# Patient Record
Sex: Female | Born: 1950
Health system: Southern US, Community
[De-identification: ages and names within clinical notes are randomized; demographics above are authoritative.]

## PROBLEM LIST (undated history)

## (undated) DIAGNOSIS — G4700 Insomnia, unspecified: Secondary | ICD-10-CM

## (undated) DIAGNOSIS — R011 Cardiac murmur, unspecified: Secondary | ICD-10-CM

## (undated) DIAGNOSIS — R112 Nausea with vomiting, unspecified: Secondary | ICD-10-CM

## (undated) DIAGNOSIS — K219 Gastro-esophageal reflux disease without esophagitis: Secondary | ICD-10-CM

## (undated) DIAGNOSIS — M1990 Unspecified osteoarthritis, unspecified site: Secondary | ICD-10-CM

## (undated) DIAGNOSIS — J45909 Unspecified asthma, uncomplicated: Secondary | ICD-10-CM

## (undated) DIAGNOSIS — I10 Essential (primary) hypertension: Secondary | ICD-10-CM

## (undated) DIAGNOSIS — E785 Hyperlipidemia, unspecified: Secondary | ICD-10-CM

## (undated) DIAGNOSIS — M8580 Other specified disorders of bone density and structure, unspecified site: Secondary | ICD-10-CM

## (undated) DIAGNOSIS — M810 Age-related osteoporosis without current pathological fracture: Secondary | ICD-10-CM

## (undated) DIAGNOSIS — C50919 Malignant neoplasm of unspecified site of unspecified female breast: Secondary | ICD-10-CM

## (undated) DIAGNOSIS — Z9889 Other specified postprocedural states: Secondary | ICD-10-CM

## (undated) DIAGNOSIS — Z8719 Personal history of other diseases of the digestive system: Secondary | ICD-10-CM

## (undated) DIAGNOSIS — L719 Rosacea, unspecified: Secondary | ICD-10-CM

## (undated) HISTORY — PX: ABDOMINAL HYSTERECTOMY: SHX81

## (undated) HISTORY — PX: OTHER SURGICAL HISTORY: SHX169

## (undated) HISTORY — PX: TONSILLECTOMY: SUR1361

## (undated) HISTORY — PX: COLONOSCOPY W/ POLYPECTOMY: SHX1380

---

## 1999-04-07 HISTORY — PX: BREAST LUMPECTOMY: SHX2

## 1999-04-23 ENCOUNTER — Other Ambulatory Visit: Admission: RE | Admit: 1999-04-23 | Discharge: 1999-04-23 | Payer: Self-pay | Admitting: *Deleted

## 1999-05-07 HISTORY — PX: BREAST LUMPECTOMY: SHX2

## 1999-06-01 ENCOUNTER — Encounter: Admission: RE | Admit: 1999-06-01 | Discharge: 1999-08-30 | Payer: Self-pay | Admitting: Radiation Oncology

## 1999-06-07 HISTORY — PX: OTHER SURGICAL HISTORY: SHX169

## 1999-12-01 ENCOUNTER — Encounter: Admission: RE | Admit: 1999-12-01 | Discharge: 2000-02-29 | Payer: Self-pay | Admitting: Radiation Oncology

## 2000-05-06 HISTORY — PX: PORT-A-CATH REMOVAL: SHX5289

## 2000-06-06 HISTORY — PX: OTHER SURGICAL HISTORY: SHX169

## 2004-05-03 ENCOUNTER — Ambulatory Visit: Payer: Self-pay | Admitting: Oncology

## 2005-04-27 ENCOUNTER — Ambulatory Visit: Payer: Self-pay | Admitting: Oncology

## 2007-12-03 ENCOUNTER — Encounter: Admission: RE | Admit: 2007-12-03 | Discharge: 2007-12-03 | Payer: Self-pay | Admitting: Gastroenterology

## 2010-06-27 ENCOUNTER — Encounter: Payer: Self-pay | Admitting: Podiatry

## 2010-07-07 HISTORY — PX: COLON SURGERY: SHX602

## 2012-05-08 ENCOUNTER — Telehealth: Payer: Self-pay

## 2012-05-08 ENCOUNTER — Other Ambulatory Visit: Payer: Self-pay

## 2012-05-08 DIAGNOSIS — K3189 Other diseases of stomach and duodenum: Secondary | ICD-10-CM

## 2012-05-08 NOTE — Telephone Encounter (Signed)
Left message on machine to call back  

## 2012-05-08 NOTE — Telephone Encounter (Signed)
Pt has been instructed and meds reviewed she will call with any questions or concerns

## 2012-05-09 ENCOUNTER — Encounter: Payer: Self-pay | Admitting: Gastroenterology

## 2012-05-17 ENCOUNTER — Encounter (HOSPITAL_COMMUNITY): Payer: Self-pay | Admitting: *Deleted

## 2012-05-17 NOTE — Pre-Procedure Instructions (Signed)
Your procedure is scheduled [id number] ,May 24, 2012 Report to [facility] Admitting [id number] Call this number if you have problems morning of your procedure:[phone]  Follow all bowel prep instructions per your doctor's orders.  Do not eat or drink anything after midnight the night before your procedure. You may brush your teeth, rinse out your mouth, but no water, no food, no chewing gum, no mints, no candies, no chewing tobacco.     Take these medicines the morning of your procedure with A SIP OF WATER:None   Please make arrangements for a responsible person to drive you home after the procedure. You cannot go home by cab/taxi. We recommend you have someone with you at home the first 24 hours after your procedure. Driver for procedure is daughter [name]  LEAVE ALL VALUABLES, JEWELRY, BILLFOLD AT HOME.  NO DENTURES, CONTACT LENSES ALLOWED IN THE ENDOSCOPY ROOM.   YOU MAY WEAR DEODORANT, PLEASE REMOVE ALL JEWELRY, WATCHES RINGS, BODY PIERCINGS AND LEAVE AT HOME.   WOMEN: NO MAKE-UP, LOTIONS PERFUMES

## 2012-05-24 ENCOUNTER — Ambulatory Visit (HOSPITAL_COMMUNITY)
Admission: RE | Admit: 2012-05-24 | Discharge: 2012-05-24 | Disposition: A | Discharge status: Home / Self Care | Payer: Managed Care, Other (non HMO) | Source: Ambulatory Visit | Attending: Gastroenterology | Admitting: Gastroenterology

## 2012-05-24 ENCOUNTER — Encounter (HOSPITAL_COMMUNITY): Payer: Self-pay | Admitting: Anesthesiology

## 2012-05-24 ENCOUNTER — Encounter (HOSPITAL_COMMUNITY)
Admission: RE | Disposition: A | Discharge status: Home / Self Care | Payer: Self-pay | Source: Ambulatory Visit | Attending: Gastroenterology

## 2012-05-24 ENCOUNTER — Ambulatory Visit (HOSPITAL_COMMUNITY): Payer: Managed Care, Other (non HMO) | Admitting: Anesthesiology

## 2012-05-24 ENCOUNTER — Encounter (HOSPITAL_COMMUNITY): Payer: Self-pay | Admitting: *Deleted

## 2012-05-24 DIAGNOSIS — K449 Diaphragmatic hernia without obstruction or gangrene: Secondary | ICD-10-CM

## 2012-05-24 DIAGNOSIS — K219 Gastro-esophageal reflux disease without esophagitis: Secondary | ICD-10-CM

## 2012-05-24 DIAGNOSIS — K319 Disease of stomach and duodenum, unspecified: Secondary | ICD-10-CM

## 2012-05-24 DIAGNOSIS — E785 Hyperlipidemia, unspecified: Secondary | ICD-10-CM | POA: Insufficient documentation

## 2012-05-24 DIAGNOSIS — I10 Essential (primary) hypertension: Secondary | ICD-10-CM | POA: Insufficient documentation

## 2012-05-24 DIAGNOSIS — R1310 Dysphagia, unspecified: Secondary | ICD-10-CM

## 2012-05-24 DIAGNOSIS — K3189 Other diseases of stomach and duodenum: Secondary | ICD-10-CM

## 2012-05-24 DIAGNOSIS — M810 Age-related osteoporosis without current pathological fracture: Secondary | ICD-10-CM | POA: Insufficient documentation

## 2012-05-24 HISTORY — DX: Cardiac murmur, unspecified: R01.1

## 2012-05-24 HISTORY — DX: Other specified disorders of bone density and structure, unspecified site: M85.80

## 2012-05-24 HISTORY — DX: Other specified postprocedural states: Z98.890

## 2012-05-24 HISTORY — DX: Essential (primary) hypertension: I10

## 2012-05-24 HISTORY — DX: Hyperlipidemia, unspecified: E78.5

## 2012-05-24 HISTORY — PX: EUS: SHX5427

## 2012-05-24 HISTORY — DX: Age-related osteoporosis without current pathological fracture: M81.0

## 2012-05-24 HISTORY — DX: Insomnia, unspecified: G47.00

## 2012-05-24 HISTORY — DX: Rosacea, unspecified: L71.9

## 2012-05-24 HISTORY — DX: Personal history of other diseases of the digestive system: Z87.19

## 2012-05-24 HISTORY — DX: Gastro-esophageal reflux disease without esophagitis: K21.9

## 2012-05-24 HISTORY — DX: Other diseases of stomach and duodenum: K31.89

## 2012-05-24 HISTORY — DX: Nausea with vomiting, unspecified: R11.2

## 2012-05-24 HISTORY — DX: Unspecified osteoarthritis, unspecified site: M19.90

## 2012-05-24 HISTORY — DX: Unspecified asthma, uncomplicated: J45.909

## 2012-05-24 SURGERY — UPPER ENDOSCOPIC ULTRASOUND (EUS) LINEAR
Anesthesia: Monitor Anesthesia Care

## 2012-05-24 MED ORDER — LACTATED RINGERS IV SOLN
INTRAVENOUS | Status: DC
  Administered 2012-05-24: 1000 mL via INTRAVENOUS

## 2012-05-24 MED ORDER — FENTANYL CITRATE 0.05 MG/ML IJ SOLN
INTRAMUSCULAR | Status: DC | PRN
  Administered 2012-05-24: 50 ug via INTRAVENOUS

## 2012-05-24 MED ORDER — LACTATED RINGERS IV SOLN: INTRAVENOUS | Status: DC

## 2012-05-24 MED ORDER — PROMETHAZINE HCL 25 MG/ML IJ SOLN: 6.2500 mg | INTRAMUSCULAR | Status: DC | PRN

## 2012-05-24 MED ORDER — PROPOFOL 10 MG/ML IV EMUL
INTRAVENOUS | Status: DC | PRN
  Administered 2012-05-24: 70 ug/kg/min via INTRAVENOUS
  Administered 2012-05-24: 100 ug/kg/min via INTRAVENOUS

## 2012-05-24 MED ORDER — KETAMINE HCL 10 MG/ML IJ SOLN
INTRAMUSCULAR | Status: DC | PRN
  Administered 2012-05-24: 10 mg via INTRAVENOUS

## 2012-05-24 MED ORDER — MIDAZOLAM HCL 5 MG/5ML IJ SOLN
INTRAMUSCULAR | Status: DC | PRN
  Administered 2012-05-24 (×2): 1 mg via INTRAVENOUS

## 2012-05-24 MED ORDER — ONDANSETRON HCL 4 MG/2ML IJ SOLN
INTRAMUSCULAR | Status: DC | PRN
  Administered 2012-05-24: 4 mg via INTRAVENOUS

## 2012-05-24 MED ORDER — LACTATED RINGERS IV SOLN
INTRAVENOUS | Status: DC | PRN
  Administered 2012-05-24: 08:00:00 via INTRAVENOUS

## 2012-05-24 NOTE — Anesthesia Preprocedure Evaluation (Addendum)
Anesthesia Evaluation  Patient identified by MRN, date of birth, ID band Patient awake    Reviewed: Allergy & Precautions, H&P , NPO status , Patient's Chart, lab work & pertinent test results  History of Anesthesia Complications (+) PONV  Airway Mallampati: II TM Distance: >3 FB Neck ROM: Full    Dental  (+) Caps and Teeth Intact,    Pulmonary asthma ,    Pulmonary exam normal       Cardiovascular hypertension, Pt. on medications Rhythm:Regular Rate:Normal     Neuro/Psych negative neurological ROS  negative psych ROS   GI/Hepatic Neg liver ROS, hiatal hernia, GERD-  ,  Endo/Other  negative endocrine ROS  Renal/GU negative Renal ROS  negative genitourinary   Musculoskeletal negative musculoskeletal ROS (+)   Abdominal   Peds  Hematology negative hematology ROS (+)   Anesthesia Other Findings   Reproductive/Obstetrics negative OB ROS                          Anesthesia Physical Anesthesia Plan  ASA: II  Anesthesia Plan: MAC   Post-op Pain Management:    Induction: Intravenous  Airway Management Planned: Nasal Cannula  Additional Equipment:   Intra-op Plan:   Post-operative Plan:   Informed Consent: I have reviewed the patients History and Physical, chart, labs and discussed the procedure including the risks, benefits and alternatives for the proposed anesthesia with the patient or authorized representative who has indicated his/her understanding and acceptance.   Dental advisory given  Plan Discussed with: Anesthesiologist  Anesthesia Plan Comments:         Anesthesia Quick Evaluation

## 2012-05-24 NOTE — H&P (Signed)
  HPI: This is a 61 yo woman with incidentally noted gastric mass (subepith) last month EGD, Dr. Vivi Martens    Past Medical History  Diagnosis Date  . Osteoporosis   . Osteopenia   . Hypertension   . PONV (postoperative nausea and vomiting)   . Heart murmur     not MVP no treatment  . Asthma     allergic  . Insomnia   . GERD (gastroesophageal reflux disease)   . H/O hiatal hernia   . Arthritis     joints  . Hyperlipidemia   . Rosacea     neck,cheeks    Past Surgical History  Procedure Date  . Abdominal hysterectomy   . Bladder tack   . Blader tack   . Tonsillectomy   . Breast lumpectomy 04-1999  . Breast lumpectomy 05-1999  . Lumpectomy 07-2000 2002  . Port-a-cath removal 05-2000  . Port a cath insertion 2001  . Colon surgery 07-2010  . Colonoscopy w/ polypectomy   . Edg  with polypectomy (gastric polyps)     Current Facility-Administered Medications  Medication Dose Route Frequency Provider Last Rate Last Dose  . lactated ringers infusion   Intravenous Continuous Rachael Fee, MD        Allergies as of 05/08/2012  . (Not on File)    History reviewed. No pertinent family history.  History   Social History  . Marital Status: Widowed    Spouse Name: N/A    Number of Children: N/A  . Years of Education: N/A   Occupational History  . Not on file.   Social History Main Topics  . Smoking status: Never Smoker   . Smokeless tobacco: Never Used  . Alcohol Use: No  . Drug Use: No  . Sexually Active:    Other Topics Concern  . Not on file   Social History Narrative  . No narrative on file      Physical Exam: There were no vitals taken for this visit. Constitutional: generally well-appearing Psychiatric: alert and oriented x3 Abdomen: soft, nontender, nondistended, no obvious ascites, no peritoneal signs, normal bowel sounds     Assessment and plan: 61 y.o. female with gastric mass  For EUS today

## 2012-05-24 NOTE — Anesthesia Postprocedure Evaluation (Signed)
Anesthesia Post Note  Patient: [name]  Procedure(s) Performed: Procedure(s) (LRB): UPPER ENDOSCOPIC ULTRASOUND (EUS) LINEAR (N/A)  Anesthesia type: MAC  Patient location: PACU  Post pain: Pain level controlled  Post assessment: Post-op Vital signs reviewed  Last Vitals:  Filed Vitals:   05/24/12 0940  BP: 151/83  Pulse:   Temp:   Resp: 14    Post vital signs: Reviewed  Level of consciousness: sedated  Complications: No apparent anesthesia complications

## 2012-05-24 NOTE — Op Note (Signed)
[facility] [address] [place], [address]   ENDOSCOPIC ULTRASOUND PROCEDURE REPORT  PATIENT: [name], [name]  MR#: [id number] BIRTHDATE: [birth year]  GENDER: Female ENDOSCOPIST: Rachael Fee, MD REFERRED BY:  Laurell Roof, M.D. PROCEDURE DATE:  05/24/2012 PROCEDURE:   Upper EUS ASA CLASS:      Class II INDICATIONS:   recently noted gastric subepithelial mass (EGD Dr. Jennye Boroughs last month); + intermittent dyspahgia. MEDICATIONS: MAC sedation, administered by CRNA  DESCRIPTION OF PROCEDURE:   After the risks benefits and alternatives of the procedure were  explained, informed consent was obtained. The patient was then placed in the left, lateral, decubitus postion and IV sedation was administered. Throughout the procedure, the patients blood pressure, pulse and oxygen saturations were monitored continuously.  Under direct visualization, the Pentax Radial EUS [id number]  endoscope was introduced through the mouth  and advanced to the second portion of the duodenum .  Water was used as necessary to provide an acoustic interface.  Upon completion of the imaging, water was removed and the patient was sent to the recovery room in satisfactory condition.  Endoscopic findings: 1. Focal peptic stricture vs. thick Schatzki's ring at GE junction 2. 1-2cm subepithelial bulging inward of hiatal hernia segment gastric mucosa. Normal overlying mucosa. 3. 4cm hiatal hernia. 4. Otherwise normal UGI examination  EUS findings: 1. The subepithelial bulging above corresponds with a 1.3cm (maximum dimension) discrete, hypoechoic mass which communicates with the muscularis propria layer of the proximal gastric wall. 2. No paraesophageal, perigastric adenopathy. 3. Limited views of pancreas, liver, spleen were all normal  Impression: 1.3cm (maximum dimension) hypoechoic subepithelial lesion that communicates with the muscularis propria layer of proximal  gastric mucosa (within 4cm hiatal hernia segment). This is most likely a GIST lesion and at it's size (<2cm) it is very unlikely to be a problem for her).  I suggest repeat EUS in 12 months to check for interval growth, change in the lesion.  Her dysphagia symptoms are most likely due to the persistent GE junction focal stricture.  She was not consented for dilation today and so I did not perform dilation, but I will communicate this with Dr. Jennye Boroughs.   _______________________________ eSigned:  Rachael Fee, MD 05/24/2012 9:24 AM

## 2012-05-24 NOTE — Transfer of Care (Signed)
Immediate Anesthesia Transfer of Care Note  Patient: [name]  Procedure(s) Performed: Procedure(s) (LRB) with comments: UPPER ENDOSCOPIC ULTRASOUND (EUS) LINEAR (N/A)  Patient Location: PACU and Endoscopy Unit  Anesthesia Type:MAC  Level of Consciousness: awake, alert , oriented and patient cooperative  Airway & Oxygen Therapy: Patient Spontanous Breathing and Patient connected to nasal cannula oxygen  Post-op Assessment: Report given to PACU RN, Post -op Vital signs reviewed and stable and Patient moving all extremities  Post vital signs: Reviewed and stable  Complications: No apparent anesthesia complications

## 2012-05-25 ENCOUNTER — Encounter (HOSPITAL_COMMUNITY): Payer: Self-pay | Admitting: Gastroenterology

## 2012-07-21 ENCOUNTER — Other Ambulatory Visit: Payer: Self-pay

## 2013-03-11 ENCOUNTER — Telehealth: Payer: Self-pay

## 2013-03-11 NOTE — Telephone Encounter (Signed)
Left message on machine to call back  

## 2013-03-11 NOTE — Telephone Encounter (Signed)
Pt needs EUS for gastric mass recall Dec.

## 2013-03-13 NOTE — Telephone Encounter (Signed)
Left message on machine to call back  

## 2013-03-14 NOTE — Telephone Encounter (Signed)
Left message on machine to call back unable to reach pt letter mailed.  

## 2013-03-25 ENCOUNTER — Telehealth: Payer: Self-pay | Admitting: Gastroenterology

## 2013-03-27 NOTE — Telephone Encounter (Signed)
Left message on machine to call back  

## 2013-04-11 ENCOUNTER — Other Ambulatory Visit: Payer: Self-pay

## 2014-03-21 ENCOUNTER — Other Ambulatory Visit: Payer: Self-pay

## 2014-10-09 ENCOUNTER — Ambulatory Visit (INDEPENDENT_AMBULATORY_CARE_PROVIDER_SITE_OTHER): Payer: Managed Care, Other (non HMO)

## 2014-10-09 ENCOUNTER — Ambulatory Visit (INDEPENDENT_AMBULATORY_CARE_PROVIDER_SITE_OTHER): Payer: Managed Care, Other (non HMO) | Admitting: Podiatrist

## 2014-10-09 ENCOUNTER — Ambulatory Visit: Payer: Self-pay | Admitting: Podiatrist

## 2014-10-09 ENCOUNTER — Encounter: Payer: Self-pay | Admitting: Podiatrist

## 2014-10-09 VITALS — BP 133/75 | HR 85 | Resp 18

## 2014-10-09 DIAGNOSIS — M129 Arthropathy, unspecified: Secondary | ICD-10-CM

## 2014-10-09 DIAGNOSIS — R52 Pain, unspecified: Secondary | ICD-10-CM | POA: Diagnosis not present

## 2014-10-09 DIAGNOSIS — M19079 Primary osteoarthritis, unspecified ankle and foot: Secondary | ICD-10-CM

## 2014-10-09 NOTE — Progress Notes (Signed)
   Subjective:    Patient ID: [name], female    DOB: 06/03/1951, 64 y.o.   MRN: [id number]  HPI my left foot and ankle are hurting and i have been doing some exercises and using bio-freeze and i walk at work and i used ice and sore and tender and has been going on for about 6 months    Review of Systems  All other systems reviewed and are negative.      Objective:   Physical Exam Patient is awake, alert, and oriented x 3.  In no acute distress.  Vascular status is intact with palpable pedal pulses at 2/4 DP and PT bilateral and capillary refill time within normal limits. Neurological sensation is also intact bilaterally via Semmes Weinstein monofilament at 5/5 sites. Light touch, vibratory sensation, Achilles tendon reflex is intact. Dermatological exam reveals skin color, turger and texture as normal. No open lesions present.  Musculature intact with dorsiflexion, plantarflexion, inversion, eversion.  Pain in the midfoot of the left foot is noted. Pain along the sinus tarsi is also noted. On x-ray arthritic changes are seen at the midfoot. Upon questioning the patient states that she broke this foot several times. No sign of continued fracture or disc location is noted.     Assessment & Plan:  Arthritic inflammation, left foot midfoot pain  Injected the area of maximal tenderness with Kenalog and Marcaine plain without couple location. This does not help her she will call. Discussed the positive benefit of some over-the-counter inserts which she obtained at today's visit. She also got some formula 3 today she's been using that on her nails from the past.

## 2014-10-09 NOTE — Patient Instructions (Signed)
Arthritis, Nonspecific °Arthritis is inflammation of a joint. This usually means pain, redness, warmth or swelling are present. One or more joints may be involved. There are a number of types of arthritis. Your caregiver may not be able to tell what type of arthritis you have right away. °CAUSES  °The most common cause of arthritis is the wear and tear on the joint (osteoarthritis). This causes damage to the cartilage, which can break down over time. The knees, hips, back and neck are most often affected by this type of arthritis. °Other types of arthritis and common causes of joint pain include: °· Sprains and other injuries near the joint. Sometimes minor sprains and injuries cause pain and swelling that develop hours later. °· Rheumatoid arthritis. This affects hands, feet and knees. It usually affects both sides of your body at the same time. It is often associated with chronic ailments, fever, weight loss and general weakness. °· Crystal arthritis. Gout and pseudo gout can cause occasional acute severe pain, redness and swelling in the foot, ankle, or knee. °· Infectious arthritis. Bacteria can get into a joint through a break in overlying skin. This can cause infection of the joint. Bacteria and viruses can also spread through the blood and affect your joints. °· Drug, infectious and allergy reactions. Sometimes joints can become mildly painful and slightly swollen with these types of illnesses. °SYMPTOMS  °· Pain is the main symptom. °· Your joint or joints can also be red, swollen and warm or hot to the touch. °· You may have a fever with certain types of arthritis, or even feel overall ill. °· The joint with arthritis will hurt with movement. Stiffness is present with some types of arthritis. °DIAGNOSIS  °Your caregiver will suspect arthritis based on your description of your symptoms and on your exam. Testing may be needed to find the type of arthritis: °· Blood and sometimes urine tests. °· X-ray tests  and sometimes CT or MRI scans. °· Removal of fluid from the joint (arthrocentesis) is done to check for bacteria, crystals or other causes. Your caregiver (or a specialist) will numb the area over the joint with a local anesthetic, and use a needle to remove joint fluid for examination. This procedure is only minimally uncomfortable. °· Even with these tests, your caregiver may not be able to tell what kind of arthritis you have. Consultation with a specialist (rheumatologist) may be helpful. °TREATMENT  °Your caregiver will discuss with you treatment specific to your type of arthritis. If the specific type cannot be determined, then the following general recommendations may apply. °Treatment of severe joint pain includes: °· Rest. °· Elevation. °· Anti-inflammatory medication (for example, ibuprofen) may be prescribed. Avoiding activities that cause increased pain. °· Only take over-the-counter or prescription medicines for pain and discomfort as recommended by your caregiver. °· Cold packs over an inflamed joint may be used for 10 to 15 minutes every hour. Hot packs sometimes feel better, but do not use overnight. Do not use hot packs if you are diabetic without your caregiver's permission. °· A cortisone shot into arthritic joints may help reduce pain and swelling. °· Any acute arthritis that gets worse over the next 1 to 2 days needs to be looked at to be sure there is no joint infection. °Long-term arthritis treatment involves modifying activities and lifestyle to reduce joint stress jarring. This can include weight loss. Also, exercise is needed to nourish the joint cartilage and remove waste. This helps keep the muscles   around the joint strong. °HOME CARE INSTRUCTIONS  °· Do not take aspirin to relieve pain if gout is suspected. This elevates uric acid levels. °· Only take over-the-counter or prescription medicines for pain, discomfort or fever as directed by your caregiver. °· Rest the joint as much as  possible. °· If your joint is swollen, keep it elevated. °· Use crutches if the painful joint is in your leg. °· Drinking plenty of fluids may help for certain types of arthritis. °· Follow your caregiver's dietary instructions. °· Try low-impact exercise such as: °¨ Swimming. °¨ Water aerobics. °¨ Biking. °¨ Walking. °· Morning stiffness is often relieved by a warm shower. °· Put your joints through regular range-of-motion. °SEEK MEDICAL CARE IF:  °· You do not feel better in 24 hours or are getting worse. °· You have side effects to medications, or are not getting better with treatment. °SEEK IMMEDIATE MEDICAL CARE IF:  °· You have a fever. °· You develop severe joint pain, swelling or redness. °· Many joints are involved and become painful and swollen. °· There is severe back pain and/or leg weakness. °· You have loss of bowel or bladder control. °Document Released: 06/30/2004 Document Revised: 08/15/2011 Document Reviewed: 07/16/2008 °Exit[employer]® Patient Information ©2015 [employer]. This information is not intended to replace advice given to you by your health care provider. Make sure you discuss any questions you have with your health care provider. ° °

## 2015-11-05 ENCOUNTER — Ambulatory Visit (INDEPENDENT_AMBULATORY_CARE_PROVIDER_SITE_OTHER): Payer: Managed Care, Other (non HMO)

## 2015-11-05 ENCOUNTER — Ambulatory Visit (INDEPENDENT_AMBULATORY_CARE_PROVIDER_SITE_OTHER): Payer: Managed Care, Other (non HMO) | Admitting: Sports Medicine

## 2015-11-05 ENCOUNTER — Encounter: Payer: Self-pay | Admitting: Sports Medicine

## 2015-11-05 DIAGNOSIS — M778 Other enthesopathies, not elsewhere classified: Secondary | ICD-10-CM

## 2015-11-05 DIAGNOSIS — M79672 Pain in left foot: Secondary | ICD-10-CM

## 2015-11-05 DIAGNOSIS — M7752 Other enthesopathy of left foot: Secondary | ICD-10-CM | POA: Diagnosis not present

## 2015-11-05 DIAGNOSIS — M19072 Primary osteoarthritis, left ankle and foot: Secondary | ICD-10-CM | POA: Diagnosis not present

## 2015-11-05 DIAGNOSIS — M779 Enthesopathy, unspecified: Secondary | ICD-10-CM

## 2015-11-05 MED ORDER — TRIAMCINOLONE ACETONIDE 10 MG/ML IJ SUSP: 10.0000 mg | Freq: Once | INTRAMUSCULAR | Status: DC

## 2015-11-05 NOTE — Progress Notes (Signed)
Patient ID: [name], female   DOB: 26-Jun-1950, 65 y.o.   MRN: [id number] Subjective: [name] is a 65 y.o. female patient who presents to office for evaluation of left foot pain. Patient complains of pain that has returned since January with occasional stiffness throbbing and achiness at ankle, midfoot and base of second toe. States that she has tried Aspercreme, SalonPas, BioFreeze, and change in shoes; reports that this helps. Patient denies any other pedal complaints. Denies injury/trip/fall/sprain/any causative factors.   Patient Active Problem List   Diagnosis Date Noted  . Gastric mass 05/24/2012    Current Outpatient Prescriptions on File Prior to Visit  Medication Sig Dispense Refill  . amitriptyline (ELAVIL) 25 MG tablet Take 25 mg by mouth 1 day or 1 dose.    Marland Kitchen atorvastatin (LIPITOR) 20 MG tablet     . calcium carbonate (OS-CAL) 600 MG TABS Take 600 mg by mouth 2 (two) times daily with a meal. Vitamin D 400 mg    . cetirizine (ZYRTEC) 10 MG tablet Take 10 mg by mouth as needed.    Marland Kitchen losartan-hydrochlorothiazide (HYZAAR) 50-12.5 MG per tablet Take 1 tablet by mouth daily.    . meloxicam (MOBIC) 15 MG tablet Take 15 mg by mouth daily.    . Multiple Vitamin (MULTIVITAMIN) tablet Take 1 tablet by mouth daily.    . pantoprazole (PROTONIX) 40 MG tablet Take 40 mg by mouth daily.    . polyethylene glycol powder (GLYCOLAX/MIRALAX) powder     . pravastatin (PRAVACHOL) 40 MG tablet Take 40 mg by mouth daily.    . raloxifene (EVISTA) 60 MG tablet Take 60 mg by mouth daily.    . TOPROL XL 25 MG 24 hr tablet      No current facility-administered medications on file prior to visit.    Allergies  Allergen Reactions  . Morphine And Related Nausea Only    Objective:  General: Alert and oriented x3 in no acute distress  Dermatology: No open lesions bilateral lower extremities, no webspace macerations, no ecchymosis bilateral, all nails x 10 are well  manicured.  Vascular: Dorsalis Pedis and Posterior Tibial pedal pulses palpable, Capillary Fill Time 3 seconds,Scant pedal hair growth bilateral, no edema bilateral lower extremities, Temperature gradient within normal limits.  Neurology: Johney Maine sensation intact via light touch bilateral, Protective sensation intact  with Thornell Mule Monofilament to all pedal sites, Position sense intact, vibratory intact bilateral, Deep tendon reflexes within normal limits bilateral, No babinski sign present bilateral. (- )Tinels sign bilateral.   Musculoskeletal: Mild tenderness with palpation and bony spurs at ankle, midfoot and second metatarsophalangeal joint, left foot, No pain with calf compression bilateral. Ankle and pedal joint range of motion is within normal limits except at these areas where there is some limitation and radiating pain. Strength within normal limits in all groups bilateral.   Xrays  Left Foot   Impression: Mild decrease osseous mineralization. There is significant arthritic changes at the ankle, midfoot and mild joint space narrowing of the second metatarsophalangeal joint. There is midtarsal breach and mild hammertoe deformity, posterior and inferior calcaneal spur, small in nature. No fracture, dislocation, soft tissue margins within normal limits. No foreign bodies.   Assessment and Plan: Problem List Items Addressed This Visit    None    Visit Diagnoses    Left foot pain    -  Primary    Relevant Medications    triamcinolone acetonide (KENALOG) 10 MG/ML injection 10 mg  Other Relevant Orders    DG Foot 2 Views Left    Osteoarthritis of ankle and foot, left        Relevant Medications    triamcinolone acetonide (KENALOG) 10 MG/ML injection 10 mg    Capsulitis of foot, left        Relevant Medications    triamcinolone acetonide (KENALOG) 10 MG/ML injection 10 mg      -Complete examination performed -Xrays reviewed -Discussed treatement optionsFor likely pain  secondary to arthritis capsulitis with the point of maximal tenderness at today's exam at left second metatarsophalangeal joint -After oral consent and aseptic prep, injected a mixture containing 1 ml of 2%  plain lidocaine, 1 ml 0.5% plain marcaine, 0.5 ml of kenalog 10 and 0.5 ml of dexamethasone phosphate into 2nd MTPJ on left without complication. Post-injection care discussed with patient.  -Rx power steps -Continue Mobic -Patient to return to office as needed or sooner if condition worsens.  Landis Martins, DPM

## 2016-11-09 DIAGNOSIS — E010 Iodine-deficiency related diffuse (endemic) goiter: Secondary | ICD-10-CM | POA: Diagnosis not present

## 2016-11-09 DIAGNOSIS — Z9221 Personal history of antineoplastic chemotherapy: Secondary | ICD-10-CM | POA: Diagnosis not present

## 2016-11-09 DIAGNOSIS — Z923 Personal history of irradiation: Secondary | ICD-10-CM | POA: Diagnosis not present

## 2016-11-09 DIAGNOSIS — M810 Age-related osteoporosis without current pathological fracture: Secondary | ICD-10-CM | POA: Diagnosis not present

## 2016-11-09 DIAGNOSIS — Z853 Personal history of malignant neoplasm of breast: Secondary | ICD-10-CM | POA: Diagnosis not present

## 2018-06-17 DIAGNOSIS — J189 Pneumonia, unspecified organism: Secondary | ICD-10-CM | POA: Diagnosis not present

## 2018-06-23 DIAGNOSIS — M1990 Unspecified osteoarthritis, unspecified site: Secondary | ICD-10-CM | POA: Diagnosis not present

## 2018-06-23 DIAGNOSIS — K219 Gastro-esophageal reflux disease without esophagitis: Secondary | ICD-10-CM | POA: Diagnosis not present

## 2018-06-23 DIAGNOSIS — E785 Hyperlipidemia, unspecified: Secondary | ICD-10-CM | POA: Diagnosis not present

## 2018-06-23 DIAGNOSIS — I10 Essential (primary) hypertension: Secondary | ICD-10-CM | POA: Diagnosis not present

## 2018-06-23 DIAGNOSIS — C50919 Malignant neoplasm of unspecified site of unspecified female breast: Secondary | ICD-10-CM | POA: Diagnosis not present

## 2018-06-23 DIAGNOSIS — G609 Hereditary and idiopathic neuropathy, unspecified: Secondary | ICD-10-CM | POA: Diagnosis not present

## 2018-06-23 DIAGNOSIS — R7303 Prediabetes: Secondary | ICD-10-CM | POA: Diagnosis not present

## 2018-06-27 DIAGNOSIS — M810 Age-related osteoporosis without current pathological fracture: Secondary | ICD-10-CM | POA: Diagnosis not present

## 2018-06-27 DIAGNOSIS — D473 Essential (hemorrhagic) thrombocythemia: Secondary | ICD-10-CM | POA: Diagnosis not present

## 2018-06-27 DIAGNOSIS — Z853 Personal history of malignant neoplasm of breast: Secondary | ICD-10-CM | POA: Diagnosis not present

## 2018-07-23 DIAGNOSIS — M25552 Pain in left hip: Secondary | ICD-10-CM | POA: Diagnosis not present

## 2018-07-23 DIAGNOSIS — M1990 Unspecified osteoarthritis, unspecified site: Secondary | ICD-10-CM | POA: Diagnosis not present

## 2018-07-23 DIAGNOSIS — K219 Gastro-esophageal reflux disease without esophagitis: Secondary | ICD-10-CM | POA: Diagnosis not present

## 2018-07-23 DIAGNOSIS — C50919 Malignant neoplasm of unspecified site of unspecified female breast: Secondary | ICD-10-CM | POA: Diagnosis not present

## 2018-07-31 DIAGNOSIS — C50919 Malignant neoplasm of unspecified site of unspecified female breast: Secondary | ICD-10-CM | POA: Diagnosis not present

## 2018-07-31 DIAGNOSIS — M25552 Pain in left hip: Secondary | ICD-10-CM | POA: Diagnosis not present

## 2018-07-31 DIAGNOSIS — M1990 Unspecified osteoarthritis, unspecified site: Secondary | ICD-10-CM | POA: Diagnosis not present

## 2018-07-31 DIAGNOSIS — K219 Gastro-esophageal reflux disease without esophagitis: Secondary | ICD-10-CM | POA: Diagnosis not present

## 2018-10-02 DIAGNOSIS — K219 Gastro-esophageal reflux disease without esophagitis: Secondary | ICD-10-CM | POA: Diagnosis not present

## 2018-10-02 DIAGNOSIS — J399 Disease of upper respiratory tract, unspecified: Secondary | ICD-10-CM | POA: Diagnosis not present

## 2018-10-02 DIAGNOSIS — M1990 Unspecified osteoarthritis, unspecified site: Secondary | ICD-10-CM | POA: Diagnosis not present

## 2018-10-02 DIAGNOSIS — C50919 Malignant neoplasm of unspecified site of unspecified female breast: Secondary | ICD-10-CM | POA: Diagnosis not present

## 2018-10-16 DIAGNOSIS — K219 Gastro-esophageal reflux disease without esophagitis: Secondary | ICD-10-CM | POA: Diagnosis not present

## 2018-10-16 DIAGNOSIS — C50919 Malignant neoplasm of unspecified site of unspecified female breast: Secondary | ICD-10-CM | POA: Diagnosis not present

## 2018-10-16 DIAGNOSIS — G609 Hereditary and idiopathic neuropathy, unspecified: Secondary | ICD-10-CM | POA: Diagnosis not present

## 2018-10-16 DIAGNOSIS — M1990 Unspecified osteoarthritis, unspecified site: Secondary | ICD-10-CM | POA: Diagnosis not present

## 2018-10-23 DIAGNOSIS — J411 Mucopurulent chronic bronchitis: Secondary | ICD-10-CM | POA: Diagnosis not present

## 2018-11-15 DIAGNOSIS — E785 Hyperlipidemia, unspecified: Secondary | ICD-10-CM | POA: Diagnosis not present

## 2018-11-15 DIAGNOSIS — R7303 Prediabetes: Secondary | ICD-10-CM | POA: Diagnosis not present

## 2018-11-15 DIAGNOSIS — I10 Essential (primary) hypertension: Secondary | ICD-10-CM | POA: Diagnosis not present

## 2018-11-15 DIAGNOSIS — M1990 Unspecified osteoarthritis, unspecified site: Secondary | ICD-10-CM | POA: Diagnosis not present

## 2018-11-15 DIAGNOSIS — C50919 Malignant neoplasm of unspecified site of unspecified female breast: Secondary | ICD-10-CM | POA: Diagnosis not present

## 2018-11-15 DIAGNOSIS — M25511 Pain in right shoulder: Secondary | ICD-10-CM | POA: Diagnosis not present

## 2018-11-15 DIAGNOSIS — K219 Gastro-esophageal reflux disease without esophagitis: Secondary | ICD-10-CM | POA: Diagnosis not present

## 2018-11-29 DIAGNOSIS — Z9181 History of falling: Secondary | ICD-10-CM | POA: Diagnosis not present

## 2018-11-29 DIAGNOSIS — C50919 Malignant neoplasm of unspecified site of unspecified female breast: Secondary | ICD-10-CM | POA: Diagnosis not present

## 2018-11-29 DIAGNOSIS — Z1331 Encounter for screening for depression: Secondary | ICD-10-CM | POA: Diagnosis not present

## 2018-11-29 DIAGNOSIS — K219 Gastro-esophageal reflux disease without esophagitis: Secondary | ICD-10-CM | POA: Diagnosis not present

## 2018-11-29 DIAGNOSIS — M1990 Unspecified osteoarthritis, unspecified site: Secondary | ICD-10-CM | POA: Diagnosis not present

## 2018-11-29 DIAGNOSIS — M25511 Pain in right shoulder: Secondary | ICD-10-CM | POA: Diagnosis not present

## 2018-12-13 DIAGNOSIS — M25552 Pain in left hip: Secondary | ICD-10-CM | POA: Diagnosis not present

## 2018-12-13 DIAGNOSIS — M1990 Unspecified osteoarthritis, unspecified site: Secondary | ICD-10-CM | POA: Diagnosis not present

## 2018-12-13 DIAGNOSIS — K219 Gastro-esophageal reflux disease without esophagitis: Secondary | ICD-10-CM | POA: Diagnosis not present

## 2018-12-13 DIAGNOSIS — C50919 Malignant neoplasm of unspecified site of unspecified female breast: Secondary | ICD-10-CM | POA: Diagnosis not present

## 2018-12-27 DIAGNOSIS — I10 Essential (primary) hypertension: Secondary | ICD-10-CM | POA: Diagnosis not present

## 2018-12-27 DIAGNOSIS — M25552 Pain in left hip: Secondary | ICD-10-CM | POA: Diagnosis not present

## 2018-12-27 DIAGNOSIS — C50919 Malignant neoplasm of unspecified site of unspecified female breast: Secondary | ICD-10-CM | POA: Diagnosis not present

## 2018-12-27 DIAGNOSIS — M1990 Unspecified osteoarthritis, unspecified site: Secondary | ICD-10-CM | POA: Diagnosis not present

## 2019-01-02 DIAGNOSIS — K219 Gastro-esophageal reflux disease without esophagitis: Secondary | ICD-10-CM | POA: Diagnosis not present

## 2019-01-02 DIAGNOSIS — M1990 Unspecified osteoarthritis, unspecified site: Secondary | ICD-10-CM | POA: Diagnosis not present

## 2019-01-02 DIAGNOSIS — C50919 Malignant neoplasm of unspecified site of unspecified female breast: Secondary | ICD-10-CM | POA: Diagnosis not present

## 2019-01-02 DIAGNOSIS — I10 Essential (primary) hypertension: Secondary | ICD-10-CM | POA: Diagnosis not present

## 2019-01-12 DIAGNOSIS — J0100 Acute maxillary sinusitis, unspecified: Secondary | ICD-10-CM | POA: Diagnosis not present

## 2019-01-12 DIAGNOSIS — I10 Essential (primary) hypertension: Secondary | ICD-10-CM | POA: Diagnosis not present

## 2019-01-16 DIAGNOSIS — M1990 Unspecified osteoarthritis, unspecified site: Secondary | ICD-10-CM | POA: Diagnosis not present

## 2019-01-16 DIAGNOSIS — I10 Essential (primary) hypertension: Secondary | ICD-10-CM | POA: Diagnosis not present

## 2019-01-16 DIAGNOSIS — C50919 Malignant neoplasm of unspecified site of unspecified female breast: Secondary | ICD-10-CM | POA: Diagnosis not present

## 2019-01-16 DIAGNOSIS — K219 Gastro-esophageal reflux disease without esophagitis: Secondary | ICD-10-CM | POA: Diagnosis not present

## 2019-02-01 DIAGNOSIS — R600 Localized edema: Secondary | ICD-10-CM | POA: Diagnosis not present

## 2019-02-01 DIAGNOSIS — M1990 Unspecified osteoarthritis, unspecified site: Secondary | ICD-10-CM | POA: Diagnosis not present

## 2019-02-01 DIAGNOSIS — M25552 Pain in left hip: Secondary | ICD-10-CM | POA: Diagnosis not present

## 2019-02-01 DIAGNOSIS — I10 Essential (primary) hypertension: Secondary | ICD-10-CM | POA: Diagnosis not present

## 2019-02-01 DIAGNOSIS — Z139 Encounter for screening, unspecified: Secondary | ICD-10-CM | POA: Diagnosis not present

## 2019-02-13 DIAGNOSIS — M25552 Pain in left hip: Secondary | ICD-10-CM | POA: Diagnosis not present

## 2019-02-13 DIAGNOSIS — R7303 Prediabetes: Secondary | ICD-10-CM | POA: Diagnosis not present

## 2019-02-13 DIAGNOSIS — M1990 Unspecified osteoarthritis, unspecified site: Secondary | ICD-10-CM | POA: Diagnosis not present

## 2019-02-13 DIAGNOSIS — Z23 Encounter for immunization: Secondary | ICD-10-CM | POA: Diagnosis not present

## 2019-02-13 DIAGNOSIS — R600 Localized edema: Secondary | ICD-10-CM | POA: Diagnosis not present

## 2019-02-13 DIAGNOSIS — I10 Essential (primary) hypertension: Secondary | ICD-10-CM | POA: Diagnosis not present

## 2019-02-15 DIAGNOSIS — H25813 Combined forms of age-related cataract, bilateral: Secondary | ICD-10-CM | POA: Diagnosis not present

## 2019-02-15 DIAGNOSIS — H5461 Unqualified visual loss, right eye, normal vision left eye: Secondary | ICD-10-CM | POA: Diagnosis not present

## 2019-03-04 DIAGNOSIS — E785 Hyperlipidemia, unspecified: Secondary | ICD-10-CM | POA: Diagnosis not present

## 2019-03-04 DIAGNOSIS — I10 Essential (primary) hypertension: Secondary | ICD-10-CM | POA: Diagnosis not present

## 2019-03-04 DIAGNOSIS — R7303 Prediabetes: Secondary | ICD-10-CM | POA: Diagnosis not present

## 2019-03-04 DIAGNOSIS — M1990 Unspecified osteoarthritis, unspecified site: Secondary | ICD-10-CM | POA: Diagnosis not present

## 2019-03-04 DIAGNOSIS — M25552 Pain in left hip: Secondary | ICD-10-CM | POA: Diagnosis not present

## 2019-03-14 DIAGNOSIS — Z01818 Encounter for other preprocedural examination: Secondary | ICD-10-CM | POA: Diagnosis not present

## 2019-03-14 DIAGNOSIS — H25811 Combined forms of age-related cataract, right eye: Secondary | ICD-10-CM | POA: Diagnosis not present

## 2019-03-14 DIAGNOSIS — H2511 Age-related nuclear cataract, right eye: Secondary | ICD-10-CM | POA: Diagnosis not present

## 2019-03-20 DIAGNOSIS — R600 Localized edema: Secondary | ICD-10-CM | POA: Diagnosis not present

## 2019-03-20 DIAGNOSIS — M25552 Pain in left hip: Secondary | ICD-10-CM | POA: Diagnosis not present

## 2019-03-20 DIAGNOSIS — M1990 Unspecified osteoarthritis, unspecified site: Secondary | ICD-10-CM | POA: Diagnosis not present

## 2019-03-20 DIAGNOSIS — M722 Plantar fascial fibromatosis: Secondary | ICD-10-CM | POA: Diagnosis not present

## 2019-04-04 DIAGNOSIS — H25812 Combined forms of age-related cataract, left eye: Secondary | ICD-10-CM | POA: Diagnosis not present

## 2019-04-04 DIAGNOSIS — H2512 Age-related nuclear cataract, left eye: Secondary | ICD-10-CM | POA: Diagnosis not present

## 2019-04-22 DIAGNOSIS — H35341 Macular cyst, hole, or pseudohole, right eye: Secondary | ICD-10-CM | POA: Diagnosis not present

## 2019-04-25 DIAGNOSIS — Z1231 Encounter for screening mammogram for malignant neoplasm of breast: Secondary | ICD-10-CM | POA: Diagnosis not present

## 2019-04-25 DIAGNOSIS — R300 Dysuria: Secondary | ICD-10-CM | POA: Diagnosis not present

## 2019-05-06 DIAGNOSIS — H4311 Vitreous hemorrhage, right eye: Secondary | ICD-10-CM | POA: Diagnosis not present

## 2019-05-06 DIAGNOSIS — H35341 Macular cyst, hole, or pseudohole, right eye: Secondary | ICD-10-CM | POA: Diagnosis not present

## 2019-05-20 DIAGNOSIS — C50919 Malignant neoplasm of unspecified site of unspecified female breast: Secondary | ICD-10-CM | POA: Diagnosis not present

## 2019-05-20 DIAGNOSIS — R600 Localized edema: Secondary | ICD-10-CM | POA: Diagnosis not present

## 2019-05-20 DIAGNOSIS — M1990 Unspecified osteoarthritis, unspecified site: Secondary | ICD-10-CM | POA: Diagnosis not present

## 2019-05-20 DIAGNOSIS — M25552 Pain in left hip: Secondary | ICD-10-CM | POA: Diagnosis not present

## 2019-05-24 DIAGNOSIS — M79672 Pain in left foot: Secondary | ICD-10-CM | POA: Diagnosis not present

## 2019-05-24 DIAGNOSIS — C50919 Malignant neoplasm of unspecified site of unspecified female breast: Secondary | ICD-10-CM | POA: Diagnosis not present

## 2019-05-24 DIAGNOSIS — R600 Localized edema: Secondary | ICD-10-CM | POA: Diagnosis not present

## 2019-05-24 DIAGNOSIS — S92325A Nondisplaced fracture of second metatarsal bone, left foot, initial encounter for closed fracture: Secondary | ICD-10-CM | POA: Diagnosis not present

## 2019-05-24 DIAGNOSIS — M25552 Pain in left hip: Secondary | ICD-10-CM | POA: Diagnosis not present

## 2019-05-27 DIAGNOSIS — K219 Gastro-esophageal reflux disease without esophagitis: Secondary | ICD-10-CM | POA: Diagnosis not present

## 2019-05-27 DIAGNOSIS — S92323A Displaced fracture of second metatarsal bone, unspecified foot, initial encounter for closed fracture: Secondary | ICD-10-CM | POA: Diagnosis not present

## 2019-05-27 DIAGNOSIS — C50919 Malignant neoplasm of unspecified site of unspecified female breast: Secondary | ICD-10-CM | POA: Diagnosis not present

## 2019-05-27 DIAGNOSIS — M25552 Pain in left hip: Secondary | ICD-10-CM | POA: Diagnosis not present

## 2019-06-04 DIAGNOSIS — M5116 Intervertebral disc disorders with radiculopathy, lumbar region: Secondary | ICD-10-CM | POA: Diagnosis not present

## 2019-06-10 DIAGNOSIS — H35341 Macular cyst, hole, or pseudohole, right eye: Secondary | ICD-10-CM | POA: Diagnosis not present

## 2019-06-11 DIAGNOSIS — M25552 Pain in left hip: Secondary | ICD-10-CM | POA: Diagnosis not present

## 2019-06-11 DIAGNOSIS — S92232A Displaced fracture of intermediate cuneiform of left foot, initial encounter for closed fracture: Secondary | ICD-10-CM | POA: Diagnosis not present

## 2019-06-11 DIAGNOSIS — M25511 Pain in right shoulder: Secondary | ICD-10-CM | POA: Diagnosis not present

## 2019-06-11 DIAGNOSIS — C50919 Malignant neoplasm of unspecified site of unspecified female breast: Secondary | ICD-10-CM | POA: Diagnosis not present

## 2019-06-12 ENCOUNTER — Ambulatory Visit: Payer: Managed Care, Other (non HMO) | Admitting: Sports Medicine

## 2019-06-13 DIAGNOSIS — R300 Dysuria: Secondary | ICD-10-CM | POA: Diagnosis not present

## 2019-06-22 DIAGNOSIS — M25511 Pain in right shoulder: Secondary | ICD-10-CM | POA: Diagnosis not present

## 2019-06-22 DIAGNOSIS — R7303 Prediabetes: Secondary | ICD-10-CM | POA: Diagnosis not present

## 2019-06-22 DIAGNOSIS — M25552 Pain in left hip: Secondary | ICD-10-CM | POA: Diagnosis not present

## 2019-06-22 DIAGNOSIS — C50919 Malignant neoplasm of unspecified site of unspecified female breast: Secondary | ICD-10-CM | POA: Diagnosis not present

## 2019-06-22 DIAGNOSIS — E785 Hyperlipidemia, unspecified: Secondary | ICD-10-CM | POA: Diagnosis not present

## 2019-06-22 DIAGNOSIS — I10 Essential (primary) hypertension: Secondary | ICD-10-CM | POA: Diagnosis not present

## 2019-06-22 DIAGNOSIS — K219 Gastro-esophageal reflux disease without esophagitis: Secondary | ICD-10-CM | POA: Diagnosis not present

## 2019-07-03 DIAGNOSIS — I10 Essential (primary) hypertension: Secondary | ICD-10-CM | POA: Diagnosis not present

## 2019-07-03 DIAGNOSIS — E785 Hyperlipidemia, unspecified: Secondary | ICD-10-CM | POA: Diagnosis not present

## 2019-07-03 DIAGNOSIS — C50919 Malignant neoplasm of unspecified site of unspecified female breast: Secondary | ICD-10-CM | POA: Diagnosis not present

## 2019-07-03 DIAGNOSIS — R7303 Prediabetes: Secondary | ICD-10-CM | POA: Diagnosis not present

## 2019-07-31 DIAGNOSIS — I10 Essential (primary) hypertension: Secondary | ICD-10-CM | POA: Diagnosis not present

## 2019-07-31 DIAGNOSIS — M25552 Pain in left hip: Secondary | ICD-10-CM | POA: Diagnosis not present

## 2019-07-31 DIAGNOSIS — E785 Hyperlipidemia, unspecified: Secondary | ICD-10-CM | POA: Diagnosis not present

## 2019-07-31 DIAGNOSIS — R7303 Prediabetes: Secondary | ICD-10-CM | POA: Diagnosis not present

## 2019-10-12 DIAGNOSIS — I10 Essential (primary) hypertension: Secondary | ICD-10-CM | POA: Diagnosis not present

## 2019-10-12 DIAGNOSIS — E785 Hyperlipidemia, unspecified: Secondary | ICD-10-CM | POA: Diagnosis not present

## 2019-10-12 DIAGNOSIS — R7303 Prediabetes: Secondary | ICD-10-CM | POA: Diagnosis not present

## 2019-10-12 DIAGNOSIS — M25511 Pain in right shoulder: Secondary | ICD-10-CM | POA: Diagnosis not present

## 2019-10-19 DIAGNOSIS — E785 Hyperlipidemia, unspecified: Secondary | ICD-10-CM | POA: Diagnosis not present

## 2019-10-19 DIAGNOSIS — M25511 Pain in right shoulder: Secondary | ICD-10-CM | POA: Diagnosis not present

## 2019-10-19 DIAGNOSIS — R7303 Prediabetes: Secondary | ICD-10-CM | POA: Diagnosis not present

## 2019-10-19 DIAGNOSIS — I10 Essential (primary) hypertension: Secondary | ICD-10-CM | POA: Diagnosis not present

## 2019-12-17 DIAGNOSIS — K219 Gastro-esophageal reflux disease without esophagitis: Secondary | ICD-10-CM | POA: Diagnosis not present

## 2019-12-17 DIAGNOSIS — M818 Other osteoporosis without current pathological fracture: Secondary | ICD-10-CM | POA: Diagnosis not present

## 2019-12-17 DIAGNOSIS — Z9181 History of falling: Secondary | ICD-10-CM | POA: Diagnosis not present

## 2019-12-17 DIAGNOSIS — C50919 Malignant neoplasm of unspecified site of unspecified female breast: Secondary | ICD-10-CM | POA: Diagnosis not present

## 2019-12-17 DIAGNOSIS — M25511 Pain in right shoulder: Secondary | ICD-10-CM | POA: Diagnosis not present

## 2019-12-24 DIAGNOSIS — C50919 Malignant neoplasm of unspecified site of unspecified female breast: Secondary | ICD-10-CM | POA: Diagnosis not present

## 2019-12-24 DIAGNOSIS — K219 Gastro-esophageal reflux disease without esophagitis: Secondary | ICD-10-CM | POA: Diagnosis not present

## 2019-12-24 DIAGNOSIS — G609 Hereditary and idiopathic neuropathy, unspecified: Secondary | ICD-10-CM | POA: Diagnosis not present

## 2019-12-24 DIAGNOSIS — M25511 Pain in right shoulder: Secondary | ICD-10-CM | POA: Diagnosis not present

## 2020-01-08 DIAGNOSIS — M25552 Pain in left hip: Secondary | ICD-10-CM | POA: Diagnosis not present

## 2020-01-08 DIAGNOSIS — R7303 Prediabetes: Secondary | ICD-10-CM | POA: Diagnosis not present

## 2020-01-08 DIAGNOSIS — I10 Essential (primary) hypertension: Secondary | ICD-10-CM | POA: Diagnosis not present

## 2020-01-08 DIAGNOSIS — E785 Hyperlipidemia, unspecified: Secondary | ICD-10-CM | POA: Diagnosis not present

## 2020-01-14 DIAGNOSIS — I10 Essential (primary) hypertension: Secondary | ICD-10-CM | POA: Diagnosis not present

## 2020-01-14 DIAGNOSIS — R7303 Prediabetes: Secondary | ICD-10-CM | POA: Diagnosis not present

## 2020-01-14 DIAGNOSIS — E785 Hyperlipidemia, unspecified: Secondary | ICD-10-CM | POA: Diagnosis not present

## 2020-01-14 DIAGNOSIS — C50919 Malignant neoplasm of unspecified site of unspecified female breast: Secondary | ICD-10-CM | POA: Diagnosis not present

## 2020-02-05 DIAGNOSIS — Z20822 Contact with and (suspected) exposure to covid-19: Secondary | ICD-10-CM | POA: Diagnosis not present

## 2020-02-22 DIAGNOSIS — E785 Hyperlipidemia, unspecified: Secondary | ICD-10-CM | POA: Diagnosis not present

## 2020-02-22 DIAGNOSIS — Z139 Encounter for screening, unspecified: Secondary | ICD-10-CM | POA: Diagnosis not present

## 2020-02-22 DIAGNOSIS — R7303 Prediabetes: Secondary | ICD-10-CM | POA: Diagnosis not present

## 2020-02-22 DIAGNOSIS — I10 Essential (primary) hypertension: Secondary | ICD-10-CM | POA: Diagnosis not present

## 2020-02-22 DIAGNOSIS — M659 Synovitis and tenosynovitis, unspecified: Secondary | ICD-10-CM | POA: Diagnosis not present

## 2020-03-05 DIAGNOSIS — T1511XS Foreign body in conjunctival sac, right eye, sequela: Secondary | ICD-10-CM | POA: Diagnosis not present

## 2020-03-21 DIAGNOSIS — K219 Gastro-esophageal reflux disease without esophagitis: Secondary | ICD-10-CM | POA: Diagnosis not present

## 2020-03-21 DIAGNOSIS — M659 Synovitis and tenosynovitis, unspecified: Secondary | ICD-10-CM | POA: Diagnosis not present

## 2020-03-21 DIAGNOSIS — G609 Hereditary and idiopathic neuropathy, unspecified: Secondary | ICD-10-CM | POA: Diagnosis not present

## 2020-03-21 DIAGNOSIS — M818 Other osteoporosis without current pathological fracture: Secondary | ICD-10-CM | POA: Diagnosis not present

## 2020-03-21 DIAGNOSIS — C50919 Malignant neoplasm of unspecified site of unspecified female breast: Secondary | ICD-10-CM | POA: Diagnosis not present

## 2020-04-21 DIAGNOSIS — C50919 Malignant neoplasm of unspecified site of unspecified female breast: Secondary | ICD-10-CM | POA: Diagnosis not present

## 2020-04-21 DIAGNOSIS — M818 Other osteoporosis without current pathological fracture: Secondary | ICD-10-CM | POA: Diagnosis not present

## 2020-04-21 DIAGNOSIS — M659 Synovitis and tenosynovitis, unspecified: Secondary | ICD-10-CM | POA: Diagnosis not present

## 2020-04-21 DIAGNOSIS — K219 Gastro-esophageal reflux disease without esophagitis: Secondary | ICD-10-CM | POA: Diagnosis not present

## 2020-05-07 DIAGNOSIS — L03113 Cellulitis of right upper limb: Secondary | ICD-10-CM | POA: Diagnosis not present

## 2020-05-07 DIAGNOSIS — S61411A Laceration without foreign body of right hand, initial encounter: Secondary | ICD-10-CM | POA: Diagnosis not present

## 2020-05-16 DIAGNOSIS — C50919 Malignant neoplasm of unspecified site of unspecified female breast: Secondary | ICD-10-CM | POA: Diagnosis not present

## 2020-05-16 DIAGNOSIS — R7303 Prediabetes: Secondary | ICD-10-CM | POA: Diagnosis not present

## 2020-05-16 DIAGNOSIS — E785 Hyperlipidemia, unspecified: Secondary | ICD-10-CM | POA: Diagnosis not present

## 2020-05-16 DIAGNOSIS — E559 Vitamin D deficiency, unspecified: Secondary | ICD-10-CM | POA: Diagnosis not present

## 2020-05-16 DIAGNOSIS — I10 Essential (primary) hypertension: Secondary | ICD-10-CM | POA: Diagnosis not present

## 2020-05-26 DIAGNOSIS — M659 Synovitis and tenosynovitis, unspecified: Secondary | ICD-10-CM | POA: Diagnosis not present

## 2020-05-26 DIAGNOSIS — I10 Essential (primary) hypertension: Secondary | ICD-10-CM | POA: Diagnosis not present

## 2020-05-26 DIAGNOSIS — R7303 Prediabetes: Secondary | ICD-10-CM | POA: Diagnosis not present

## 2020-05-26 DIAGNOSIS — F325 Major depressive disorder, single episode, in full remission: Secondary | ICD-10-CM | POA: Diagnosis not present

## 2020-06-02 DIAGNOSIS — M659 Synovitis and tenosynovitis, unspecified: Secondary | ICD-10-CM | POA: Diagnosis not present

## 2020-06-02 DIAGNOSIS — R7303 Prediabetes: Secondary | ICD-10-CM | POA: Diagnosis not present

## 2020-06-02 DIAGNOSIS — F325 Major depressive disorder, single episode, in full remission: Secondary | ICD-10-CM | POA: Diagnosis not present

## 2020-06-02 DIAGNOSIS — I10 Essential (primary) hypertension: Secondary | ICD-10-CM | POA: Diagnosis not present

## 2020-06-03 DIAGNOSIS — L03113 Cellulitis of right upper limb: Secondary | ICD-10-CM | POA: Diagnosis not present

## 2020-06-03 DIAGNOSIS — S61401A Unspecified open wound of right hand, initial encounter: Secondary | ICD-10-CM | POA: Diagnosis not present

## 2020-07-14 DIAGNOSIS — M818 Other osteoporosis without current pathological fracture: Secondary | ICD-10-CM | POA: Diagnosis not present

## 2020-07-14 DIAGNOSIS — M1990 Unspecified osteoarthritis, unspecified site: Secondary | ICD-10-CM | POA: Diagnosis not present

## 2020-07-14 DIAGNOSIS — F325 Major depressive disorder, single episode, in full remission: Secondary | ICD-10-CM | POA: Diagnosis not present

## 2020-07-14 DIAGNOSIS — R7303 Prediabetes: Secondary | ICD-10-CM | POA: Diagnosis not present

## 2020-07-14 DIAGNOSIS — K219 Gastro-esophageal reflux disease without esophagitis: Secondary | ICD-10-CM | POA: Diagnosis not present

## 2020-07-14 DIAGNOSIS — I10 Essential (primary) hypertension: Secondary | ICD-10-CM | POA: Diagnosis not present

## 2020-07-14 DIAGNOSIS — C50919 Malignant neoplasm of unspecified site of unspecified female breast: Secondary | ICD-10-CM | POA: Diagnosis not present

## 2020-07-14 DIAGNOSIS — G609 Hereditary and idiopathic neuropathy, unspecified: Secondary | ICD-10-CM | POA: Diagnosis not present

## 2020-07-14 DIAGNOSIS — E559 Vitamin D deficiency, unspecified: Secondary | ICD-10-CM | POA: Diagnosis not present

## 2020-08-03 DIAGNOSIS — R7303 Prediabetes: Secondary | ICD-10-CM | POA: Diagnosis not present

## 2020-08-03 DIAGNOSIS — K219 Gastro-esophageal reflux disease without esophagitis: Secondary | ICD-10-CM | POA: Diagnosis not present

## 2020-08-03 DIAGNOSIS — I10 Essential (primary) hypertension: Secondary | ICD-10-CM | POA: Diagnosis not present

## 2020-08-03 DIAGNOSIS — M1990 Unspecified osteoarthritis, unspecified site: Secondary | ICD-10-CM | POA: Diagnosis not present

## 2020-08-03 DIAGNOSIS — M659 Synovitis and tenosynovitis, unspecified: Secondary | ICD-10-CM | POA: Diagnosis not present

## 2020-08-03 DIAGNOSIS — F325 Major depressive disorder, single episode, in full remission: Secondary | ICD-10-CM | POA: Diagnosis not present

## 2020-08-03 DIAGNOSIS — C50919 Malignant neoplasm of unspecified site of unspecified female breast: Secondary | ICD-10-CM | POA: Diagnosis not present

## 2020-08-03 DIAGNOSIS — M818 Other osteoporosis without current pathological fracture: Secondary | ICD-10-CM | POA: Diagnosis not present

## 2020-08-03 DIAGNOSIS — E559 Vitamin D deficiency, unspecified: Secondary | ICD-10-CM | POA: Diagnosis not present

## 2020-08-10 DIAGNOSIS — K219 Gastro-esophageal reflux disease without esophagitis: Secondary | ICD-10-CM | POA: Diagnosis not present

## 2020-08-10 DIAGNOSIS — C50919 Malignant neoplasm of unspecified site of unspecified female breast: Secondary | ICD-10-CM | POA: Diagnosis not present

## 2020-08-10 DIAGNOSIS — R7303 Prediabetes: Secondary | ICD-10-CM | POA: Diagnosis not present

## 2020-08-10 DIAGNOSIS — E559 Vitamin D deficiency, unspecified: Secondary | ICD-10-CM | POA: Diagnosis not present

## 2020-08-10 DIAGNOSIS — M659 Synovitis and tenosynovitis, unspecified: Secondary | ICD-10-CM | POA: Diagnosis not present

## 2020-08-10 DIAGNOSIS — F325 Major depressive disorder, single episode, in full remission: Secondary | ICD-10-CM | POA: Diagnosis not present

## 2020-08-10 DIAGNOSIS — L0390 Cellulitis, unspecified: Secondary | ICD-10-CM | POA: Diagnosis not present

## 2020-08-10 DIAGNOSIS — M1990 Unspecified osteoarthritis, unspecified site: Secondary | ICD-10-CM | POA: Diagnosis not present

## 2020-08-10 DIAGNOSIS — I10 Essential (primary) hypertension: Secondary | ICD-10-CM | POA: Diagnosis not present

## 2020-08-29 DIAGNOSIS — E785 Hyperlipidemia, unspecified: Secondary | ICD-10-CM | POA: Diagnosis not present

## 2020-08-29 DIAGNOSIS — M818 Other osteoporosis without current pathological fracture: Secondary | ICD-10-CM | POA: Diagnosis not present

## 2020-08-29 DIAGNOSIS — E559 Vitamin D deficiency, unspecified: Secondary | ICD-10-CM | POA: Diagnosis not present

## 2020-08-29 DIAGNOSIS — F325 Major depressive disorder, single episode, in full remission: Secondary | ICD-10-CM | POA: Diagnosis not present

## 2020-08-29 DIAGNOSIS — R7303 Prediabetes: Secondary | ICD-10-CM | POA: Diagnosis not present

## 2020-08-29 DIAGNOSIS — I10 Essential (primary) hypertension: Secondary | ICD-10-CM | POA: Diagnosis not present

## 2020-08-29 DIAGNOSIS — G609 Hereditary and idiopathic neuropathy, unspecified: Secondary | ICD-10-CM | POA: Diagnosis not present

## 2020-08-29 DIAGNOSIS — C50919 Malignant neoplasm of unspecified site of unspecified female breast: Secondary | ICD-10-CM | POA: Diagnosis not present

## 2020-08-29 DIAGNOSIS — M1990 Unspecified osteoarthritis, unspecified site: Secondary | ICD-10-CM | POA: Diagnosis not present

## 2020-08-29 DIAGNOSIS — K219 Gastro-esophageal reflux disease without esophagitis: Secondary | ICD-10-CM | POA: Diagnosis not present

## 2020-09-22 DIAGNOSIS — Z1231 Encounter for screening mammogram for malignant neoplasm of breast: Secondary | ICD-10-CM | POA: Diagnosis not present

## 2020-09-28 DIAGNOSIS — M1990 Unspecified osteoarthritis, unspecified site: Secondary | ICD-10-CM | POA: Diagnosis not present

## 2020-09-28 DIAGNOSIS — R519 Headache, unspecified: Secondary | ICD-10-CM | POA: Diagnosis not present

## 2020-09-28 DIAGNOSIS — F325 Major depressive disorder, single episode, in full remission: Secondary | ICD-10-CM | POA: Diagnosis not present

## 2020-09-28 DIAGNOSIS — R600 Localized edema: Secondary | ICD-10-CM | POA: Diagnosis not present

## 2020-09-28 DIAGNOSIS — M818 Other osteoporosis without current pathological fracture: Secondary | ICD-10-CM | POA: Diagnosis not present

## 2020-09-28 DIAGNOSIS — K219 Gastro-esophageal reflux disease without esophagitis: Secondary | ICD-10-CM | POA: Diagnosis not present

## 2020-09-28 DIAGNOSIS — G4700 Insomnia, unspecified: Secondary | ICD-10-CM | POA: Diagnosis not present

## 2020-09-28 DIAGNOSIS — C50919 Malignant neoplasm of unspecified site of unspecified female breast: Secondary | ICD-10-CM | POA: Diagnosis not present

## 2020-09-28 DIAGNOSIS — E559 Vitamin D deficiency, unspecified: Secondary | ICD-10-CM | POA: Diagnosis not present

## 2020-10-03 DIAGNOSIS — K121 Other forms of stomatitis: Secondary | ICD-10-CM | POA: Diagnosis not present

## 2020-10-03 DIAGNOSIS — R300 Dysuria: Secondary | ICD-10-CM | POA: Diagnosis not present

## 2020-10-16 DIAGNOSIS — F325 Major depressive disorder, single episode, in full remission: Secondary | ICD-10-CM | POA: Diagnosis not present

## 2020-10-16 DIAGNOSIS — K219 Gastro-esophageal reflux disease without esophagitis: Secondary | ICD-10-CM | POA: Diagnosis not present

## 2020-10-16 DIAGNOSIS — R600 Localized edema: Secondary | ICD-10-CM | POA: Diagnosis not present

## 2020-10-16 DIAGNOSIS — M1990 Unspecified osteoarthritis, unspecified site: Secondary | ICD-10-CM | POA: Diagnosis not present

## 2020-10-16 DIAGNOSIS — M25552 Pain in left hip: Secondary | ICD-10-CM | POA: Diagnosis not present

## 2020-10-16 DIAGNOSIS — M818 Other osteoporosis without current pathological fracture: Secondary | ICD-10-CM | POA: Diagnosis not present

## 2020-10-16 DIAGNOSIS — E559 Vitamin D deficiency, unspecified: Secondary | ICD-10-CM | POA: Diagnosis not present

## 2020-10-16 DIAGNOSIS — R519 Headache, unspecified: Secondary | ICD-10-CM | POA: Diagnosis not present

## 2020-10-16 DIAGNOSIS — C50919 Malignant neoplasm of unspecified site of unspecified female breast: Secondary | ICD-10-CM | POA: Diagnosis not present

## 2020-10-22 DIAGNOSIS — E559 Vitamin D deficiency, unspecified: Secondary | ICD-10-CM | POA: Diagnosis not present

## 2020-10-22 DIAGNOSIS — C50919 Malignant neoplasm of unspecified site of unspecified female breast: Secondary | ICD-10-CM | POA: Diagnosis not present

## 2020-10-22 DIAGNOSIS — M1990 Unspecified osteoarthritis, unspecified site: Secondary | ICD-10-CM | POA: Diagnosis not present

## 2020-10-22 DIAGNOSIS — F325 Major depressive disorder, single episode, in full remission: Secondary | ICD-10-CM | POA: Diagnosis not present

## 2020-10-22 DIAGNOSIS — R519 Headache, unspecified: Secondary | ICD-10-CM | POA: Diagnosis not present

## 2020-10-22 DIAGNOSIS — M818 Other osteoporosis without current pathological fracture: Secondary | ICD-10-CM | POA: Diagnosis not present

## 2020-10-22 DIAGNOSIS — R002 Palpitations: Secondary | ICD-10-CM | POA: Diagnosis not present

## 2020-10-22 DIAGNOSIS — K219 Gastro-esophageal reflux disease without esophagitis: Secondary | ICD-10-CM | POA: Diagnosis not present

## 2020-10-22 DIAGNOSIS — M25552 Pain in left hip: Secondary | ICD-10-CM | POA: Diagnosis not present

## 2020-11-04 DIAGNOSIS — R519 Headache, unspecified: Secondary | ICD-10-CM | POA: Diagnosis not present

## 2020-11-04 DIAGNOSIS — F325 Major depressive disorder, single episode, in full remission: Secondary | ICD-10-CM | POA: Diagnosis not present

## 2020-11-04 DIAGNOSIS — C50919 Malignant neoplasm of unspecified site of unspecified female breast: Secondary | ICD-10-CM | POA: Diagnosis not present

## 2020-11-04 DIAGNOSIS — M818 Other osteoporosis without current pathological fracture: Secondary | ICD-10-CM | POA: Diagnosis not present

## 2020-11-04 DIAGNOSIS — E559 Vitamin D deficiency, unspecified: Secondary | ICD-10-CM | POA: Diagnosis not present

## 2020-11-04 DIAGNOSIS — R002 Palpitations: Secondary | ICD-10-CM | POA: Diagnosis not present

## 2020-11-04 DIAGNOSIS — R600 Localized edema: Secondary | ICD-10-CM | POA: Diagnosis not present

## 2020-11-04 DIAGNOSIS — M1990 Unspecified osteoarthritis, unspecified site: Secondary | ICD-10-CM | POA: Diagnosis not present

## 2020-11-04 DIAGNOSIS — K219 Gastro-esophageal reflux disease without esophagitis: Secondary | ICD-10-CM | POA: Diagnosis not present

## 2020-11-09 DIAGNOSIS — R002 Palpitations: Secondary | ICD-10-CM | POA: Diagnosis not present

## 2020-11-11 DIAGNOSIS — R002 Palpitations: Secondary | ICD-10-CM | POA: Diagnosis not present

## 2020-11-23 DIAGNOSIS — M818 Other osteoporosis without current pathological fracture: Secondary | ICD-10-CM | POA: Diagnosis not present

## 2020-11-23 DIAGNOSIS — R002 Palpitations: Secondary | ICD-10-CM | POA: Diagnosis not present

## 2020-11-23 DIAGNOSIS — F325 Major depressive disorder, single episode, in full remission: Secondary | ICD-10-CM | POA: Diagnosis not present

## 2020-11-23 DIAGNOSIS — R600 Localized edema: Secondary | ICD-10-CM | POA: Diagnosis not present

## 2020-11-23 DIAGNOSIS — M1990 Unspecified osteoarthritis, unspecified site: Secondary | ICD-10-CM | POA: Diagnosis not present

## 2020-11-23 DIAGNOSIS — C50919 Malignant neoplasm of unspecified site of unspecified female breast: Secondary | ICD-10-CM | POA: Diagnosis not present

## 2020-11-23 DIAGNOSIS — E559 Vitamin D deficiency, unspecified: Secondary | ICD-10-CM | POA: Diagnosis not present

## 2020-11-23 DIAGNOSIS — R519 Headache, unspecified: Secondary | ICD-10-CM | POA: Diagnosis not present

## 2020-11-23 DIAGNOSIS — K219 Gastro-esophageal reflux disease without esophagitis: Secondary | ICD-10-CM | POA: Diagnosis not present

## 2020-11-27 DIAGNOSIS — L03113 Cellulitis of right upper limb: Secondary | ICD-10-CM | POA: Diagnosis not present

## 2020-11-27 DIAGNOSIS — S60511A Abrasion of right hand, initial encounter: Secondary | ICD-10-CM | POA: Diagnosis not present

## 2020-11-28 DIAGNOSIS — C50919 Malignant neoplasm of unspecified site of unspecified female breast: Secondary | ICD-10-CM | POA: Diagnosis not present

## 2020-11-28 DIAGNOSIS — F325 Major depressive disorder, single episode, in full remission: Secondary | ICD-10-CM | POA: Diagnosis not present

## 2020-11-28 DIAGNOSIS — E785 Hyperlipidemia, unspecified: Secondary | ICD-10-CM | POA: Diagnosis not present

## 2020-11-28 DIAGNOSIS — E559 Vitamin D deficiency, unspecified: Secondary | ICD-10-CM | POA: Diagnosis not present

## 2020-11-28 DIAGNOSIS — M659 Synovitis and tenosynovitis, unspecified: Secondary | ICD-10-CM | POA: Diagnosis not present

## 2020-11-28 DIAGNOSIS — R7303 Prediabetes: Secondary | ICD-10-CM | POA: Diagnosis not present

## 2020-11-28 DIAGNOSIS — I10 Essential (primary) hypertension: Secondary | ICD-10-CM | POA: Diagnosis not present

## 2020-11-28 DIAGNOSIS — M1990 Unspecified osteoarthritis, unspecified site: Secondary | ICD-10-CM | POA: Diagnosis not present

## 2020-11-28 DIAGNOSIS — G4700 Insomnia, unspecified: Secondary | ICD-10-CM | POA: Diagnosis not present

## 2020-11-28 DIAGNOSIS — R600 Localized edema: Secondary | ICD-10-CM | POA: Diagnosis not present

## 2020-11-28 DIAGNOSIS — R519 Headache, unspecified: Secondary | ICD-10-CM | POA: Diagnosis not present

## 2020-12-21 DIAGNOSIS — R300 Dysuria: Secondary | ICD-10-CM | POA: Diagnosis not present

## 2021-01-01 DIAGNOSIS — S81812A Laceration without foreign body, left lower leg, initial encounter: Secondary | ICD-10-CM | POA: Diagnosis not present

## 2021-01-26 DIAGNOSIS — R519 Headache, unspecified: Secondary | ICD-10-CM | POA: Diagnosis not present

## 2021-01-26 DIAGNOSIS — F325 Major depressive disorder, single episode, in full remission: Secondary | ICD-10-CM | POA: Diagnosis not present

## 2021-01-26 DIAGNOSIS — G4700 Insomnia, unspecified: Secondary | ICD-10-CM | POA: Diagnosis not present

## 2021-01-26 DIAGNOSIS — C50919 Malignant neoplasm of unspecified site of unspecified female breast: Secondary | ICD-10-CM | POA: Diagnosis not present

## 2021-01-26 DIAGNOSIS — E559 Vitamin D deficiency, unspecified: Secondary | ICD-10-CM | POA: Diagnosis not present

## 2021-01-26 DIAGNOSIS — M1990 Unspecified osteoarthritis, unspecified site: Secondary | ICD-10-CM | POA: Diagnosis not present

## 2021-01-26 DIAGNOSIS — J309 Allergic rhinitis, unspecified: Secondary | ICD-10-CM | POA: Diagnosis not present

## 2021-01-26 DIAGNOSIS — K219 Gastro-esophageal reflux disease without esophagitis: Secondary | ICD-10-CM | POA: Diagnosis not present

## 2021-01-26 DIAGNOSIS — R600 Localized edema: Secondary | ICD-10-CM | POA: Diagnosis not present

## 2021-02-20 DIAGNOSIS — I10 Essential (primary) hypertension: Secondary | ICD-10-CM | POA: Diagnosis not present

## 2021-02-20 DIAGNOSIS — C50919 Malignant neoplasm of unspecified site of unspecified female breast: Secondary | ICD-10-CM | POA: Diagnosis not present

## 2021-02-20 DIAGNOSIS — M25511 Pain in right shoulder: Secondary | ICD-10-CM | POA: Diagnosis not present

## 2021-02-20 DIAGNOSIS — K219 Gastro-esophageal reflux disease without esophagitis: Secondary | ICD-10-CM | POA: Diagnosis not present

## 2021-02-20 DIAGNOSIS — Z9181 History of falling: Secondary | ICD-10-CM | POA: Diagnosis not present

## 2021-02-20 DIAGNOSIS — F325 Major depressive disorder, single episode, in full remission: Secondary | ICD-10-CM | POA: Diagnosis not present

## 2021-02-20 DIAGNOSIS — E559 Vitamin D deficiency, unspecified: Secondary | ICD-10-CM | POA: Diagnosis not present

## 2021-02-20 DIAGNOSIS — R600 Localized edema: Secondary | ICD-10-CM | POA: Diagnosis not present

## 2021-02-20 DIAGNOSIS — R7303 Prediabetes: Secondary | ICD-10-CM | POA: Diagnosis not present

## 2021-02-20 DIAGNOSIS — E785 Hyperlipidemia, unspecified: Secondary | ICD-10-CM | POA: Diagnosis not present

## 2021-02-20 DIAGNOSIS — R519 Headache, unspecified: Secondary | ICD-10-CM | POA: Diagnosis not present

## 2021-02-20 DIAGNOSIS — M818 Other osteoporosis without current pathological fracture: Secondary | ICD-10-CM | POA: Diagnosis not present

## 2021-03-25 DIAGNOSIS — B9689 Other specified bacterial agents as the cause of diseases classified elsewhere: Secondary | ICD-10-CM | POA: Diagnosis not present

## 2021-03-25 DIAGNOSIS — J019 Acute sinusitis, unspecified: Secondary | ICD-10-CM | POA: Diagnosis not present

## 2021-03-25 DIAGNOSIS — L309 Dermatitis, unspecified: Secondary | ICD-10-CM | POA: Diagnosis not present

## 2021-04-16 DIAGNOSIS — Z23 Encounter for immunization: Secondary | ICD-10-CM | POA: Diagnosis not present

## 2021-04-16 DIAGNOSIS — M659 Synovitis and tenosynovitis, unspecified: Secondary | ICD-10-CM | POA: Diagnosis not present

## 2021-04-16 DIAGNOSIS — K219 Gastro-esophageal reflux disease without esophagitis: Secondary | ICD-10-CM | POA: Diagnosis not present

## 2021-04-16 DIAGNOSIS — E559 Vitamin D deficiency, unspecified: Secondary | ICD-10-CM | POA: Diagnosis not present

## 2021-04-16 DIAGNOSIS — G4700 Insomnia, unspecified: Secondary | ICD-10-CM | POA: Diagnosis not present

## 2021-04-16 DIAGNOSIS — M818 Other osteoporosis without current pathological fracture: Secondary | ICD-10-CM | POA: Diagnosis not present

## 2021-04-16 DIAGNOSIS — F325 Major depressive disorder, single episode, in full remission: Secondary | ICD-10-CM | POA: Diagnosis not present

## 2021-04-16 DIAGNOSIS — M1990 Unspecified osteoarthritis, unspecified site: Secondary | ICD-10-CM | POA: Diagnosis not present

## 2021-04-16 DIAGNOSIS — C50919 Malignant neoplasm of unspecified site of unspecified female breast: Secondary | ICD-10-CM | POA: Diagnosis not present

## 2021-04-21 DIAGNOSIS — S61412A Laceration without foreign body of left hand, initial encounter: Secondary | ICD-10-CM | POA: Diagnosis not present

## 2021-04-26 DIAGNOSIS — H43823 Vitreomacular adhesion, bilateral: Secondary | ICD-10-CM | POA: Diagnosis not present

## 2021-05-03 DIAGNOSIS — R300 Dysuria: Secondary | ICD-10-CM | POA: Diagnosis not present

## 2021-05-17 DIAGNOSIS — Z139 Encounter for screening, unspecified: Secondary | ICD-10-CM | POA: Diagnosis not present

## 2021-05-17 DIAGNOSIS — E559 Vitamin D deficiency, unspecified: Secondary | ICD-10-CM | POA: Diagnosis not present

## 2021-05-17 DIAGNOSIS — F325 Major depressive disorder, single episode, in full remission: Secondary | ICD-10-CM | POA: Diagnosis not present

## 2021-05-17 DIAGNOSIS — M818 Other osteoporosis without current pathological fracture: Secondary | ICD-10-CM | POA: Diagnosis not present

## 2021-05-17 DIAGNOSIS — K219 Gastro-esophageal reflux disease without esophagitis: Secondary | ICD-10-CM | POA: Diagnosis not present

## 2021-05-17 DIAGNOSIS — G4700 Insomnia, unspecified: Secondary | ICD-10-CM | POA: Diagnosis not present

## 2021-05-17 DIAGNOSIS — C50919 Malignant neoplasm of unspecified site of unspecified female breast: Secondary | ICD-10-CM | POA: Diagnosis not present

## 2021-05-17 DIAGNOSIS — L03115 Cellulitis of right lower limb: Secondary | ICD-10-CM | POA: Diagnosis not present

## 2021-05-17 DIAGNOSIS — M1990 Unspecified osteoarthritis, unspecified site: Secondary | ICD-10-CM | POA: Diagnosis not present

## 2021-05-26 DIAGNOSIS — E559 Vitamin D deficiency, unspecified: Secondary | ICD-10-CM | POA: Diagnosis not present

## 2021-05-26 DIAGNOSIS — L03115 Cellulitis of right lower limb: Secondary | ICD-10-CM | POA: Diagnosis not present

## 2021-05-26 DIAGNOSIS — M1990 Unspecified osteoarthritis, unspecified site: Secondary | ICD-10-CM | POA: Diagnosis not present

## 2021-05-26 DIAGNOSIS — K219 Gastro-esophageal reflux disease without esophagitis: Secondary | ICD-10-CM | POA: Diagnosis not present

## 2021-05-26 DIAGNOSIS — F325 Major depressive disorder, single episode, in full remission: Secondary | ICD-10-CM | POA: Diagnosis not present

## 2021-05-26 DIAGNOSIS — C50919 Malignant neoplasm of unspecified site of unspecified female breast: Secondary | ICD-10-CM | POA: Diagnosis not present

## 2021-05-26 DIAGNOSIS — M818 Other osteoporosis without current pathological fracture: Secondary | ICD-10-CM | POA: Diagnosis not present

## 2021-05-26 DIAGNOSIS — G4700 Insomnia, unspecified: Secondary | ICD-10-CM | POA: Diagnosis not present

## 2021-05-26 DIAGNOSIS — R519 Headache, unspecified: Secondary | ICD-10-CM | POA: Diagnosis not present

## 2021-06-09 DIAGNOSIS — M1990 Unspecified osteoarthritis, unspecified site: Secondary | ICD-10-CM | POA: Diagnosis not present

## 2021-06-09 DIAGNOSIS — Z9181 History of falling: Secondary | ICD-10-CM | POA: Diagnosis not present

## 2021-06-09 DIAGNOSIS — Z1331 Encounter for screening for depression: Secondary | ICD-10-CM | POA: Diagnosis not present

## 2021-06-09 DIAGNOSIS — Z0000 Encounter for general adult medical examination without abnormal findings: Secondary | ICD-10-CM | POA: Diagnosis not present

## 2021-06-09 DIAGNOSIS — R600 Localized edema: Secondary | ICD-10-CM | POA: Diagnosis not present

## 2021-06-09 DIAGNOSIS — M25552 Pain in left hip: Secondary | ICD-10-CM | POA: Diagnosis not present

## 2021-06-09 DIAGNOSIS — I10 Essential (primary) hypertension: Secondary | ICD-10-CM | POA: Diagnosis not present

## 2021-06-09 DIAGNOSIS — Z23 Encounter for immunization: Secondary | ICD-10-CM | POA: Diagnosis not present

## 2021-06-09 DIAGNOSIS — E785 Hyperlipidemia, unspecified: Secondary | ICD-10-CM | POA: Diagnosis not present

## 2021-06-22 DIAGNOSIS — F325 Major depressive disorder, single episode, in full remission: Secondary | ICD-10-CM | POA: Diagnosis not present

## 2021-06-22 DIAGNOSIS — E785 Hyperlipidemia, unspecified: Secondary | ICD-10-CM | POA: Diagnosis not present

## 2021-06-22 DIAGNOSIS — I10 Essential (primary) hypertension: Secondary | ICD-10-CM | POA: Diagnosis not present

## 2021-06-22 DIAGNOSIS — N390 Urinary tract infection, site not specified: Secondary | ICD-10-CM | POA: Diagnosis not present

## 2021-06-22 DIAGNOSIS — R519 Headache, unspecified: Secondary | ICD-10-CM | POA: Diagnosis not present

## 2021-06-22 DIAGNOSIS — I739 Peripheral vascular disease, unspecified: Secondary | ICD-10-CM | POA: Diagnosis not present

## 2021-06-22 DIAGNOSIS — R600 Localized edema: Secondary | ICD-10-CM | POA: Diagnosis not present

## 2021-06-22 DIAGNOSIS — E559 Vitamin D deficiency, unspecified: Secondary | ICD-10-CM | POA: Diagnosis not present

## 2021-06-22 DIAGNOSIS — R7303 Prediabetes: Secondary | ICD-10-CM | POA: Diagnosis not present

## 2021-06-22 DIAGNOSIS — M1990 Unspecified osteoarthritis, unspecified site: Secondary | ICD-10-CM | POA: Diagnosis not present

## 2021-06-22 DIAGNOSIS — C50919 Malignant neoplasm of unspecified site of unspecified female breast: Secondary | ICD-10-CM | POA: Diagnosis not present

## 2021-06-22 DIAGNOSIS — J309 Allergic rhinitis, unspecified: Secondary | ICD-10-CM | POA: Diagnosis not present

## 2021-06-25 ENCOUNTER — Ambulatory Visit (INDEPENDENT_AMBULATORY_CARE_PROVIDER_SITE_OTHER): Payer: Medicare HMO | Admitting: Sports Medicine

## 2021-06-25 ENCOUNTER — Encounter: Payer: Self-pay | Admitting: Sports Medicine

## 2021-06-25 ENCOUNTER — Other Ambulatory Visit: Payer: Self-pay

## 2021-06-25 DIAGNOSIS — M79672 Pain in left foot: Secondary | ICD-10-CM | POA: Diagnosis not present

## 2021-06-25 DIAGNOSIS — M7989 Other specified soft tissue disorders: Secondary | ICD-10-CM | POA: Diagnosis not present

## 2021-06-25 DIAGNOSIS — I739 Peripheral vascular disease, unspecified: Secondary | ICD-10-CM

## 2021-06-25 DIAGNOSIS — L539 Erythematous condition, unspecified: Secondary | ICD-10-CM

## 2021-06-25 DIAGNOSIS — T148XXA Other injury of unspecified body region, initial encounter: Secondary | ICD-10-CM | POA: Diagnosis not present

## 2021-06-25 DIAGNOSIS — M79671 Pain in right foot: Secondary | ICD-10-CM | POA: Diagnosis not present

## 2021-06-25 NOTE — Progress Notes (Addendum)
Subjective: [name] is a 71 y.o. female patient who presents to office for evaluation of both feet states that they are discolored and swell and states that she saw her PCP who gave her antibiotic because of the redness on her legs and states that she also had a small little bleeding area at the second toe on the right.  Patient also reports that she has a painful lesion on the bottom of the right foot.  Patient states that she has been applying antibiotic cream to the right second toe and states that it does seem like it is getting better.  Patient reports that her PCP also ordered her vascular studies of which she is supposed to go for soon.  Patient reports that she took a gabapentin and amitriptyline at the same time and is really wired today.  Denies any other pedal complaints at this time.  Patient Active Problem List   Diagnosis Date Noted   Gastric mass 05/24/2012    Current Outpatient Medications on File Prior to Visit  Medication Sig Dispense Refill   Cholecalciferol (VITAMIN D3 PO) Take by mouth.     Coenzyme Q10 (COQ10 PO) Take by mouth.     MAGNESIUM PO Take by mouth.     pantoprazole (PROTONIX) 40 MG tablet Take by mouth.     amitriptyline (ELAVIL) 25 MG tablet Take 25 mg by mouth 1 day or 1 dose.     amitriptyline (ELAVIL) 50 MG tablet      atorvastatin (LIPITOR) 20 MG tablet      calcium carbonate (OS-CAL) 600 MG TABS Take 600 mg by mouth 2 (two) times daily with a meal. Vitamin D 400 mg     celecoxib (CELEBREX) 200 MG capsule      furosemide (LASIX) 40 MG tablet Take 40 mg by mouth 3 (three) times daily as needed.     gabapentin (NEURONTIN) 400 MG capsule      losartan-hydrochlorothiazide (HYZAAR) 50-12.5 MG per tablet Take 1 tablet by mouth daily.     montelukast (SINGULAIR) 10 MG tablet      Multiple Vitamin (MULTIVITAMIN) tablet Take 1 tablet by mouth daily.     raloxifene (EVISTA) 60 MG tablet Take 60 mg by mouth daily.     sulfamethoxazole-trimethoprim  (BACTRIM DS) 800-160 MG tablet Take 1 tablet by mouth 2 (two) times daily.     TOPROL XL 25 MG 24 hr tablet      Current Facility-Administered Medications on File Prior to Visit  Medication Dose Route Frequency Provider Last Rate Last Admin   triamcinolone acetonide (KENALOG) 10 MG/ML injection 10 mg  10 mg Other Once Landis Martins, DPM        Allergies  Allergen Reactions   Levofloxacin     Possible tendon rupture   Morphine And Related Nausea Only    Objective:  General: Alert and oriented x3 in no acute distress  Dermatology: Nails are well manicured, there is a small abrasion noted to the right second toe and plantar fifth metatarsal head on the right with a small speck of dried blood noted to both areas with no surrounding signs of infection.  There is faint blanchable erythema to both legs likely related to swelling versus cellulitis.  Vascular: Dorsalis Pedis and Posterior Tibial pedal pulses non-palpable, Capillary Fill Time greater than 5 seconds,(-) pedal hair growth bilateral, 1+ pitting edema bilateral lower extremities, Temperature gradient severely decreased bilateral with purple discoloration to toes right greater than left there is significant  varicosities bilateral.  Neurology: Gross sensation intact via light touch bilateral.  Musculoskeletal: Minimal pain to palpation to the right foot.  Pes planus foot type.   Assessment and Plan: Problem List Items Addressed This Visit   None Visit Diagnoses     Abrasion    -  Primary   PVD (peripheral vascular disease) ([place])       Relevant Medications   furosemide (LASIX) 40 MG tablet   Swelling of left lower extremity       Erythema       Foot pain, bilateral            -Complete examination performed -Discussed treatment options and care for abrasion to right foot and for PVD advised patient that due to the significant decrease in temperature and purple discoloration I am more concerned at this time about her  circulation so I recommend that she urgently follow up with the vascular doctor as arranged by PCP -Advised patient to continue with mupirocin antibiotic ointment that she has at home at the abrasion at the right second toe and right plantar foot at the fifth metatarsal head covered with Band-Aids daily -Advised patient to continue with antibiotics as given by PCP -Encouraged elevation to assist with edema control and gentle support from compression garments -Dispensed a new set of power step insoles to patient to use as directed -Patient to return to office as needed or sooner if condition worsens.  Landis Martins, DPM

## 2021-06-28 DIAGNOSIS — R519 Headache, unspecified: Secondary | ICD-10-CM | POA: Diagnosis not present

## 2021-06-28 DIAGNOSIS — M1990 Unspecified osteoarthritis, unspecified site: Secondary | ICD-10-CM | POA: Diagnosis not present

## 2021-06-28 DIAGNOSIS — E559 Vitamin D deficiency, unspecified: Secondary | ICD-10-CM | POA: Diagnosis not present

## 2021-06-28 DIAGNOSIS — F325 Major depressive disorder, single episode, in full remission: Secondary | ICD-10-CM | POA: Diagnosis not present

## 2021-06-28 DIAGNOSIS — G4700 Insomnia, unspecified: Secondary | ICD-10-CM | POA: Diagnosis not present

## 2021-06-28 DIAGNOSIS — I739 Peripheral vascular disease, unspecified: Secondary | ICD-10-CM | POA: Diagnosis not present

## 2021-06-28 DIAGNOSIS — R600 Localized edema: Secondary | ICD-10-CM | POA: Diagnosis not present

## 2021-06-28 DIAGNOSIS — C50919 Malignant neoplasm of unspecified site of unspecified female breast: Secondary | ICD-10-CM | POA: Diagnosis not present

## 2021-06-28 DIAGNOSIS — I10 Essential (primary) hypertension: Secondary | ICD-10-CM | POA: Diagnosis not present

## 2021-06-28 DIAGNOSIS — J309 Allergic rhinitis, unspecified: Secondary | ICD-10-CM | POA: Diagnosis not present

## 2021-07-06 DIAGNOSIS — C50919 Malignant neoplasm of unspecified site of unspecified female breast: Secondary | ICD-10-CM | POA: Diagnosis not present

## 2021-07-06 DIAGNOSIS — N1832 Chronic kidney disease, stage 3b: Secondary | ICD-10-CM | POA: Diagnosis not present

## 2021-07-06 DIAGNOSIS — R519 Headache, unspecified: Secondary | ICD-10-CM | POA: Diagnosis not present

## 2021-07-06 DIAGNOSIS — M1990 Unspecified osteoarthritis, unspecified site: Secondary | ICD-10-CM | POA: Diagnosis not present

## 2021-07-06 DIAGNOSIS — E559 Vitamin D deficiency, unspecified: Secondary | ICD-10-CM | POA: Diagnosis not present

## 2021-07-06 DIAGNOSIS — I739 Peripheral vascular disease, unspecified: Secondary | ICD-10-CM | POA: Diagnosis not present

## 2021-07-06 DIAGNOSIS — J309 Allergic rhinitis, unspecified: Secondary | ICD-10-CM | POA: Diagnosis not present

## 2021-07-06 DIAGNOSIS — F325 Major depressive disorder, single episode, in full remission: Secondary | ICD-10-CM | POA: Diagnosis not present

## 2021-07-06 DIAGNOSIS — R600 Localized edema: Secondary | ICD-10-CM | POA: Diagnosis not present

## 2021-07-07 DIAGNOSIS — R519 Headache, unspecified: Secondary | ICD-10-CM | POA: Diagnosis not present

## 2021-07-07 DIAGNOSIS — S134XXA Sprain of ligaments of cervical spine, initial encounter: Secondary | ICD-10-CM | POA: Diagnosis not present

## 2021-07-07 DIAGNOSIS — M25552 Pain in left hip: Secondary | ICD-10-CM | POA: Diagnosis not present

## 2021-07-07 DIAGNOSIS — S161XXA Strain of muscle, fascia and tendon at neck level, initial encounter: Secondary | ICD-10-CM | POA: Diagnosis not present

## 2021-07-07 DIAGNOSIS — M79672 Pain in left foot: Secondary | ICD-10-CM | POA: Diagnosis not present

## 2021-07-07 DIAGNOSIS — S93422A Sprain of deltoid ligament of left ankle, initial encounter: Secondary | ICD-10-CM | POA: Diagnosis not present

## 2021-07-07 DIAGNOSIS — M25572 Pain in left ankle and joints of left foot: Secondary | ICD-10-CM | POA: Diagnosis not present

## 2021-07-07 DIAGNOSIS — M542 Cervicalgia: Secondary | ICD-10-CM | POA: Diagnosis not present

## 2021-07-07 DIAGNOSIS — S7002XA Contusion of left hip, initial encounter: Secondary | ICD-10-CM | POA: Diagnosis not present

## 2021-07-07 DIAGNOSIS — M25512 Pain in left shoulder: Secondary | ICD-10-CM | POA: Diagnosis not present

## 2021-07-07 DIAGNOSIS — S0083XA Contusion of other part of head, initial encounter: Secondary | ICD-10-CM | POA: Diagnosis not present

## 2021-07-07 DIAGNOSIS — S93402A Sprain of unspecified ligament of left ankle, initial encounter: Secondary | ICD-10-CM | POA: Diagnosis not present

## 2021-07-07 DIAGNOSIS — S40012A Contusion of left shoulder, initial encounter: Secondary | ICD-10-CM | POA: Diagnosis not present

## 2021-07-11 DIAGNOSIS — R0781 Pleurodynia: Secondary | ICD-10-CM | POA: Diagnosis not present

## 2021-07-11 DIAGNOSIS — K449 Diaphragmatic hernia without obstruction or gangrene: Secondary | ICD-10-CM | POA: Diagnosis not present

## 2021-07-18 ENCOUNTER — Other Ambulatory Visit: Payer: Self-pay

## 2021-07-18 DIAGNOSIS — I739 Peripheral vascular disease, unspecified: Secondary | ICD-10-CM

## 2021-07-22 NOTE — Progress Notes (Addendum)
Office Note     CC: Right second and third toe discoloration Requesting Provider:  [name], MD  HPI: Dari Carpenito is a 71 y.o. ([birth year]) female presenting at the request of .[name], MD for right second and third toe discoloration.  Tender to have discoloration in bilateral feet for a number of years, that waxes and wanes.  Recently however, she appreciated mottling in the second and third toes on the right foot.  This occurred in December, and the areas have not improved.  She has been on antibiotics without improvement and has seen Dr. Cannon Kettle, D.P.M. who recommended vascular surgery follow-up.  There are wounds at the nailbeds on each of these toes, with a small amount of drainage.  She has tried creams, but nothing has led to healing.  She denies symptoms of claudication, ischemic rest pain.  Her left leg is asymptomatic.  The pt is  on a statin for cholesterol management.  The pt is not on a daily aspirin.   Other AC:  - The pt is  on medication for hypertension.   The pt is  diabetic.  Tobacco hx:  none  Past Medical History:  Diagnosis Date   Arthritis    joints   Asthma    allergic   GERD (gastroesophageal reflux disease)    H/O hiatal hernia    Heart murmur    not MVP no treatment   Hyperlipidemia    Hypertension    Insomnia    Osteopenia    Osteoporosis    PONV (postoperative nausea and vomiting)    Rosacea    neck,cheeks    Past Surgical History:  Procedure Laterality Date   ABDOMINAL HYSTERECTOMY     Bladder Tack     Blader Tack     BREAST LUMPECTOMY  04-1999   BREAST LUMPECTOMY  05-1999   COLON SURGERY  07-2010   COLONOSCOPY W/ POLYPECTOMY     EDG  with polypectomy (gastric polyps)     EUS  05/24/2012   Procedure: UPPER ENDOSCOPIC ULTRASOUND (EUS) LINEAR;  Surgeon: Milus Banister, MD;  Location: [facility];  Service: Endoscopy;  Laterality: N/A;   Lumpectomy 07-2000  2002   Port a cath Insertion  2001   PORT-A-CATH REMOVAL  05-2000    TONSILLECTOMY      Social History   Socioeconomic History   Marital status: Widowed    Spouse name: Not on file   Number of children: Not on file   Years of education: Not on file   Highest education level: Not on file  Occupational History   Not on file  Tobacco Use   Smoking status: Never   Smokeless tobacco: Never  Substance and Sexual Activity   Alcohol use: No   Drug use: No   Sexual activity: Not on file  Other Topics Concern   Not on file  Social History Narrative   Not on file   Social Determinants of Health   Financial Resource Strain: Not on file  Food Insecurity: Not on file  Transportation Needs: Not on file  Physical Activity: Not on file  Stress: Not on file  Social Connections: Not on file  Intimate Partner Violence: Not on file   No family history on file.  Current Outpatient Medications  Medication Sig Dispense Refill   amitriptyline (ELAVIL) 25 MG tablet Take 25 mg by mouth 1 day or 1 dose.     amitriptyline (ELAVIL) 50 MG tablet  atorvastatin (LIPITOR) 20 MG tablet      calcium carbonate (OS-CAL) 600 MG TABS Take 600 mg by mouth 2 (two) times daily with a meal. Vitamin D 400 mg     celecoxib (CELEBREX) 200 MG capsule      Cholecalciferol (VITAMIN D3 PO) Take by mouth.     Coenzyme Q10 (COQ10 PO) Take by mouth.     furosemide (LASIX) 40 MG tablet Take 40 mg by mouth 3 (three) times daily as needed.     gabapentin (NEURONTIN) 400 MG capsule      losartan-hydrochlorothiazide (HYZAAR) 50-12.5 MG per tablet Take 1 tablet by mouth daily.     MAGNESIUM PO Take by mouth.     montelukast (SINGULAIR) 10 MG tablet      Multiple Vitamin (MULTIVITAMIN) tablet Take 1 tablet by mouth daily.     pantoprazole (PROTONIX) 40 MG tablet Take by mouth.     raloxifene (EVISTA) 60 MG tablet Take 60 mg by mouth daily.     sulfamethoxazole-trimethoprim (BACTRIM DS) 800-160 MG tablet Take 1 tablet by mouth 2 (two) times daily.     TOPROL XL 25 MG 24 hr tablet       Current Facility-Administered Medications  Medication Dose Route Frequency Provider Last Rate Last Admin   triamcinolone acetonide (KENALOG) 10 MG/ML injection 10 mg  10 mg Other Once Landis Martins, DPM        Allergies  Allergen Reactions   Levofloxacin     Possible tendon rupture   Morphine And Related Nausea Only     REVIEW OF SYSTEMS:   [X]  denotes positive finding, [ ]  denotes negative finding Cardiac  Comments:  Chest pain or chest pressure:    Shortness of breath upon exertion:    Short of breath when lying flat:    Irregular heart rhythm:        Vascular    Pain in calf, thigh, or hip brought on by ambulation:    Pain in feet at night that wakes you up from your sleep:     Blood clot in your veins:    Leg swelling:         Pulmonary    Oxygen at home:    Productive cough:     Wheezing:         Neurologic    Sudden weakness in arms or legs:     Sudden numbness in arms or legs:     Sudden onset of difficulty speaking or slurred speech:    Temporary loss of vision in one eye:     Problems with dizziness:         Gastrointestinal    Blood in stool:     Vomited blood:         Genitourinary    Burning when urinating:     Blood in urine:        Psychiatric    Major depression:         Hematologic    Bleeding problems:    Problems with blood clotting too easily:        Skin    Rashes or ulcers:        Constitutional    Fever or chills:      PHYSICAL EXAMINATION:  There were no vitals filed for this visit.  General:  WDWN in NAD; vital signs documented above Gait: Not observed HENT: WNL, normocephalic Pulmonary: normal non-labored breathing , without wheezing Cardiac: regular HR,  Abdomen: soft, NT,  no masses Skin: without rashes Vascular Exam/Pulses:  Right Left  Radial 2+ (normal) 2+ (normal)  Ulnar 2+ (normal) 2+ (normal)  Femoral 2+ (normal) 2+ (normal)  Popliteal    DP 2+ (normal) 2+ (normal)  PT     Extremities: with  ischemic changes, without Gangrene , without cellulitis; with open wounds at the right second and third toe nailbeds        Musculoskeletal: no muscle wasting or atrophy Lymphedema appreciated in bilateral lower extremities  Neurologic: A&O X 3;  No focal weakness or paresthesias are detected Psychiatric:  The pt has Normal affect.   Non-Invasive Vascular Imaging:   Summary:  Right: Resting right ankle-brachial index indicates noncompressible right lower extremity arteries. Biphasic waveforms suggest adequate perfusion.  The right toe-brachial index is abnormal. Decreased PPG waveform right 2nd and 3rd toes.  Great toe pressure 54  Left: Resting left ankle-brachial index is within normal range. No  evidence of significant left lower extremity arterial disease. The left  toe-brachial index is abnormal.  Great toe pressure 52    ASSESSMENT/PLAN: Renee Erb is a 71 y.o. female presenting with a 62-month history of nonhealing wounds on the right second and third toes. She denies symptoms of claudication, ischemic rest pain.  ABI was reviewed demonstrating biphasic waveforms to the ankle with microvascular disease.  Interestingly, PPG waveforms are depressed in the second and third toes but are closer to normal in the great toe.  The abnormal distribution and toe pressure is concerning for blue toe syndrome rather than simple, diffuse microvascular disease.  Further supporting possible atheroembolic event is the medial calcinosis appreciated in the arteries - falsely elevated ABIs, but preserved waveforms to the ankle.  Lyndsey has a history of breast cancer and was on several chemotherapeutic agents in the early 2000's which could be a driver.  This may also be the driver of her bilateral lower extremity lymphedema.  I am unsure if there is a venous component to this, however we can address this in future visits.  I had a long discussion with Maven regarding the above.  I think she  would be best served with echo, CT angio chest abdomen pelvis with runoff in an effort to assess her vascular tree for possible nidus lesions.  Should not be found, I would still recommend right lower extremity angiography in an effort to define and improve distal perfusion as the nonhealing wounds will likely result in an amputation otherwise.  I will see Marili in follow-up after her studies have been performed.  Pending the results, we will discuss possible right lower extremity angiography.  I asked that she continue her statin medication and initiate 81mg  aspirin therapy daily.  Broadus John, MD [facility] [phone]

## 2021-07-23 ENCOUNTER — Other Ambulatory Visit: Payer: Self-pay

## 2021-07-23 ENCOUNTER — Ambulatory Visit: Payer: Medicare HMO | Admitting: Vascular Surgery

## 2021-07-23 ENCOUNTER — Encounter: Payer: Self-pay | Admitting: Vascular Surgery

## 2021-07-23 ENCOUNTER — Ambulatory Visit (HOSPITAL_COMMUNITY)
Admission: RE | Admit: 2021-07-23 | Discharge: 2021-07-23 | Disposition: A | Discharge status: Home / Self Care | Payer: Medicare HMO | Source: Ambulatory Visit | Attending: Vascular Surgery | Admitting: Vascular Surgery

## 2021-07-23 VITALS — BP 108/65 | HR 90 | Temp 97.7°F | Resp 20 | Ht 63.0 in | Wt 140.0 lb

## 2021-07-23 DIAGNOSIS — I739 Peripheral vascular disease, unspecified: Secondary | ICD-10-CM

## 2021-07-23 DIAGNOSIS — I75021 Atheroembolism of right lower extremity: Secondary | ICD-10-CM

## 2021-07-26 ENCOUNTER — Other Ambulatory Visit: Payer: Self-pay

## 2021-07-26 DIAGNOSIS — G609 Hereditary and idiopathic neuropathy, unspecified: Secondary | ICD-10-CM | POA: Diagnosis not present

## 2021-07-26 DIAGNOSIS — K219 Gastro-esophageal reflux disease without esophagitis: Secondary | ICD-10-CM | POA: Diagnosis not present

## 2021-07-26 DIAGNOSIS — I10 Essential (primary) hypertension: Secondary | ICD-10-CM | POA: Diagnosis not present

## 2021-07-26 DIAGNOSIS — M818 Other osteoporosis without current pathological fracture: Secondary | ICD-10-CM | POA: Diagnosis not present

## 2021-07-26 DIAGNOSIS — C50919 Malignant neoplasm of unspecified site of unspecified female breast: Secondary | ICD-10-CM | POA: Diagnosis not present

## 2021-07-26 DIAGNOSIS — G4700 Insomnia, unspecified: Secondary | ICD-10-CM | POA: Diagnosis not present

## 2021-07-26 DIAGNOSIS — I739 Peripheral vascular disease, unspecified: Secondary | ICD-10-CM

## 2021-07-26 DIAGNOSIS — E559 Vitamin D deficiency, unspecified: Secondary | ICD-10-CM | POA: Diagnosis not present

## 2021-07-26 DIAGNOSIS — I75021 Atheroembolism of right lower extremity: Secondary | ICD-10-CM

## 2021-07-26 DIAGNOSIS — F325 Major depressive disorder, single episode, in full remission: Secondary | ICD-10-CM | POA: Diagnosis not present

## 2021-07-26 DIAGNOSIS — M659 Synovitis and tenosynovitis, unspecified: Secondary | ICD-10-CM | POA: Diagnosis not present

## 2021-07-27 ENCOUNTER — Ambulatory Visit (HOSPITAL_COMMUNITY)
Admission: RE | Admit: 2021-07-27 | Discharge: 2021-07-27 | Disposition: A | Discharge status: Home / Self Care | Payer: Medicare HMO | Source: Ambulatory Visit | Attending: Vascular Surgery | Admitting: Vascular Surgery

## 2021-07-27 ENCOUNTER — Other Ambulatory Visit: Payer: Self-pay

## 2021-07-27 DIAGNOSIS — I358 Other nonrheumatic aortic valve disorders: Secondary | ICD-10-CM | POA: Diagnosis not present

## 2021-07-27 DIAGNOSIS — I739 Peripheral vascular disease, unspecified: Secondary | ICD-10-CM | POA: Diagnosis not present

## 2021-07-27 DIAGNOSIS — I75021 Atheroembolism of right lower extremity: Secondary | ICD-10-CM | POA: Insufficient documentation

## 2021-07-27 DIAGNOSIS — I350 Nonrheumatic aortic (valve) stenosis: Secondary | ICD-10-CM | POA: Diagnosis not present

## 2021-07-27 DIAGNOSIS — I3481 Nonrheumatic mitral (valve) annulus calcification: Secondary | ICD-10-CM | POA: Diagnosis not present

## 2021-07-27 DIAGNOSIS — I7389 Other specified peripheral vascular diseases: Secondary | ICD-10-CM

## 2021-07-27 DIAGNOSIS — I342 Nonrheumatic mitral (valve) stenosis: Secondary | ICD-10-CM | POA: Diagnosis not present

## 2021-07-27 LAB — ECHOCARDIOGRAM COMPLETE
AR max vel: 2.2 cm2
AV Area VTI: 2.14 cm2
AV Area mean vel: 1.96 cm2
AV Mean grad: 8 mmHg
AV Peak grad: 12.8 mmHg
Ao pk vel: 1.79 m/s
Area-P 1/2: 3.3 cm2
S' Lateral: 1.9 cm

## 2021-07-27 NOTE — Progress Notes (Signed)
Echocardiogram 2D Echocardiogram has been performed.  [name] 07/27/2021, 12:11 PM

## 2021-08-04 ENCOUNTER — Ambulatory Visit
Admission: RE | Admit: 2021-08-04 | Discharge: 2021-08-04 | Disposition: A | Discharge status: Home / Self Care | Payer: Medicare HMO | Source: Ambulatory Visit | Attending: Vascular Surgery | Admitting: Vascular Surgery

## 2021-08-04 DIAGNOSIS — I739 Peripheral vascular disease, unspecified: Secondary | ICD-10-CM

## 2021-08-04 DIAGNOSIS — K8020 Calculus of gallbladder without cholecystitis without obstruction: Secondary | ICD-10-CM | POA: Diagnosis not present

## 2021-08-04 DIAGNOSIS — S2243XA Multiple fractures of ribs, bilateral, initial encounter for closed fracture: Secondary | ICD-10-CM | POA: Diagnosis not present

## 2021-08-04 DIAGNOSIS — I2510 Atherosclerotic heart disease of native coronary artery without angina pectoris: Secondary | ICD-10-CM | POA: Diagnosis not present

## 2021-08-04 DIAGNOSIS — M7989 Other specified soft tissue disorders: Secondary | ICD-10-CM | POA: Diagnosis not present

## 2021-08-04 DIAGNOSIS — K449 Diaphragmatic hernia without obstruction or gangrene: Secondary | ICD-10-CM | POA: Diagnosis not present

## 2021-08-04 MED ORDER — IOPAMIDOL (ISOVUE-370) INJECTION 76%
100.0000 mL | Freq: Once | INTRAVENOUS | Status: AC | PRN
  Administered 2021-08-04: 100 mL via INTRAVENOUS

## 2021-08-06 ENCOUNTER — Ambulatory Visit: Payer: Medicare HMO | Admitting: Vascular Surgery

## 2021-08-10 DIAGNOSIS — N1832 Chronic kidney disease, stage 3b: Secondary | ICD-10-CM | POA: Diagnosis not present

## 2021-08-10 DIAGNOSIS — I739 Peripheral vascular disease, unspecified: Secondary | ICD-10-CM | POA: Diagnosis not present

## 2021-08-10 DIAGNOSIS — R519 Headache, unspecified: Secondary | ICD-10-CM | POA: Diagnosis not present

## 2021-08-10 DIAGNOSIS — J309 Allergic rhinitis, unspecified: Secondary | ICD-10-CM | POA: Diagnosis not present

## 2021-08-10 DIAGNOSIS — C50919 Malignant neoplasm of unspecified site of unspecified female breast: Secondary | ICD-10-CM | POA: Diagnosis not present

## 2021-08-10 DIAGNOSIS — R600 Localized edema: Secondary | ICD-10-CM | POA: Diagnosis not present

## 2021-08-10 DIAGNOSIS — M1990 Unspecified osteoarthritis, unspecified site: Secondary | ICD-10-CM | POA: Diagnosis not present

## 2021-08-10 DIAGNOSIS — E559 Vitamin D deficiency, unspecified: Secondary | ICD-10-CM | POA: Diagnosis not present

## 2021-08-10 DIAGNOSIS — M659 Synovitis and tenosynovitis, unspecified: Secondary | ICD-10-CM | POA: Diagnosis not present

## 2021-08-19 NOTE — Progress Notes (Signed)
?Office Note  ? ?Needs Angio ? ?CC: Right second and third toe discoloration ?Requesting Provider:  [name], MD ? ?HPI: [name] is a 71 y.o. (05-Dec-1950) female presenting in follow up for right second and third toe discoloration.  ? ?At her last visit, she was diagnosed with blue toe syndrome.  CT angio chest abdomen pelvis with runoff was ordered in an effort to elucidate a possible nidus.  This and echo were negative.  She presents today to discuss the Mccarthy findings and for wound check. ? ?On exam, [name] was doing well, accompanied by her daughter.  She noted improvement in her right second toe, but worsening in the right third toe, with dry gangrene at the tip.  He has had waxing and waning pain that involves a small portion of her forefoot.  She denies further drainage.She denies symptoms of claudication, ischemic rest pain.  Her left leg is asymptomatic. ? ? ?The pt is  on a statin for cholesterol management.  ?The pt is not on a daily aspirin.   Other AC:  - ?The pt is  on medication for hypertension.   ?The pt is  diabetic.  ?Tobacco hx:  none ? ?Past Medical History:  ?Diagnosis Date  ? Arthritis   ? joints  ? Asthma   ? allergic  ? GERD (gastroesophageal reflux disease)   ? H/O hiatal hernia   ? Heart murmur   ? not MVP no treatment  ? Hyperlipidemia   ? Hypertension   ? Insomnia   ? Osteopenia   ? Osteoporosis   ? PONV (postoperative nausea and vomiting)   ? Rosacea   ? neck,cheeks  ? ? ?Past Surgical History:  ?Procedure Laterality Date  ? ABDOMINAL HYSTERECTOMY    ? Bladder Tack    ? Blader Tack    ? BREAST LUMPECTOMY  04-1999  ? BREAST LUMPECTOMY  05-1999  ? COLON SURGERY  07-2010  ? COLONOSCOPY W/ POLYPECTOMY    ? EDG  with polypectomy (gastric polyps)    ? EUS  05/24/2012  ? Procedure: UPPER ENDOSCOPIC ULTRASOUND (EUS) LINEAR;  Surgeon: Milus Banister, MD;  Location: [facility];  Service: Endoscopy;  Laterality: N/A;  ? Lumpectomy 07-2000  2002  ? Port a cath Insertion  2001  ?  PORT-A-CATH REMOVAL  05-2000  ? TONSILLECTOMY    ? ? ?Social History  ? ?Socioeconomic History  ? Marital status: Widowed  ?  Spouse name: Not on file  ? Number of children: Not on file  ? Years of education: Not on file  ? Highest education level: Not on file  ?Occupational History  ? Not on file  ?Tobacco Use  ? Smoking status: Never  ? Smokeless tobacco: Never  ?Substance and Sexual Activity  ? Alcohol use: No  ? Drug use: No  ? Sexual activity: Not on file  ?Other Topics Concern  ? Not on file  ?Social History Narrative  ? Not on file  ? ?Social Determinants of Health  ? ?Financial Resource Strain: Not on file  ?Food Insecurity: Not on file  ?Transportation Needs: Not on file  ?Physical Activity: Not on file  ?Stress: Not on file  ?Social Connections: Not on file  ?Intimate Partner Violence: Not on file  ? ?No family history on file. ? ?Current Outpatient Medications  ?Medication Sig Dispense Refill  ? amitriptyline (ELAVIL) 75 MG tablet Take 75 mg by mouth at bedtime.    ? atorvastatin (LIPITOR) 20 MG tablet     ?  calcium carbonate (OS-CAL) 600 MG TABS Take 600 mg by mouth 2 (two) times daily with a meal. Vitamin D 400 mg    ? celecoxib (CELEBREX) 200 MG capsule     ? Cholecalciferol (VITAMIN D3 PO) Take by mouth.    ? Coenzyme Q10 (COQ10 PO) Take by mouth.    ? furosemide (LASIX) 40 MG tablet Take 40 mg by mouth 3 (three) times daily as needed.    ? gabapentin (NEURONTIN) 400 MG capsule     ? losartan-hydrochlorothiazide (HYZAAR) 100-25 MG tablet Take 1 tablet by mouth daily.    ? MAGNESIUM PO Take by mouth.    ? montelukast (SINGULAIR) 10 MG tablet     ? Multiple Vitamin (MULTIVITAMIN) tablet Take 1 tablet by mouth daily.    ? pantoprazole (PROTONIX) 40 MG tablet Take by mouth.    ? raloxifene (EVISTA) 60 MG tablet Take 60 mg by mouth daily.    ? sulfamethoxazole-trimethoprim (BACTRIM DS) 800-160 MG tablet Take 1 tablet by mouth 2 (two) times daily.    ? TOPROL XL 25 MG 24 hr tablet  (Patient not taking:  Reported on 07/23/2021)    ? ?Current Facility-Administered Medications  ?Medication Dose Route Frequency Provider Last Rate Last Admin  ? triamcinolone acetonide (KENALOG) 10 MG/ML injection 10 mg  10 mg Other Once Landis Martins, DPM      ? ? ?Allergies  ?Allergen Reactions  ? Levofloxacin   ?  Possible tendon rupture  ? Morphine And Related Nausea Only  ? ? ? ?REVIEW OF SYSTEMS:  ? ?'[X]'$  denotes positive finding, '[ ]'$  denotes negative finding ?Cardiac  Comments:  ?Chest pain or chest pressure:    ?Shortness of breath upon exertion:    ?Short of breath when lying flat:    ?Irregular heart rhythm:    ?    ?Vascular    ?Pain in calf, thigh, or hip brought on by ambulation:    ?Pain in feet at night that wakes you up from your sleep:     ?Blood clot in your veins:    ?Leg swelling:     ?    ?Pulmonary    ?Oxygen at home:    ?Productive cough:     ?Wheezing:     ?    ?Neurologic    ?Sudden weakness in arms or legs:     ?Sudden numbness in arms or legs:     ?Sudden onset of difficulty speaking or slurred speech:    ?Temporary loss of vision in one eye:     ?Problems with dizziness:     ?    ?Gastrointestinal    ?Blood in stool:     ?Vomited blood:     ?    ?Genitourinary    ?Burning when urinating:     ?Blood in urine:    ?    ?Psychiatric    ?Major depression:     ?    ?Hematologic    ?Bleeding problems:    ?Problems with blood clotting too easily:    ?    ?Skin    ?Rashes or ulcers:    ?    ?Constitutional    ?Fever or chills:    ? ? ?PHYSICAL EXAMINATION: ? ?There were no vitals filed for this visit. ? ?General:  WDWN in NAD; vital signs documented Mccarthy ?Gait: Not observed ?HENT: WNL, normocephalic ?Pulmonary: normal non-labored breathing , without wheezing ?Cardiac: regular HR,  ?Abdomen: soft, NT, no masses ?Skin: without rashes ?  Vascular Exam/Pulses: ? Right Left  ?Radial 2+ (normal) 2+ (normal)  ?Ulnar 2+ (normal) 2+ (normal)  ?Femoral 2+ (normal) 2+ (normal)  ?Popliteal    ?DP 2+ (normal) 2+ (normal)  ?PT     ? ?Extremities: with ischemic changes, without Gangrene , without cellulitis; with open wounds at the right second and third toe nailbeds ? ? ? ? ? ? ? ? ? ? ?Musculoskeletal: no muscle wasting or atrophy ?Lymphedema appreciated in bilateral lower extremities  ?Neurologic: A&O X 3;  No focal weakness or paresthesias are detected ?Psychiatric:  The pt has Normal affect. ? ? ?Non-Invasive Vascular Imaging:   ?Summary:  ?Right: Resting right ankle-brachial index indicates noncompressible right lower extremity arteries. Biphasic waveforms suggest adequate perfusion.  ?The right toe-brachial index is abnormal. Decreased PPG waveform right 2nd and 3rd toes.  ?Great toe pressure 54 ? ?Left: Resting left ankle-brachial index is within normal range. No  ?evidence of significant left lower extremity arterial disease. The left  ?toe-brachial index is abnormal.  ?Great toe pressure 52 ? ? ? ?ASSESSMENT/PLAN: [name] is a 71 y.o. female presenting with a 61-monthhistory of nonhealing wounds on the right second and third toes.  Distribution is consistent with blue toe syndrome.  She denies symptoms of claudication, but does have pain at the toes.  CT angiogram demonstrated no discernible lesions that could be a nidus for disease, however there is mild, diffuse atherosclerotic disease.  Echo was also negative. [name] a history of breast cancer and was on several chemotherapeutic agents in the early 2000's which could be a driver.   ? ?I had a long discussion with [name].  Being that her toe pressures are below the necessary level needed for wound healing, my concern is, that should she need a toe amputation for necrosis, the site may not heal.  After discussing the risk and benefits of right lower extremity angiography in an effort to define and improve distal perfusion, STammaraelected to proceed. ? ?I asked that she continue her statin medication and '81mg'$  aspirin therapy daily. ? ?I will also  ensure Dr. [name] attached this message for close follow-up. ? ?JBroadus John MD ?[facility] ?[phone]? ?

## 2021-08-20 ENCOUNTER — Encounter: Payer: Self-pay | Admitting: Vascular Surgery

## 2021-08-20 ENCOUNTER — Ambulatory Visit: Payer: Medicare HMO | Admitting: Vascular Surgery

## 2021-08-20 ENCOUNTER — Other Ambulatory Visit: Payer: Self-pay

## 2021-08-20 VITALS — BP 127/81 | HR 98 | Temp 97.3°F | Resp 20 | Ht 63.0 in | Wt 140.0 lb

## 2021-08-20 DIAGNOSIS — I70261 Atherosclerosis of native arteries of extremities with gangrene, right leg: Secondary | ICD-10-CM | POA: Diagnosis not present

## 2021-08-23 ENCOUNTER — Other Ambulatory Visit: Payer: Self-pay

## 2021-08-25 ENCOUNTER — Encounter (HOSPITAL_COMMUNITY)
Admission: RE | Disposition: A | Discharge status: Home / Self Care | Payer: Self-pay | Source: Home / Self Care | Attending: Vascular Surgery

## 2021-08-25 ENCOUNTER — Other Ambulatory Visit: Payer: Self-pay

## 2021-08-25 ENCOUNTER — Ambulatory Visit (HOSPITAL_COMMUNITY)
Admission: RE | Admit: 2021-08-25 | Discharge: 2021-08-25 | Disposition: A | Discharge status: Home / Self Care | Payer: Medicare HMO | Attending: Vascular Surgery | Admitting: Vascular Surgery

## 2021-08-25 DIAGNOSIS — L03115 Cellulitis of right lower limb: Secondary | ICD-10-CM | POA: Insufficient documentation

## 2021-08-25 DIAGNOSIS — I70235 Atherosclerosis of native arteries of right leg with ulceration of other part of foot: Secondary | ICD-10-CM | POA: Insufficient documentation

## 2021-08-25 DIAGNOSIS — E11621 Type 2 diabetes mellitus with foot ulcer: Secondary | ICD-10-CM | POA: Diagnosis not present

## 2021-08-25 DIAGNOSIS — I10 Essential (primary) hypertension: Secondary | ICD-10-CM | POA: Diagnosis not present

## 2021-08-25 DIAGNOSIS — Z9221 Personal history of antineoplastic chemotherapy: Secondary | ICD-10-CM | POA: Diagnosis not present

## 2021-08-25 DIAGNOSIS — Z853 Personal history of malignant neoplasm of breast: Secondary | ICD-10-CM | POA: Insufficient documentation

## 2021-08-25 DIAGNOSIS — Z79899 Other long term (current) drug therapy: Secondary | ICD-10-CM | POA: Insufficient documentation

## 2021-08-25 DIAGNOSIS — K219 Gastro-esophageal reflux disease without esophagitis: Secondary | ICD-10-CM | POA: Insufficient documentation

## 2021-08-25 DIAGNOSIS — E785 Hyperlipidemia, unspecified: Secondary | ICD-10-CM | POA: Diagnosis not present

## 2021-08-25 DIAGNOSIS — E1151 Type 2 diabetes mellitus with diabetic peripheral angiopathy without gangrene: Secondary | ICD-10-CM | POA: Diagnosis not present

## 2021-08-25 DIAGNOSIS — R600 Localized edema: Secondary | ICD-10-CM | POA: Diagnosis not present

## 2021-08-25 DIAGNOSIS — L97519 Non-pressure chronic ulcer of other part of right foot with unspecified severity: Secondary | ICD-10-CM | POA: Insufficient documentation

## 2021-08-25 DIAGNOSIS — Z7982 Long term (current) use of aspirin: Secondary | ICD-10-CM | POA: Diagnosis not present

## 2021-08-25 HISTORY — PX: ABDOMINAL AORTOGRAM W/LOWER EXTREMITY: CATH118223

## 2021-08-25 LAB — POCT I-STAT, CHEM 8
BUN: 27 mg/dL — ABNORMAL HIGH (ref 8–23)
Calcium, Ion: 1.23 mmol/L (ref 1.15–1.40)
Chloride: 94 mmol/L — ABNORMAL LOW (ref 98–111)
Creatinine, Ser: 1.2 mg/dL — ABNORMAL HIGH (ref 0.44–1.00)
Glucose, Bld: 109 mg/dL — ABNORMAL HIGH (ref 70–99)
HCT: 35 % — ABNORMAL LOW (ref 36.0–46.0)
Hemoglobin: 11.9 g/dL — ABNORMAL LOW (ref 12.0–15.0)
Potassium: 3.5 mmol/L (ref 3.5–5.1)
Sodium: 134 mmol/L — ABNORMAL LOW (ref 135–145)
TCO2: 31 mmol/L (ref 22–32)

## 2021-08-25 SURGERY — ABDOMINAL AORTOGRAM W/LOWER EXTREMITY
Anesthesia: LOCAL | Laterality: Right

## 2021-08-25 MED ORDER — ACETAMINOPHEN 325 MG PO TABS: 650.0000 mg | ORAL_TABLET | ORAL | Status: DC | PRN

## 2021-08-25 MED ORDER — FENTANYL CITRATE (PF) 100 MCG/2ML IJ SOLN
INTRAMUSCULAR | Status: DC | PRN
  Administered 2021-08-25: 50 ug via INTRAVENOUS

## 2021-08-25 MED ORDER — CLOPIDOGREL BISULFATE 300 MG PO TABS
ORAL_TABLET | ORAL | Status: AC
  Filled 2021-08-25: qty 1

## 2021-08-25 MED ORDER — MIDAZOLAM HCL 2 MG/2ML IJ SOLN
INTRAMUSCULAR | Status: DC | PRN
  Administered 2021-08-25: 1 mg via INTRAVENOUS

## 2021-08-25 MED ORDER — CLOPIDOGREL BISULFATE 75 MG PO TABS: 75.0000 mg | ORAL_TABLET | Freq: Every day | ORAL | Status: DC

## 2021-08-25 MED ORDER — HEPARIN SODIUM (PORCINE) 1000 UNIT/ML IJ SOLN
INTRAMUSCULAR | Status: DC | PRN
  Administered 2021-08-25: 6000 [IU] via INTRAVENOUS

## 2021-08-25 MED ORDER — HEPARIN (PORCINE) IN NACL 1000-0.9 UT/500ML-% IV SOLN
INTRAVENOUS | Status: DC | PRN
  Administered 2021-08-25 (×2): 500 mL

## 2021-08-25 MED ORDER — CLOPIDOGREL BISULFATE 75 MG PO TABS: 75.0000 mg | ORAL_TABLET | Freq: Every day | ORAL | 11 refills | Status: DC

## 2021-08-25 MED ORDER — LIDOCAINE HCL (PF) 1 % IJ SOLN
INTRAMUSCULAR | Status: DC | PRN
  Administered 2021-08-25: 10 mL

## 2021-08-25 MED ORDER — SODIUM CHLORIDE 0.9% FLUSH: 3.0000 mL | Freq: Two times a day (BID) | INTRAVENOUS | Status: DC

## 2021-08-25 MED ORDER — HYDRALAZINE HCL 20 MG/ML IJ SOLN: 5.0000 mg | INTRAMUSCULAR | Status: DC | PRN

## 2021-08-25 MED ORDER — FENTANYL CITRATE (PF) 100 MCG/2ML IJ SOLN
INTRAMUSCULAR | Status: AC
  Filled 2021-08-25: qty 2

## 2021-08-25 MED ORDER — ASPIRIN EC 81 MG PO TBEC: 81.0000 mg | DELAYED_RELEASE_TABLET | Freq: Every day | ORAL | Status: DC

## 2021-08-25 MED ORDER — LIDOCAINE HCL (PF) 1 % IJ SOLN
INTRAMUSCULAR | Status: AC
  Filled 2021-08-25: qty 30

## 2021-08-25 MED ORDER — ONDANSETRON HCL 4 MG/2ML IJ SOLN: 4.0000 mg | Freq: Four times a day (QID) | INTRAMUSCULAR | Status: DC | PRN

## 2021-08-25 MED ORDER — AMOXICILLIN-POT CLAVULANATE 875-125 MG PO TABS: 1.0000 | ORAL_TABLET | Freq: Two times a day (BID) | ORAL | 0 refills | Status: AC

## 2021-08-25 MED ORDER — SODIUM CHLORIDE 0.9 % IV SOLN: 250.0000 mL | INTRAVENOUS | Status: DC | PRN

## 2021-08-25 MED ORDER — HEPARIN SODIUM (PORCINE) 1000 UNIT/ML IJ SOLN
INTRAMUSCULAR | Status: AC
  Filled 2021-08-25: qty 10

## 2021-08-25 MED ORDER — LABETALOL HCL 5 MG/ML IV SOLN: 10.0000 mg | INTRAVENOUS | Status: DC | PRN

## 2021-08-25 MED ORDER — SODIUM CHLORIDE 0.9 % WEIGHT BASED INFUSION: 1.0000 mL/kg/h | INTRAVENOUS | Status: DC

## 2021-08-25 MED ORDER — SODIUM CHLORIDE 0.9% FLUSH: 3.0000 mL | INTRAVENOUS | Status: DC | PRN

## 2021-08-25 MED ORDER — CLOPIDOGREL BISULFATE 300 MG PO TABS
ORAL_TABLET | ORAL | Status: DC | PRN
  Administered 2021-08-25: 300 mg via ORAL

## 2021-08-25 MED ORDER — HEPARIN (PORCINE) IN NACL 1000-0.9 UT/500ML-% IV SOLN
INTRAVENOUS | Status: AC
  Filled 2021-08-25: qty 1000

## 2021-08-25 MED ORDER — IODIXANOL 320 MG/ML IV SOLN
INTRAVENOUS | Status: DC | PRN
Start: 2021-08-25 — End: 2021-08-25
  Administered 2021-08-25: 110 mL

## 2021-08-25 MED ORDER — MIDAZOLAM HCL 2 MG/2ML IJ SOLN
INTRAMUSCULAR | Status: AC
  Filled 2021-08-25: qty 2

## 2021-08-25 MED ORDER — SODIUM CHLORIDE 0.9 % IV SOLN: INTRAVENOUS | Status: DC

## 2021-08-25 SURGICAL SUPPLY — 20 items
BALLN IN.PACT DCB 5X40 (BALLOONS)
BALLN MUSTANG 5.0X40 135 (BALLOONS) ×2
BALLOON MUSTANG 5.0X40 135 (BALLOONS) IMPLANT
CATH OMNI FLUSH 5F 65CM (CATHETERS) ×1 IMPLANT
CATH QUICKCROSS .035X135CM (MICROCATHETER) ×1 IMPLANT
DCB IN.PACT 5X40 (BALLOONS) IMPLANT
DEVICE CLOSURE MYNXGRIP 6/7F (Vascular Products) ×1 IMPLANT
GLIDEWIRE ADV .035X260CM (WIRE) ×1 IMPLANT
KIT ENCORE 26 ADVANTAGE (KITS) ×1 IMPLANT
KIT MICROPUNCTURE NIT STIFF (SHEATH) ×1 IMPLANT
KIT PV (KITS) ×3 IMPLANT
SHEATH FLEX ANSEL ANG 6F 45CM (SHEATH) ×1 IMPLANT
SHEATH PINNACLE 5F 10CM (SHEATH) ×1 IMPLANT
SHEATH PINNACLE 6F 10CM (SHEATH) ×1 IMPLANT
SHEATH PROBE COVER 6X72 (BAG) ×1 IMPLANT
STENT INNOVA 6X40X130 (Permanent Stent) ×1 IMPLANT
SYR MEDRAD MARK 7 150ML (SYRINGE) ×3 IMPLANT
TRANSDUCER W/STOPCOCK (MISCELLANEOUS) ×3 IMPLANT
TRAY PV CATH (CUSTOM PROCEDURE TRAY) ×3 IMPLANT
WIRE BENTSON .035X145CM (WIRE) ×1 IMPLANT

## 2021-08-25 NOTE — Progress Notes (Signed)
Pt ambulated without difficulty or bleeding.   Discharged home with granddaughter who will drive and stay with pt x 24 hrs  

## 2021-08-25 NOTE — Op Note (Signed)
? ? ?Patient name: [name] MRN: [id number] DOB: 08-31-1950 Sex: female ? ?08/25/2021 ?Pre-operative Diagnosis: Right lower extremity [place] 5 critical limb ischemia ?Post-operative diagnosis:  Same ?Surgeon:  Broadus John, MD ?Procedure Performed: ?1.  Ultrasound-guided micropuncture access of the left common femoral artery ?2.  Aortogram ?3.  Second-order cannulation ?4.  Right lower extremity angiogram ?5.  6 x 40 mm drug-eluting stent to the superficial femoral artery, postdilated with a 5 mm balloon ?6.  Access managed with minx device ?7.  39 minutes moderate sedation ?8.  110 mL contrast ? ? ?Indications:  [name] is a 71 y.o. female presenting with a 52-monthhistory of nonhealing wounds on the right second and third toes.  Distribution is consistent with blue toe syndrome.  She denies symptoms of claudication, but does have pain at the toes.  CT angiogram demonstrated no discernible lesions that could be a nidus for disease, however there is mild, diffuse atherosclerotic disease.  Echo was also negative. SAnadeliahas a history of breast cancer and was on several chemotherapeutic agents in the early 2000's which could be a driver.   ?  ?I had a long discussion with SQuanikaregarding the above.  Being that her toe pressures are below the necessary level needed for wound healing, my concern is, that should she need a toe amputation for necrosis, the site may not heal.  After discussing the risk and benefits of right lower extremity angiography in an effort to define and improve distal perfusion, SKyrstanelected to proceed. ?I asked that she continue her statin medication and '81mg'$  aspirin therapy daily. ?I will also ensure Dr. [name] attached this message for close follow-up. ? ?Findings:  ?Aortogram: Normal bilateral renal arteries, normal infrarenal abdominal aorta, no flow-limiting stenosis in bilateral iliac arteries ? ?Right lower extremity: Normal common femoral artery, normal  profunda, focal mid SFA lesion which was concerning for thrombotic nidus.  Normal popliteal artery.  Three-vessel runoff to the foot filling plantar branches to the forefoot.  Pedal arch appears patent.  Beyond the forefoot, small vessel disease with no appreciable filling at the level of the toes. ? ? ?Procedure:  The patient was identified in the holding area and taken to room 8.  The patient was then placed supine on the table and prepped and draped in the usual sterile fashion.  A time out was called.  Ultrasound was used to evaluate the left common femoral artery.  It was patent .  A digital ultrasound image was acquired.  A micropuncture needle was used to access the left common femoral artery under ultrasound guidance.  An 018 wire was advanced without resistance and a micropuncture sheath was placed.  The 018 wire was removed and a benson wire was placed.  The micropuncture sheath was exchanged for a 5 french sheath.  An omniflush catheter was advanced over the wire to the level of L-1.  An abdominal angiogram was obtained.  Next, using the omniflush catheter and a benson wire, the aortic bifurcation was crossed and the catheter was placed into theright external iliac artery and right runoff was obtained.  ? ?I elected to intervene on the right-sided superficial femoral artery lesion.  A 6 x 55 cm sheath was brought onto the field and parked in the proximal superficial femoral artery.  The lesion was crossed using a series of wires and catheters.  True lumen was confirmed distally with use of angiography.  Next, a 6 x 40 mm drug-eluting  stent was brought onto the field and positioned across the lesion and deployed.  This was followed by 5 x 40 mm balloon angioplasty.  Follow-up angiography demonstrated excellent result with resolution of the lesion. ? ?Left-sided common femoral artery access was managed using a minx device. ? ?I called her podiatrist, Dr. Cannon Kettle regarding the results above.  She will ensure  shorter follow-up. ?Patient with bilateral lower extremity edema, some erythema in the right calf.  This is concerning for mild cellulitis.  14-day course of antibiotics was administered prior to discharge.  She is asked to present to the hospital should this worsen. ? ? ?[name], MD ?[facility] ?Office: [phone] ? ? ? ?

## 2021-08-25 NOTE — H&P (Addendum)
?Office Note  ? ?Patient seen and examined in preop holding.  No complaints. ?No changes to medication history. Physical exam demonstrated new cellulitis that evolved over the last few days. Will move forward with intervention. Pt does not want to be admitted to the hospital. Will discuss her course after intervention.  ?After discussing the risks and benefits of right leg angiogram, [name] elected to proceed.  ? ?_____ ? ?Status post angiogram right leg reevaluated.  Mild cellulitis, with some dependent rubor.  Center denied fevers, chills.  No signs of ascending infection.  I reached out to her podiatrist, Dr. Cannon Kettle, and will ensure short-term follow-up.  She will be placed on a 14-day course of oral antibiotics, and was advised to present to the ED immediately should her cellulitis worsen. ? ?Brandy John MD ? ? ?CC: Right second and third toe discoloration ?Requesting Provider:  No ref. provider found ? ?HPI: [name] is a 71 y.o. (1951/02/19) female presenting in follow up for right second and third toe discoloration.  ? ?At her last visit, she was diagnosed with blue toe syndrome.  CT angio chest abdomen pelvis with runoff was ordered in an effort to elucidate a possible nidus.  This and echo were negative.  She presents today to discuss the above findings and for wound check. ? ?On exam, [name] was doing well, accompanied by her daughter.  She noted improvement in her right second toe, but worsening in the right third toe, with dry gangrene at the tip.  He has had waxing and waning pain that involves a small portion of her forefoot.  She denies further drainage.She denies symptoms of claudication, ischemic rest pain.  Her left leg is asymptomatic. ? ? ?The pt is  on a statin for cholesterol management.  ?The pt is not on a daily aspirin.   Other AC:  - ?The pt is  on medication for hypertension.   ?The pt is  diabetic.  ?Tobacco hx:  none ? ?Past Medical History:  ?Diagnosis Date  ?  Arthritis   ? joints  ? Asthma   ? allergic  ? GERD (gastroesophageal reflux disease)   ? H/O hiatal hernia   ? Heart murmur   ? not MVP no treatment  ? Hyperlipidemia   ? Hypertension   ? Insomnia   ? Osteopenia   ? Osteoporosis   ? PONV (postoperative nausea and vomiting)   ? Rosacea   ? neck,cheeks  ? ? ?Past Surgical History:  ?Procedure Laterality Date  ? ABDOMINAL HYSTERECTOMY    ? Bladder Tack    ? Blader Tack    ? BREAST LUMPECTOMY  04-1999  ? BREAST LUMPECTOMY  05-1999  ? COLON SURGERY  07-2010  ? COLONOSCOPY W/ POLYPECTOMY    ? EDG  with polypectomy (gastric polyps)    ? EUS  05/24/2012  ? Procedure: UPPER ENDOSCOPIC ULTRASOUND (EUS) LINEAR;  Surgeon: Milus Banister, MD;  Location: [facility];  Service: Endoscopy;  Laterality: N/A;  ? Lumpectomy 07-2000  2002  ? Port a cath Insertion  2001  ? PORT-A-CATH REMOVAL  05-2000  ? TONSILLECTOMY    ? ? ?Social History  ? ?Socioeconomic History  ? Marital status: Widowed  ?  Spouse name: Not on file  ? Number of children: Not on file  ? Years of education: Not on file  ? Highest education level: Not on file  ?Occupational History  ? Not on file  ?Tobacco Use  ?  Smoking status: Never  ? Smokeless tobacco: Never  ?Substance and Sexual Activity  ? Alcohol use: No  ? Drug use: No  ? Sexual activity: Not on file  ?Other Topics Concern  ? Not on file  ?Social History Narrative  ? Not on file  ? ?Social Determinants of Health  ? ?Financial Resource Strain: Not on file  ?Food Insecurity: Not on file  ?Transportation Needs: Not on file  ?Physical Activity: Not on file  ?Stress: Not on file  ?Social Connections: Not on file  ?Intimate Partner Violence: Not on file  ? ?No family history on file. ? ?Current Facility-Administered Medications  ?Medication Dose Route Frequency Provider Last Rate Last Admin  ? 0.9 %  sodium chloride infusion   Intravenous Continuous Brandy John, MD 100 mL/hr at 08/25/21 0840 New Bag at 08/25/21 0840  ? ? ?Allergies  ?Allergen Reactions  ?  Latex Itching  ? Levofloxacin   ?  Possible tendon rupture  ? Morphine And Related Nausea Only  ? ? ? ?REVIEW OF SYSTEMS:  ? ?'[X]'$  denotes positive finding, '[ ]'$  denotes negative finding ?Cardiac  Comments:  ?Chest pain or chest pressure:    ?Shortness of breath upon exertion:    ?Short of breath when lying flat:    ?Irregular heart rhythm:    ?    ?Vascular    ?Pain in calf, thigh, or hip brought on by ambulation:    ?Pain in feet at night that wakes you up from your sleep:     ?Blood clot in your veins:    ?Leg swelling:     ?    ?Pulmonary    ?Oxygen at home:    ?Productive cough:     ?Wheezing:     ?    ?Neurologic    ?Sudden weakness in arms or legs:     ?Sudden numbness in arms or legs:     ?Sudden onset of difficulty speaking or slurred speech:    ?Temporary loss of vision in one eye:     ?Problems with dizziness:     ?    ?Gastrointestinal    ?Blood in stool:     ?Vomited blood:     ?    ?Genitourinary    ?Burning when urinating:     ?Blood in urine:    ?    ?Psychiatric    ?Major depression:     ?    ?Hematologic    ?Bleeding problems:    ?Problems with blood clotting too easily:    ?    ?Skin    ?Rashes or ulcers:    ?    ?Constitutional    ?Fever or chills:    ? ? ?PHYSICAL EXAMINATION: ? ?Vitals:  ? 08/25/21 0832  ?BP: (!) 141/72  ?Pulse: 90  ?Resp: 17  ?Temp: 98.4 ?F (36.9 ?C)  ?TempSrc: Oral  ?SpO2: 100%  ?Weight: 63.5 kg  ?Height: '5\' 3"'$  (1.6 m)  ? ? ?General:  WDWN in NAD; vital signs documented above ?Gait: Not observed ?HENT: WNL, normocephalic ?Pulmonary: normal non-labored breathing , without wheezing ?Cardiac: regular HR,  ?Abdomen: soft, NT, no masses ?Skin: without rashes ?Vascular Exam/Pulses: ? Right Left  ?Radial 2+ (normal) 2+ (normal)  ?Ulnar 2+ (normal) 2+ (normal)  ?Femoral 2+ (normal) 2+ (normal)  ?Popliteal    ?DP 2+ (normal) 2+ (normal)  ?PT    ? ?Extremities: with ischemic changes, without Gangrene , without cellulitis; with open wounds at the right second  and third toe  nailbeds ? ? ? ? ? ? ? ? ? ? ?Musculoskeletal: no muscle wasting or atrophy ?Lymphedema appreciated in bilateral lower extremities  ?Neurologic: A&O X 3;  No focal weakness or paresthesias are detected ?Psychiatric:  The pt has Normal affect. ? ? ?Non-Invasive Vascular Imaging:   ?Summary:  ?Right: Resting right ankle-brachial index indicates noncompressible right lower extremity arteries. Biphasic waveforms suggest adequate perfusion.  ?The right toe-brachial index is abnormal. Decreased PPG waveform right 2nd and 3rd toes.  ?Great toe pressure 54 ? ?Left: Resting left ankle-brachial index is within normal range. No  ?evidence of significant left lower extremity arterial disease. The left  ?toe-brachial index is abnormal.  ?Great toe pressure 52 ? ? ? ?ASSESSMENT/PLAN: [name] is a 71 y.o. female presenting with a 70-monthhistory of nonhealing wounds on the right second and third toes.  Distribution is consistent with blue toe syndrome.  She denies symptoms of claudication, but does have pain at the toes.  CT angiogram demonstrated no discernible lesions that could be a nidus for disease, however there is mild, diffuse atherosclerotic disease.  Echo was also negative. SMerylehas a history of breast cancer and was on several chemotherapeutic agents in the early 2000's which could be a driver.   ? ?I had a long discussion with SCaliseregarding the above.  Being that her toe pressures are below the necessary level needed for wound healing, my concern is, that should she need a toe amputation for necrosis, the site may not heal.  After discussing the risk and benefits of right lower extremity angiography in an effort to define and improve distal perfusion, STriciaelected to proceed. ? ?I asked that she continue her statin medication and '81mg'$  aspirin therapy daily. ? ?I will also ensure Dr. [name] attached this message for close follow-up. ? ?JBroadus John MD ?[facility] ?[phone]? ?

## 2021-08-25 NOTE — Discharge Instructions (Signed)
Femoral Site Care This sheet gives you information about how to care for yourself after your procedure. Your health care provider may also give you more specific instructions. If you have problems or questions, contact your health care provider. What can I expect after the procedure? After the procedure, it is common to have: Bruising that usually fades within 1-2 weeks. Tenderness at the site. Follow these instructions at home: Wound care May remove bandage after 24 hours. Do not take baths, swim, or use a hot tub for 5 days. You may shower 24-48 hours after the procedure. Gently wash the site with plain soap and water. Pat the area dry with a clean towel. Do not rub the site. This may cause bleeding. Do not apply powder or lotion to the site. Keep the site clean and dry. Check your femoral site every day for signs of infection. Check for: Redness, swelling, or pain. Fluid or blood. Warmth. Pus or a bad smell. Activity For the first 2-3 days after your procedure, or as long as directed: Avoid climbing stairs as much as possible. Do not squat. Do not lift, push or pull anything that is heavier than 10 lb for 5 days. Rest as directed. Avoid sitting for a long time without moving. Get up to take short walks every 1-2 hours. Do not drive for 24 hours. General instructions Take over-the-counter and prescription medicines only as told by your health care provider. Keep all follow-up visits as told by your health care provider. This is important. DRINK PLENTY OF FLUIDS FOR THE NEXT 2-3 DAYS. Contact a health care provider if you have: A fever or chills. You have redness, swelling, or pain around your insertion site. Get help right away if: The catheter insertion area swells very fast. You pass out. You suddenly start to sweat or your skin gets clammy. The catheter insertion area is bleeding, and the bleeding does not stop when you hold steady pressure on the area. The area near or  just beyond the catheter insertion site becomes pale, cool, tingly, or numb. These symptoms may represent a serious problem that is an emergency. Do not wait to see if the symptoms will go away. Get medical help right away. Call your local emergency services (911 in the [place] Do not drive yourself to the hospital. Summary After the procedure, it is common to have bruising that usually fades within 1-2 weeks. Check your femoral site every day for signs of infection. Do not lift, push or pull anything that is heavier than 10 lb for 5 days.  This information is not intended to replace advice given to you by your health care provider. Make sure you discuss any questions you have with your health care provider. Document Revised: 06/05/2017 Document Reviewed: 06/05/2017 Elsevier Patient Education  2020 [employer]  

## 2021-08-26 ENCOUNTER — Encounter (HOSPITAL_COMMUNITY): Payer: Self-pay | Admitting: Vascular Surgery

## 2021-08-30 DIAGNOSIS — I739 Peripheral vascular disease, unspecified: Secondary | ICD-10-CM | POA: Diagnosis not present

## 2021-08-30 DIAGNOSIS — G609 Hereditary and idiopathic neuropathy, unspecified: Secondary | ICD-10-CM | POA: Diagnosis not present

## 2021-08-30 DIAGNOSIS — M1990 Unspecified osteoarthritis, unspecified site: Secondary | ICD-10-CM | POA: Diagnosis not present

## 2021-08-30 DIAGNOSIS — R519 Headache, unspecified: Secondary | ICD-10-CM | POA: Diagnosis not present

## 2021-08-30 DIAGNOSIS — F325 Major depressive disorder, single episode, in full remission: Secondary | ICD-10-CM | POA: Diagnosis not present

## 2021-08-30 DIAGNOSIS — E559 Vitamin D deficiency, unspecified: Secondary | ICD-10-CM | POA: Diagnosis not present

## 2021-08-30 DIAGNOSIS — J309 Allergic rhinitis, unspecified: Secondary | ICD-10-CM | POA: Diagnosis not present

## 2021-08-30 DIAGNOSIS — R600 Localized edema: Secondary | ICD-10-CM | POA: Diagnosis not present

## 2021-08-30 DIAGNOSIS — C50919 Malignant neoplasm of unspecified site of unspecified female breast: Secondary | ICD-10-CM | POA: Diagnosis not present

## 2021-09-01 ENCOUNTER — Ambulatory Visit (INDEPENDENT_AMBULATORY_CARE_PROVIDER_SITE_OTHER): Payer: Medicare HMO

## 2021-09-01 ENCOUNTER — Ambulatory Visit: Payer: Medicare HMO | Admitting: Sports Medicine

## 2021-09-01 ENCOUNTER — Other Ambulatory Visit: Payer: Self-pay

## 2021-09-01 ENCOUNTER — Encounter: Payer: Self-pay | Admitting: Sports Medicine

## 2021-09-01 DIAGNOSIS — M2041 Other hammer toe(s) (acquired), right foot: Secondary | ICD-10-CM | POA: Diagnosis not present

## 2021-09-01 DIAGNOSIS — I739 Peripheral vascular disease, unspecified: Secondary | ICD-10-CM | POA: Diagnosis not present

## 2021-09-01 DIAGNOSIS — L03115 Cellulitis of right lower limb: Secondary | ICD-10-CM | POA: Diagnosis not present

## 2021-09-01 DIAGNOSIS — I96 Gangrene, not elsewhere classified: Secondary | ICD-10-CM

## 2021-09-01 NOTE — Progress Notes (Signed)
Subjective: ?[name] is a 71 y.o. female patient who presents to office for follow-up evaluation of right third toe gangrene.  Patient reports that it seems to be doing about the same but the redness and swelling since being on Augmentin seems to be helping.  Patient denies nausea vomiting fever chills or any constitutional symptoms at this time.  Patient is status post vascular intervention with Dr. Virl Cagey. ? ?Patient Active Problem List  ? Diagnosis Date Noted  ? Gastric mass 05/24/2012  ? ? ?Current Outpatient Medications on File Prior to Visit  ?Medication Sig Dispense Refill  ? amitriptyline (ELAVIL) 75 MG tablet Take 75 mg by mouth at bedtime.    ? amoxicillin-clavulanate (AUGMENTIN) 875-125 MG tablet Take 1 tablet by mouth 2 (two) times daily for 14 days. 28 tablet 0  ? aspirin EC 81 MG tablet Take 81 mg by mouth daily. Swallow whole.    ? atorvastatin (LIPITOR) 20 MG tablet Take 20 mg by mouth daily.    ? calcium carbonate (OS-CAL) 600 MG TABS Take 600 mg by mouth 2 (two) times daily with a meal. Vitamin D 400 mg    ? celecoxib (CELEBREX) 200 MG capsule Take 200 mg by mouth 2 (two) times daily.    ? Cholecalciferol (VITAMIN D3 PO) Take 5,000 Units by mouth daily.    ? clopidogrel (PLAVIX) 75 MG tablet Take 1 tablet (75 mg total) by mouth daily. 30 tablet 11  ? Coenzyme Q10 (COQ10 PO) Take 200 mg by mouth daily.    ? fluticasone (FLONASE) 50 MCG/ACT nasal spray Place 1 spray into both nostrils daily as needed for allergies or rhinitis.    ? furosemide (LASIX) 40 MG tablet Take 40 mg by mouth 2 (two) times daily as needed for fluid or edema.    ? gabapentin (NEURONTIN) 600 MG tablet Take 600 mg by mouth 2 (two) times daily.    ? losartan-hydrochlorothiazide (HYZAAR) 100-25 MG tablet Take 1 tablet by mouth daily.    ? MAGNESIUM PO Take 400 mg by mouth at bedtime.    ? montelukast (SINGULAIR) 10 MG tablet Take 10 mg by mouth at bedtime.    ? Multiple Vitamin (MULTIVITAMIN) tablet Take 1 tablet by  mouth daily.    ? neomycin-bacitracin-polymyxin (NEOSPORIN) ointment Apply 1 application. topically daily as needed for wound care.    ? pantoprazole (PROTONIX) 40 MG tablet Take 40 mg by mouth daily.    ? raloxifene (EVISTA) 60 MG tablet Take 60 mg by mouth daily.    ? ?Current Facility-Administered Medications on File Prior to Visit  ?Medication Dose Route Frequency Provider Last Rate Last Admin  ? triamcinolone acetonide (KENALOG) 10 MG/ML injection 10 mg  10 mg Other Once Landis Martins, DPM      ? ? ?Allergies  ?Allergen Reactions  ? Latex Itching  ? Levofloxacin   ?  Possible tendon rupture  ? Morphine And Related Nausea Only  ? ? ?Objective:  ?General: Alert and oriented x3 in no acute distress ? ?Dermatology: Nails are short and thickened.  There is focal dry necrosis at the distal tuft of the right third toe that appears to be the same as previous encounter with vascular not changing.  There is no significant erythema to the foot however to the lower leg there is 1+ pitting edema and erythema that is blanchable in nature possible cellulitis versus PVD changes. ? ?Vascular: Dorsalis Pedis and Posterior Tibial pedal pulses are faint with blue discoloration to the  plantar surfaces of both feet with pitting edema as noted above. ? ?Neurology: Gross sensation intact via light touch bilateral. ? ?Musculoskeletal: Minimal pain to palpation to the right foot.  Pes planus foot type. ? ?X-rays negative for any acute osseous findings at the distal tuft of the right third toe ? ? ?Assessment and Plan: ?Problem List Items Addressed This Visit   ?None ?Visit Diagnoses   ? ? Gangrene of toe of right foot ([place])    -  Primary  ? PVD (peripheral vascular disease) ([place])      ? Relevant Orders  ? DG Foot Complete Right  ? Cellulitis of leg without foot, right      ? ?  ?  ? ?-Complete examination performed ?-X-rays reviewed ?-Advised patient to avoid soaking or getting the right foot wet however did recommend for her to use  Betadine as I have directed at the distal tuft of the right third toe advised patient at this point we will continue to monitor the gangrene right now it is dry and not progressing and may auto amputate OM may require further surgery however at this time advised patient that she is at increased risk of limb loss ?-Advised patient to continue with antibiotics as given by vascular and on next week if her leg is still red to call to notify my office for [place] to put her on Keflex or to have her to come in tomorrow to be rechecked ?-Patient to return to office 10 to 14 days or sooner if condition worsens. ? ?Landis Martins, DPM ? ?

## 2021-09-14 ENCOUNTER — Encounter: Payer: Self-pay | Admitting: Sports Medicine

## 2021-09-14 ENCOUNTER — Ambulatory Visit (INDEPENDENT_AMBULATORY_CARE_PROVIDER_SITE_OTHER): Payer: Medicare HMO | Admitting: Sports Medicine

## 2021-09-14 VITALS — Temp 98.2°F

## 2021-09-14 DIAGNOSIS — C50919 Malignant neoplasm of unspecified site of unspecified female breast: Secondary | ICD-10-CM | POA: Diagnosis not present

## 2021-09-14 DIAGNOSIS — R600 Localized edema: Secondary | ICD-10-CM | POA: Diagnosis not present

## 2021-09-14 DIAGNOSIS — F325 Major depressive disorder, single episode, in full remission: Secondary | ICD-10-CM | POA: Diagnosis not present

## 2021-09-14 DIAGNOSIS — L03115 Cellulitis of right lower limb: Secondary | ICD-10-CM

## 2021-09-14 DIAGNOSIS — M1990 Unspecified osteoarthritis, unspecified site: Secondary | ICD-10-CM | POA: Diagnosis not present

## 2021-09-14 DIAGNOSIS — E559 Vitamin D deficiency, unspecified: Secondary | ICD-10-CM | POA: Diagnosis not present

## 2021-09-14 DIAGNOSIS — I96 Gangrene, not elsewhere classified: Secondary | ICD-10-CM

## 2021-09-14 DIAGNOSIS — R519 Headache, unspecified: Secondary | ICD-10-CM | POA: Diagnosis not present

## 2021-09-14 DIAGNOSIS — I739 Peripheral vascular disease, unspecified: Secondary | ICD-10-CM

## 2021-09-14 DIAGNOSIS — J309 Allergic rhinitis, unspecified: Secondary | ICD-10-CM | POA: Diagnosis not present

## 2021-09-14 DIAGNOSIS — G609 Hereditary and idiopathic neuropathy, unspecified: Secondary | ICD-10-CM | POA: Diagnosis not present

## 2021-09-14 MED ORDER — AMOXICILLIN-POT CLAVULANATE 875-125 MG PO TABS: 1.0000 | ORAL_TABLET | Freq: Two times a day (BID) | ORAL | 0 refills | Status: DC

## 2021-09-14 MED ORDER — TRAMADOL HCL 50 MG PO TABS: 50.0000 mg | ORAL_TABLET | Freq: Four times a day (QID) | ORAL | 0 refills | Status: AC | PRN

## 2021-09-14 NOTE — Progress Notes (Addendum)
Subjective: ?[name] is a 71 y.o. female patient who presents to office for follow-up evaluation of right third toe gangrene and states that she was given antibiotics but ran out on Thursday.  Patient denies nausea vomiting fever chills and states that she was called and told that her orthotics are here.  Patient denies any other pedal complaints at this time. ? ?Patient Active Problem List  ? Diagnosis Date Noted  ? Gastric mass 05/24/2012  ? ? ?Current Outpatient Medications on File Prior to Visit  ?Medication Sig Dispense Refill  ? amitriptyline (ELAVIL) 75 MG tablet Take 75 mg by mouth at bedtime.    ? aspirin EC 81 MG tablet Take 81 mg by mouth daily. Swallow whole.    ? atorvastatin (LIPITOR) 20 MG tablet Take 20 mg by mouth daily.    ? calcium carbonate (OS-CAL) 600 MG TABS Take 600 mg by mouth 2 (two) times daily with a meal. Vitamin D 400 mg    ? celecoxib (CELEBREX) 200 MG capsule Take 200 mg by mouth 2 (two) times daily.    ? Cholecalciferol (VITAMIN D3 PO) Take 5,000 Units by mouth daily.    ? clopidogrel (PLAVIX) 75 MG tablet Take 1 tablet (75 mg total) by mouth daily. 30 tablet 11  ? Coenzyme Q10 (COQ10 PO) Take 200 mg by mouth daily.    ? fluticasone (FLONASE) 50 MCG/ACT nasal spray Place 1 spray into both nostrils daily as needed for allergies or rhinitis.    ? furosemide (LASIX) 40 MG tablet Take 40 mg by mouth 2 (two) times daily as needed for fluid or edema.    ? gabapentin (NEURONTIN) 600 MG tablet Take 600 mg by mouth 2 (two) times daily.    ? losartan-hydrochlorothiazide (HYZAAR) 100-25 MG tablet Take 1 tablet by mouth daily.    ? MAGNESIUM PO Take 400 mg by mouth at bedtime.    ? montelukast (SINGULAIR) 10 MG tablet Take 10 mg by mouth at bedtime.    ? Multiple Vitamin (MULTIVITAMIN) tablet Take 1 tablet by mouth daily.    ? neomycin-bacitracin-polymyxin (NEOSPORIN) ointment Apply 1 application. topically daily as needed for wound care.    ? pantoprazole (PROTONIX) 40 MG tablet Take  40 mg by mouth daily.    ? raloxifene (EVISTA) 60 MG tablet Take 60 mg by mouth daily.    ? ?Current Facility-Administered Medications on File Prior to Visit  ?Medication Dose Route Frequency Provider Last Rate Last Admin  ? triamcinolone acetonide (KENALOG) 10 MG/ML injection 10 mg  10 mg Other Once Landis Martins, DPM      ? ? ?Allergies  ?Allergen Reactions  ? Latex Itching  ? Levofloxacin   ?  Possible tendon rupture  ? Morphine And Related Nausea Only  ? ? ?Objective:  ?General: Alert and oriented x3 in no acute distress ? ?Dermatology: Nails are short and thickened.  There is focal dry necrosis at the distal tuft of the right third toe that appears to be the same as previous encounter.  There is no significant erythema to the foot however to the lower leg there is 1+ pitting edema and erythema that is blanchable in nature possible cellulitis versus PVD changes.  There is a small abrasion noted to the distal tuft of the right second toe.  There is also a small abrasion noted to the right anterior shin. ? ?Vascular: Dorsalis Pedis and Posterior Tibial pedal pulses are faint with blue discoloration to the plantar surfaces of both  feet with pitting edema as noted above. ? ?Neurology: Gross sensation intact via light touch bilateral. ? ?Musculoskeletal: Minimal pain to palpation to the right foot currently but per patient pain worsens at night.  Pes planus foot type. ? ? ?Assessment and Plan: ?Problem List Items Addressed This Visit   ?None ?Visit Diagnoses   ? ? Gangrene of toe of right foot ([place])    -  Primary  ? PVD (peripheral vascular disease) ([place])      ? Cellulitis of leg without foot, right      ? ?  ?  ? ?-Complete examination performed ?-Advised patient to continue with Betadine to the area of gangrene at the distal tuft of the right third toe and to the distal tuft of the right second toe.  At this time the gangrene continues to remain dry however likely patient is probably suffering from increased  vascular pain  ?-I advised patient that if she had surgery option would be a transmetatarsal amputation based on her recommendations from vascular and if that does not heal she is at risk of a more proximal amputation ?-Meanwhile for preventative measures refilled Augmentin ?-Rx tramadol for any additional pain not relieved by her gabapentin ?-Advised patient at this time if she gets orthotics she cannot wear them must wear surgical shoe was dispensed this visit until the gangrenous toe is better. ?-Patient to return to office 10 to 14 days or sooner if condition worsens. Will plan for xrays and photograph next visit. ? ?Landis Martins, DPM ? ?

## 2021-09-28 DIAGNOSIS — G609 Hereditary and idiopathic neuropathy, unspecified: Secondary | ICD-10-CM | POA: Diagnosis not present

## 2021-09-28 DIAGNOSIS — L0390 Cellulitis, unspecified: Secondary | ICD-10-CM | POA: Diagnosis not present

## 2021-09-28 DIAGNOSIS — R600 Localized edema: Secondary | ICD-10-CM | POA: Diagnosis not present

## 2021-09-28 DIAGNOSIS — E559 Vitamin D deficiency, unspecified: Secondary | ICD-10-CM | POA: Diagnosis not present

## 2021-09-28 DIAGNOSIS — M1990 Unspecified osteoarthritis, unspecified site: Secondary | ICD-10-CM | POA: Diagnosis not present

## 2021-09-28 DIAGNOSIS — J309 Allergic rhinitis, unspecified: Secondary | ICD-10-CM | POA: Diagnosis not present

## 2021-09-28 DIAGNOSIS — R519 Headache, unspecified: Secondary | ICD-10-CM | POA: Diagnosis not present

## 2021-09-28 DIAGNOSIS — C50919 Malignant neoplasm of unspecified site of unspecified female breast: Secondary | ICD-10-CM | POA: Diagnosis not present

## 2021-09-28 DIAGNOSIS — I739 Peripheral vascular disease, unspecified: Secondary | ICD-10-CM | POA: Diagnosis not present

## 2021-10-01 ENCOUNTER — Encounter: Payer: Self-pay | Admitting: Sports Medicine

## 2021-10-01 ENCOUNTER — Ambulatory Visit (INDEPENDENT_AMBULATORY_CARE_PROVIDER_SITE_OTHER): Payer: Medicare HMO

## 2021-10-01 ENCOUNTER — Ambulatory Visit: Payer: Medicare HMO | Admitting: Sports Medicine

## 2021-10-01 DIAGNOSIS — L03115 Cellulitis of right lower limb: Secondary | ICD-10-CM | POA: Diagnosis not present

## 2021-10-01 DIAGNOSIS — I739 Peripheral vascular disease, unspecified: Secondary | ICD-10-CM | POA: Diagnosis not present

## 2021-10-01 DIAGNOSIS — I96 Gangrene, not elsewhere classified: Secondary | ICD-10-CM | POA: Diagnosis not present

## 2021-10-01 NOTE — Progress Notes (Signed)
Subjective: ?[name] is a 71 y.o. female patient who presents to office for follow-up evaluation of right third toe gangrene and states that she was given antibiotics again by her PCP for her right leg since she keeps bumping it and states that her toes are doing okay.  Currently using surgical shoe as I have recommended without any issues. ? ?Patient Active Problem List  ? Diagnosis Date Noted  ? Gastric mass 05/24/2012  ? ? ?Current Outpatient Medications on File Prior to Visit  ?Medication Sig Dispense Refill  ? amitriptyline (ELAVIL) 75 MG tablet Take 75 mg by mouth at bedtime.    ? amoxicillin-clavulanate (AUGMENTIN) 875-125 MG tablet Take 1 tablet by mouth 2 (two) times daily. 28 tablet 0  ? aspirin EC 81 MG tablet Take 81 mg by mouth daily. Swallow whole.    ? atorvastatin (LIPITOR) 20 MG tablet Take 20 mg by mouth daily.    ? calcium carbonate (OS-CAL) 600 MG TABS Take 600 mg by mouth 2 (two) times daily with a meal. Vitamin D 400 mg    ? celecoxib (CELEBREX) 200 MG capsule Take 200 mg by mouth 2 (two) times daily.    ? Cholecalciferol (VITAMIN D3 PO) Take 5,000 Units by mouth daily.    ? clopidogrel (PLAVIX) 75 MG tablet Take 1 tablet (75 mg total) by mouth daily. 30 tablet 11  ? Coenzyme Q10 (COQ10 PO) Take 200 mg by mouth daily.    ? fluticasone (FLONASE) 50 MCG/ACT nasal spray Place 1 spray into both nostrils daily as needed for allergies or rhinitis.    ? furosemide (LASIX) 40 MG tablet Take 40 mg by mouth 2 (two) times daily as needed for fluid or edema.    ? gabapentin (NEURONTIN) 600 MG tablet Take 600 mg by mouth 2 (two) times daily.    ? losartan-hydrochlorothiazide (HYZAAR) 100-25 MG tablet Take 1 tablet by mouth daily.    ? MAGNESIUM PO Take 400 mg by mouth at bedtime.    ? montelukast (SINGULAIR) 10 MG tablet Take 10 mg by mouth at bedtime.    ? Multiple Vitamin (MULTIVITAMIN) tablet Take 1 tablet by mouth daily.    ? neomycin-bacitracin-polymyxin (NEOSPORIN) ointment Apply 1  application. topically daily as needed for wound care.    ? pantoprazole (PROTONIX) 40 MG tablet Take 40 mg by mouth daily.    ? raloxifene (EVISTA) 60 MG tablet Take 60 mg by mouth daily.    ? ?Current Facility-Administered Medications on File Prior to Visit  ?Medication Dose Route Frequency Provider Last Rate Last Admin  ? triamcinolone acetonide (KENALOG) 10 MG/ML injection 10 mg  10 mg Other Once Landis Martins, DPM      ? ? ?Allergies  ?Allergen Reactions  ? Latex Itching  ? Levofloxacin   ?  Possible tendon rupture  ? Morphine And Related Nausea Only  ? ? ?Objective:  ?General: Alert and oriented x3 in no acute distress ? ?Dermatology: Nails are short and thickened.  There is focal dry necrosis at the distal tuft of the right third toe that appears to be the same as previous encounter.  There is no significant erythema to the foot however to the lower leg there is 1+ pitting edema and erythema that is blanchable in nature possible cellulitis versus PVD changes.  There is a small abrasion noted to the distal tuft of the right second toe.  There is also a small abrasion noted to the right anterior shin as well from  patient repeatedly bumping her leg. ? ? ? ? ? ?Vascular: Dorsalis Pedis and Posterior Tibial pedal pulses are faint with blue discoloration to the plantar surfaces of both feet with pitting edema as noted above. ? ?Neurology: Gross sensation intact via light touch bilateral. ? ?Musculoskeletal: Minimal pain to palpation to the right foot currently but per patient pain worsens at night.  Pes planus foot type. ? ? ?Assessment and Plan: ?Problem List Items Addressed This Visit   ?None ?Visit Diagnoses   ? ? Gangrene of toe of right foot ([place])    -  Primary  ? Relevant Orders  ? DG Foot Complete Right (Completed)  ? PVD (peripheral vascular disease) ([place])      ? Cellulitis of leg without foot, right      ? ?  ?  ? ?-Complete examination performed ?-X-rays no acute osseous findings at the level of the  foot especially the toes ?-Photograph taken this visit of her right foot and the gangrenous areas at the distal tuft of the right third toe and right second toe appear to be dry and stable not changing ?-Continue with surgical shoe and application of Betadine to the toes every other day ?-Advised patient at this time we will continue to monitor the areas of gangrene if fails to improve patient is high risk for a transmetatarsal versus BKA ?-Continue with antibiotics as given by PCP ?-Continue with gabapentin and tramadol for breakthrough pain ?-Advised patient to continue to monitor her leg if the redness worsens should go to the hospital because antibiotics given by her PCP may not be working. ? ?Landis Martins, DPM ? ?

## 2021-10-04 DIAGNOSIS — E559 Vitamin D deficiency, unspecified: Secondary | ICD-10-CM | POA: Diagnosis not present

## 2021-10-04 DIAGNOSIS — J309 Allergic rhinitis, unspecified: Secondary | ICD-10-CM | POA: Diagnosis not present

## 2021-10-04 DIAGNOSIS — G609 Hereditary and idiopathic neuropathy, unspecified: Secondary | ICD-10-CM | POA: Diagnosis not present

## 2021-10-04 DIAGNOSIS — M1990 Unspecified osteoarthritis, unspecified site: Secondary | ICD-10-CM | POA: Diagnosis not present

## 2021-10-04 DIAGNOSIS — R600 Localized edema: Secondary | ICD-10-CM | POA: Diagnosis not present

## 2021-10-04 DIAGNOSIS — C50919 Malignant neoplasm of unspecified site of unspecified female breast: Secondary | ICD-10-CM | POA: Diagnosis not present

## 2021-10-04 DIAGNOSIS — I739 Peripheral vascular disease, unspecified: Secondary | ICD-10-CM | POA: Diagnosis not present

## 2021-10-04 DIAGNOSIS — L0390 Cellulitis, unspecified: Secondary | ICD-10-CM | POA: Diagnosis not present

## 2021-10-04 DIAGNOSIS — I10 Essential (primary) hypertension: Secondary | ICD-10-CM | POA: Diagnosis not present

## 2021-10-04 DIAGNOSIS — R519 Headache, unspecified: Secondary | ICD-10-CM | POA: Diagnosis not present

## 2021-10-12 DIAGNOSIS — I10 Essential (primary) hypertension: Secondary | ICD-10-CM | POA: Diagnosis not present

## 2021-10-12 DIAGNOSIS — J309 Allergic rhinitis, unspecified: Secondary | ICD-10-CM | POA: Diagnosis not present

## 2021-10-12 DIAGNOSIS — R519 Headache, unspecified: Secondary | ICD-10-CM | POA: Diagnosis not present

## 2021-10-12 DIAGNOSIS — E559 Vitamin D deficiency, unspecified: Secondary | ICD-10-CM | POA: Diagnosis not present

## 2021-10-12 DIAGNOSIS — L0390 Cellulitis, unspecified: Secondary | ICD-10-CM | POA: Diagnosis not present

## 2021-10-12 DIAGNOSIS — K219 Gastro-esophageal reflux disease without esophagitis: Secondary | ICD-10-CM | POA: Diagnosis not present

## 2021-10-12 DIAGNOSIS — R7303 Prediabetes: Secondary | ICD-10-CM | POA: Diagnosis not present

## 2021-10-12 DIAGNOSIS — F325 Major depressive disorder, single episode, in full remission: Secondary | ICD-10-CM | POA: Diagnosis not present

## 2021-10-12 DIAGNOSIS — E785 Hyperlipidemia, unspecified: Secondary | ICD-10-CM | POA: Diagnosis not present

## 2021-10-12 DIAGNOSIS — G4700 Insomnia, unspecified: Secondary | ICD-10-CM | POA: Diagnosis not present

## 2021-10-19 ENCOUNTER — Encounter: Payer: Self-pay | Admitting: Sports Medicine

## 2021-10-19 ENCOUNTER — Inpatient Hospital Stay (HOSPITAL_COMMUNITY)
Admission: EM | Admit: 2021-10-19 | Discharge: 2021-10-23 | DRG: 300 | Disposition: A | Discharge status: Home / Self Care | Payer: Medicare HMO | Attending: Internal Medicine | Admitting: Internal Medicine

## 2021-10-19 ENCOUNTER — Emergency Department (HOSPITAL_COMMUNITY): Payer: Medicare HMO

## 2021-10-19 ENCOUNTER — Encounter (HOSPITAL_COMMUNITY): Payer: Self-pay | Admitting: Internal Medicine

## 2021-10-19 ENCOUNTER — Ambulatory Visit (INDEPENDENT_AMBULATORY_CARE_PROVIDER_SITE_OTHER): Payer: Medicare HMO | Admitting: Sports Medicine

## 2021-10-19 DIAGNOSIS — M7989 Other specified soft tissue disorders: Secondary | ICD-10-CM | POA: Diagnosis not present

## 2021-10-19 DIAGNOSIS — Z853 Personal history of malignant neoplasm of breast: Secondary | ICD-10-CM

## 2021-10-19 DIAGNOSIS — Z8679 Personal history of other diseases of the circulatory system: Secondary | ICD-10-CM | POA: Diagnosis not present

## 2021-10-19 DIAGNOSIS — I890 Lymphedema, not elsewhere classified: Secondary | ICD-10-CM | POA: Diagnosis present

## 2021-10-19 DIAGNOSIS — F419 Anxiety disorder, unspecified: Secondary | ICD-10-CM | POA: Diagnosis not present

## 2021-10-19 DIAGNOSIS — I96 Gangrene, not elsewhere classified: Secondary | ICD-10-CM | POA: Diagnosis present

## 2021-10-19 DIAGNOSIS — I70261 Atherosclerosis of native arteries of extremities with gangrene, right leg: Secondary | ICD-10-CM | POA: Diagnosis not present

## 2021-10-19 DIAGNOSIS — Z7902 Long term (current) use of antithrombotics/antiplatelets: Secondary | ICD-10-CM

## 2021-10-19 DIAGNOSIS — L97519 Non-pressure chronic ulcer of other part of right foot with unspecified severity: Secondary | ICD-10-CM | POA: Diagnosis not present

## 2021-10-19 DIAGNOSIS — I739 Peripheral vascular disease, unspecified: Secondary | ICD-10-CM | POA: Diagnosis not present

## 2021-10-19 DIAGNOSIS — E559 Vitamin D deficiency, unspecified: Secondary | ICD-10-CM | POA: Diagnosis not present

## 2021-10-19 DIAGNOSIS — Z7982 Long term (current) use of aspirin: Secondary | ICD-10-CM

## 2021-10-19 DIAGNOSIS — G4700 Insomnia, unspecified: Secondary | ICD-10-CM

## 2021-10-19 DIAGNOSIS — E871 Hypo-osmolality and hyponatremia: Secondary | ICD-10-CM | POA: Diagnosis not present

## 2021-10-19 DIAGNOSIS — Z90710 Acquired absence of both cervix and uterus: Secondary | ICD-10-CM | POA: Diagnosis not present

## 2021-10-19 DIAGNOSIS — M47814 Spondylosis without myelopathy or radiculopathy, thoracic region: Secondary | ICD-10-CM | POA: Diagnosis not present

## 2021-10-19 DIAGNOSIS — J45909 Unspecified asthma, uncomplicated: Secondary | ICD-10-CM | POA: Diagnosis present

## 2021-10-19 DIAGNOSIS — E7800 Pure hypercholesterolemia, unspecified: Secondary | ICD-10-CM | POA: Diagnosis present

## 2021-10-19 DIAGNOSIS — L03115 Cellulitis of right lower limb: Secondary | ICD-10-CM | POA: Diagnosis present

## 2021-10-19 DIAGNOSIS — D509 Iron deficiency anemia, unspecified: Secondary | ICD-10-CM | POA: Diagnosis not present

## 2021-10-19 DIAGNOSIS — S90821A Blister (nonthermal), right foot, initial encounter: Secondary | ICD-10-CM

## 2021-10-19 DIAGNOSIS — Z79899 Other long term (current) drug therapy: Secondary | ICD-10-CM | POA: Diagnosis not present

## 2021-10-19 DIAGNOSIS — L97911 Non-pressure chronic ulcer of unspecified part of right lower leg limited to breakdown of skin: Secondary | ICD-10-CM

## 2021-10-19 DIAGNOSIS — L03031 Cellulitis of right toe: Secondary | ICD-10-CM | POA: Diagnosis present

## 2021-10-19 DIAGNOSIS — I10 Essential (primary) hypertension: Secondary | ICD-10-CM | POA: Diagnosis present

## 2021-10-19 DIAGNOSIS — L97921 Non-pressure chronic ulcer of unspecified part of left lower leg limited to breakdown of skin: Secondary | ICD-10-CM | POA: Diagnosis not present

## 2021-10-19 DIAGNOSIS — K449 Diaphragmatic hernia without obstruction or gangrene: Secondary | ICD-10-CM | POA: Diagnosis not present

## 2021-10-19 DIAGNOSIS — M810 Age-related osteoporosis without current pathological fracture: Secondary | ICD-10-CM | POA: Diagnosis present

## 2021-10-19 DIAGNOSIS — G609 Hereditary and idiopathic neuropathy, unspecified: Secondary | ICD-10-CM

## 2021-10-19 DIAGNOSIS — L97912 Non-pressure chronic ulcer of unspecified part of right lower leg with fat layer exposed: Secondary | ICD-10-CM | POA: Diagnosis not present

## 2021-10-19 DIAGNOSIS — L089 Local infection of the skin and subcutaneous tissue, unspecified: Secondary | ICD-10-CM

## 2021-10-19 DIAGNOSIS — K219 Gastro-esophageal reflux disease without esophagitis: Secondary | ICD-10-CM | POA: Diagnosis present

## 2021-10-19 DIAGNOSIS — E878 Other disorders of electrolyte and fluid balance, not elsewhere classified: Secondary | ICD-10-CM | POA: Diagnosis not present

## 2021-10-19 DIAGNOSIS — K5730 Diverticulosis of large intestine without perforation or abscess without bleeding: Secondary | ICD-10-CM | POA: Diagnosis not present

## 2021-10-19 DIAGNOSIS — I2510 Atherosclerotic heart disease of native coronary artery without angina pectoris: Secondary | ICD-10-CM | POA: Diagnosis not present

## 2021-10-19 DIAGNOSIS — L0390 Cellulitis, unspecified: Secondary | ICD-10-CM | POA: Diagnosis not present

## 2021-10-19 DIAGNOSIS — F32A Depression, unspecified: Secondary | ICD-10-CM | POA: Diagnosis not present

## 2021-10-19 DIAGNOSIS — S2242XA Multiple fractures of ribs, left side, initial encounter for closed fracture: Secondary | ICD-10-CM | POA: Diagnosis not present

## 2021-10-19 HISTORY — DX: Malignant neoplasm of unspecified site of unspecified female breast: C50.919

## 2021-10-19 LAB — CBC WITH DIFFERENTIAL/PLATELET
Abs Immature Granulocytes: 0.11 10*3/uL — ABNORMAL HIGH (ref 0.00–0.07)
Basophils Absolute: 0.1 10*3/uL (ref 0.0–0.1)
Basophils Relative: 1 %
Eosinophils Absolute: 0.3 10*3/uL (ref 0.0–0.5)
Eosinophils Relative: 3 %
HCT: 33.4 % — ABNORMAL LOW (ref 36.0–46.0)
Hemoglobin: 10.5 g/dL — ABNORMAL LOW (ref 12.0–15.0)
Immature Granulocytes: 1 %
Lymphocytes Relative: 13 %
Lymphs Abs: 1.5 10*3/uL (ref 0.7–4.0)
MCH: 27.3 pg (ref 26.0–34.0)
MCHC: 31.4 g/dL (ref 30.0–36.0)
MCV: 87 fL (ref 80.0–100.0)
Monocytes Absolute: 0.9 10*3/uL (ref 0.1–1.0)
Monocytes Relative: 8 %
Neutro Abs: 8.2 10*3/uL — ABNORMAL HIGH (ref 1.7–7.7)
Neutrophils Relative %: 74 %
Platelets: 513 10*3/uL — ABNORMAL HIGH (ref 150–400)
RBC: 3.84 MIL/uL — ABNORMAL LOW (ref 3.87–5.11)
RDW: 16.4 % — ABNORMAL HIGH (ref 11.5–15.5)
WBC: 11.1 10*3/uL — ABNORMAL HIGH (ref 4.0–10.5)
nRBC: 0 % (ref 0.0–0.2)

## 2021-10-19 LAB — BASIC METABOLIC PANEL
Anion gap: 11 (ref 5–15)
BUN: 17 mg/dL (ref 8–23)
CO2: 28 mmol/L (ref 22–32)
Calcium: 9.3 mg/dL (ref 8.9–10.3)
Chloride: 95 mmol/L — ABNORMAL LOW (ref 98–111)
Creatinine, Ser: 0.85 mg/dL (ref 0.44–1.00)
GFR, Estimated: >60 mL/min (ref >60)
Glucose, Bld: 119 mg/dL — ABNORMAL HIGH (ref 70–99)
Potassium: 3.3 mmol/L — ABNORMAL LOW (ref 3.5–5.1)
Sodium: 134 mmol/L — ABNORMAL LOW (ref 135–145)

## 2021-10-19 LAB — SEDIMENTATION RATE: Sed Rate: 35 mm/hr — ABNORMAL HIGH (ref 0–22)

## 2021-10-19 MED ORDER — SODIUM CHLORIDE 0.9 % IV BOLUS
1000.0000 mL | Freq: Once | INTRAVENOUS | Status: AC
  Administered 2021-10-19: 1000 mL via INTRAVENOUS

## 2021-10-19 MED ORDER — VANCOMYCIN HCL IN DEXTROSE 1-5 GM/200ML-% IV SOLN
1000.0000 mg | INTRAVENOUS | Status: DC
  Administered 2021-10-21 – 2021-10-22 (×3): 1000 mg via INTRAVENOUS
  Filled 2021-10-19 (×4): qty 200

## 2021-10-19 MED ORDER — ONDANSETRON HCL 4 MG/2ML IJ SOLN
4.0000 mg | Freq: Once | INTRAMUSCULAR | Status: AC
  Administered 2021-10-19: 4 mg via INTRAVENOUS
  Filled 2021-10-19: qty 2

## 2021-10-19 MED ORDER — KETOROLAC TROMETHAMINE 15 MG/ML IJ SOLN
15.0000 mg | Freq: Once | INTRAMUSCULAR | Status: AC
  Administered 2021-10-19: 15 mg via INTRAVENOUS
  Filled 2021-10-19: qty 1

## 2021-10-19 MED ORDER — VANCOMYCIN HCL 1500 MG/300ML IV SOLN
1500.0000 mg | Freq: Once | INTRAVENOUS | Status: AC
Start: 2021-10-19 — End: 2021-10-20
  Administered 2021-10-19: 1500 mg via INTRAVENOUS
  Filled 2021-10-19: qty 300

## 2021-10-19 NOTE — Consult Note (Signed)
? ?[facility] ? ?Patient name: [name] MRN: [id number] DOB: [birth year] Sex: female ? ? ?REQUESTING PROVIDER:  ? ?Dr. Bridgett Larsson  ? ? ?REASON FOR CONSULT:  ?  ?Right toe gangrene and cellulitis ? ?HISTORY OF PRESENT ILLNESS:  ? ?[name] is a 71 y.o. female, who presented to the emergency department with worsening right second and third toe gangrene and cellulitis.  The patient was initially seen in February for this by Dr. Unk Lightning.  At that time she had a 45-monthhistory of nonhealing wounds on her right second and third toes.  Noninvasive imaging showed biphasic waveforms with microvascular disease.  There was concern for blue toe syndrome. ? ?The patient has a history of breast cancer.  She has bilateral lower extremity lymphedema.  She underwent a embolic work-up including echocardiogram and CT angiogram of the chest abdomen pelvis with runoff.  There was no obvious embolic source, however she did have atherosclerotic disease.  On 08/25/2021 she underwent angiography with stenting of her superficial femoral artery. ? ?The patient is medically managed for hypertension.  She is a non-smoker.  She is on a statin for hypercholesterolemia.  She takes dual antiplatelet therapy with aspirin and Plavix. ? ?PAST MEDICAL HISTORY  ? ? ?Past Medical History:  ?Diagnosis Date  ? Arthritis   ? joints  ? Asthma   ? allergic  ? GERD (gastroesophageal reflux disease)   ? H/O hiatal hernia   ? Heart murmur   ? not MVP no treatment  ? Hyperlipidemia   ? Hypertension   ? Insomnia   ? Osteopenia   ? Osteoporosis   ? PONV (postoperative nausea and vomiting)   ? Rosacea   ? neck,cheeks  ? ? ? ?FAMILY HISTORY  ? ?No family history on file. ? ?SOCIAL HISTORY:  ? ?Social History  ? ?Socioeconomic History  ? Marital status: Widowed  ?  Spouse name: Not on file  ? Number of children: Not on file  ? Years of education: Not on file  ? Highest education level: Not on  file  ?Occupational History  ? Not on file  ?Tobacco Use  ? Smoking status: Never  ? Smokeless tobacco: Never  ?Substance and Sexual Activity  ? Alcohol use: No  ? Drug use: No  ? Sexual activity: Not on file  ?Other Topics Concern  ? Not on file  ?Social History Narrative  ? Not on file  ? ?Social Determinants of Health  ? ?Financial Resource Strain: Not on file  ?Food Insecurity: Not on file  ?Transportation Needs: Not on file  ?Physical Activity: Not on file  ?Stress: Not on file  ?Social Connections: Not on file  ?Intimate Partner Violence: Not on file  ? ? ?ALLERGIES:  ? ? ?Allergies  ?Allergen Reactions  ? Latex Itching  ? Levofloxacin   ?  Possible tendon rupture  ? Morphine And Related Nausea Only  ? ? ?CURRENT MEDICATIONS:  ? ? ?Current Facility-Administered Medications  ?Medication Dose Route Frequency Provider Last Rate Last Admin  ? triamcinolone acetonide (KENALOG) 10 MG/ML injection 10 mg  10 mg Other Once SLandis Martins DPM      ? [START ON 10/20/2021] vancomycin (VANCOCIN) IVPB 1000 mg/200 mL premix  1,000 mg Intravenous Q24H GWynona DoveA, DO      ? vancomycin (VANCOREADY) IVPB 1500 mg/300 mL  1,500 mg Intravenous Once GWynona DoveA, DO 150 mL/hr at 10/19/21 2253 1,500 mg at 10/19/21 2253  ? ?  Current Outpatient Medications  ?Medication Sig Dispense Refill  ? amitriptyline (ELAVIL) 75 MG tablet Take 75 mg by mouth at bedtime.    ? amoxicillin-clavulanate (AUGMENTIN) 875-125 MG tablet Take 1 tablet by mouth 2 (two) times daily. 28 tablet 0  ? aspirin EC 81 MG tablet Take 81 mg by mouth daily. Swallow whole.    ? atorvastatin (LIPITOR) 20 MG tablet Take 20 mg by mouth daily.    ? calcium carbonate (OS-CAL) 600 MG TABS Take 600 mg by mouth 2 (two) times daily with a meal. Vitamin D 400 mg    ? celecoxib (CELEBREX) 200 MG capsule Take 200 mg by mouth 2 (two) times daily.    ? Cholecalciferol (VITAMIN D3 PO) Take 5,000 Units by mouth daily.    ? clopidogrel (PLAVIX) 75 MG tablet Take 1 tablet (75 mg  total) by mouth daily. 30 tablet 11  ? Coenzyme Q10 (COQ10 PO) Take 200 mg by mouth daily.    ? doxycycline (VIBRA-TABS) 100 MG tablet Take 100 mg by mouth 2 (two) times daily.    ? fluticasone (FLONASE) 50 MCG/ACT nasal spray Place 1 spray into both nostrils daily as needed for allergies or rhinitis.    ? furosemide (LASIX) 40 MG tablet Take 40 mg by mouth 2 (two) times daily as needed for fluid or edema.    ? gabapentin (NEURONTIN) 600 MG tablet Take 600 mg by mouth 2 (two) times daily.    ? losartan-hydrochlorothiazide (HYZAAR) 100-25 MG tablet Take 1 tablet by mouth daily.    ? MAGNESIUM PO Take 400 mg by mouth at bedtime.    ? montelukast (SINGULAIR) 10 MG tablet Take 10 mg by mouth at bedtime.    ? Multiple Vitamin (MULTIVITAMIN) tablet Take 1 tablet by mouth daily.    ? mupirocin ointment (BACTROBAN) 2 % Apply topically 2 (two) times daily.    ? neomycin-bacitracin-polymyxin (NEOSPORIN) ointment Apply 1 application. topically daily as needed for wound care.    ? pantoprazole (PROTONIX) 40 MG tablet Take 40 mg by mouth daily.    ? raloxifene (EVISTA) 60 MG tablet Take 60 mg by mouth daily.    ? ? ?REVIEW OF SYSTEMS:  ? ?'[X]'$  denotes positive finding, '[ ]'$  denotes negative finding ?Cardiac  Comments:  ?Chest pain or chest pressure:    ?Shortness of breath upon exertion:    ?Short of breath when lying flat:    ?Irregular heart rhythm:    ?    ?Vascular    ?Pain in calf, thigh, or hip brought on by ambulation:    ?Pain in feet at night that wakes you up from your sleep:     ?Blood clot in your veins:    ?Leg swelling:     ?    ?Pulmonary    ?Oxygen at home:    ?Productive cough:     ?Wheezing:     ?    ?Neurologic    ?Sudden weakness in arms or legs:     ?Sudden numbness in arms or legs:     ?Sudden onset of difficulty speaking or slurred speech:    ?Temporary loss of vision in one eye:     ?Problems with dizziness:     ?    ?Gastrointestinal    ?Blood in stool:     ? ?Vomited blood:     ?    ?Genitourinary     ?Burning when urinating:     ?Blood in urine:    ?    ?  Psychiatric    ?Major depression:     ?    ?Hematologic    ?Bleeding problems:    ?Problems with blood clotting too easily:    ?    ?Skin    ?Rashes or ulcers: x   ?    ?Constitutional    ?Fever or chills:    ? ?PHYSICAL EXAM:  ? ?Vitals:  ? 10/19/21 1204 10/19/21 1510 10/19/21 1949 10/19/21 2246  ?BP: 120/72  138/83 138/70  ?Pulse: 93  90 84  ?Resp: '20  15 16  '$ ?Temp: 99.1 ?F (37.3 ?C)  98.8 ?F (37.1 ?C)   ?TempSrc: Oral  Oral   ?SpO2: 96%  95% 96%  ?Weight:  68.9 kg    ?Height:  '5\' 3"'$  (1.6 m)    ? ? ?GENERAL: The patient is a well-nourished female, in no acute distress. The vital signs are documented above. ?CARDIAC: There is a regular rate and rhythm.  ?VASCULAR: non-palpable right pedal pulses ?PULMONARY: Nonlabored respirations ?ABDOMEN: Soft and non-tender with normal pitched bowel sounds.  ?MUSCULOSKELETAL: There are no major deformities or cyanosis. ?NEUROLOGIC: No focal weakness or paresthesias are detected. ?SKIN: See photo below ?PSYCHIATRIC: The patient has a normal affect. ? ? ? ? ?STUDIES:  ? ?X-rays were negative for osteomyelitis. ? ?ASSESSMENT and PLAN  ? ?Right second and third toe gangrene with right leg cellulitis: The patient is to be admitted for IV antibiotics.  She will likely require digital amputation.  Before proceeding, she needs to undergo noninvasive imaging to ensure that her recent vascular intervention remains patent.  I will order this for the morning.  Further plans will be based on the results of the studies.  Do not anticipate toe amputation today.  She can eat from my perspective. ? ? ?Annamarie Major, IV, MD, FACS ?[facility] ?Tel [phone] ?Pager [phone]  ?

## 2021-10-19 NOTE — Subjective & Objective (Signed)
CC: right 3rd toe gangrene ?HPI: ?71 year old white female history of peripheral vascular disease status post right femoral stent in March 2023 by Dr. Unk Lightning, history of idiopathic neuropathy, vitamin D deficiency, insomnia, depression who presents to the ER today from the podiatry office.  Patient was seen in follow-up.  She has had toe ulcerations on her right foot for last several weeks now.  She was treated as an outpatient by her PCP with amoxicillin.  She is followed up by podiatry.  She was noted to have dry gangrene of her third right toe.  Patient is also had erythema and pain in the foot and up the leg.  Patient sent to the ER for evaluation. ? ?On arrival temp 99.1 heart rate 93 blood pressure 120/72. ? ?White count 11.1 hemoglobin of 10.5, platelets 513 ?Sodium 134, potassium 3.3, BUN of 17, creatinine 0.85. ? ?Sed rate 35. ? ?[facility] hospitalist contacted for admission. ? ?

## 2021-10-19 NOTE — Assessment & Plan Note (Signed)
Continue elavil  °

## 2021-10-19 NOTE — Assessment & Plan Note (Signed)
Continue vitamin d

## 2021-10-19 NOTE — Progress Notes (Signed)
Subjective: ?[name] is a 71 y.o. female patient who presents to office for follow-up evaluation of right third toe gangrene and states that things have gotten worse and redness on the leg states her PCP Dr. Truman Hayward gave her an injection in the hip, bactroban, and PCP gave her amoxicillin. Patient states once episode of nausea. States that there is a warm feeling. No other issues noted.  ? ?Patient Active Problem List  ? Diagnosis Date Noted  ? Gastric mass 05/24/2012  ? ? ?Current Outpatient Medications on File Prior to Visit  ?Medication Sig Dispense Refill  ? amitriptyline (ELAVIL) 75 MG tablet Take 75 mg by mouth at bedtime.    ? amoxicillin-clavulanate (AUGMENTIN) 875-125 MG tablet Take 1 tablet by mouth 2 (two) times daily. 28 tablet 0  ? aspirin EC 81 MG tablet Take 81 mg by mouth daily. Swallow whole.    ? atorvastatin (LIPITOR) 20 MG tablet Take 20 mg by mouth daily.    ? calcium carbonate (OS-CAL) 600 MG TABS Take 600 mg by mouth 2 (two) times daily with a meal. Vitamin D 400 mg    ? celecoxib (CELEBREX) 200 MG capsule Take 200 mg by mouth 2 (two) times daily.    ? Cholecalciferol (VITAMIN D3 PO) Take 5,000 Units by mouth daily.    ? clopidogrel (PLAVIX) 75 MG tablet Take 1 tablet (75 mg total) by mouth daily. 30 tablet 11  ? Coenzyme Q10 (COQ10 PO) Take 200 mg by mouth daily.    ? fluticasone (FLONASE) 50 MCG/ACT nasal spray Place 1 spray into both nostrils daily as needed for allergies or rhinitis.    ? furosemide (LASIX) 40 MG tablet Take 40 mg by mouth 2 (two) times daily as needed for fluid or edema.    ? gabapentin (NEURONTIN) 600 MG tablet Take 600 mg by mouth 2 (two) times daily.    ? losartan-hydrochlorothiazide (HYZAAR) 100-25 MG tablet Take 1 tablet by mouth daily.    ? MAGNESIUM PO Take 400 mg by mouth at bedtime.    ? montelukast (SINGULAIR) 10 MG tablet Take 10 mg by mouth at bedtime.    ? Multiple Vitamin (MULTIVITAMIN) tablet Take 1 tablet by mouth daily.    ?  neomycin-bacitracin-polymyxin (NEOSPORIN) ointment Apply 1 application. topically daily as needed for wound care.    ? pantoprazole (PROTONIX) 40 MG tablet Take 40 mg by mouth daily.    ? raloxifene (EVISTA) 60 MG tablet Take 60 mg by mouth daily.    ? ?Current Facility-Administered Medications on File Prior to Visit  ?Medication Dose Route Frequency Provider Last Rate Last Admin  ? triamcinolone acetonide (KENALOG) 10 MG/ML injection 10 mg  10 mg Other Once Landis Martins, DPM      ? ? ?Allergies  ?Allergen Reactions  ? Latex Itching  ? Levofloxacin   ?  Possible tendon rupture  ? Morphine And Related Nausea Only  ? ? ?Objective:  ?General: Alert and oriented x3 in no acute distress ? ?Dermatology: Nails are short and thickened.  There is focal dry necrosis at the distal tuft of the right third toe that appears to be the same as previous encounter. New changes of purelent blister to top of toe. There is siginificant edema and cellulitis to the right leg, partial thickness ulcer multiple to lateral foot and lateral leg and anterior leg as pictured. There is a small abrasion noted to the distal tuft of the right second toe.   ? ? ? ? ? ? ? ? ? ? ?  Vascular: Dorsalis Pedis and Posterior Tibial pedal pulses are faint with blue discoloration to the plantar surfaces of both feet with pitting edema as noted above. ? ?Neurology: Gross sensation intact via light touch bilateral. ? ?Musculoskeletal: Minimal pain to palpation to the right foot currently but per patient pain worsens at night with burning on Gabapentin.  Pes planus foot type. ? ? ?Assessment and Plan: ?Problem List Items Addressed This Visit   ?None ?Visit Diagnoses   ? ? Gangrene of toe of right foot ([place])    -  Primary  ? PVD (peripheral vascular disease) ([place])      ? Cellulitis of right anterior lower leg      ? Infected blister of right foot, initial encounter      ? Non-healing ulcer of lower leg, right, limited to breakdown of skin ([place])      ? ?  ?   ? ?-Complete examination performed ?-Photograph taken this visit  ?-Advised patient to go to ER for labs, IV antibiotics and updated foot and leg xray on right, if negative patient will need MRI. Recommend admission and re-consult to vascular; patient has failed outpatient treatment; PCP gave amoxicillin that has not helped. ?-Applied antibiotic ointment to leg and betadine to toe on right covered with dry dressing ?-Continue with gabapentin and tramadol for breakthrough pain ?-Return to office after ER. Dr. Blenda Mounts, on call podiatrist notified.  ? ?Landis Martins, DPM ? ?

## 2021-10-19 NOTE — ED Triage Notes (Signed)
Pt. Stated I had a stent in my rt femur in the middle of March. Ive had the wounds and blisters on my rt. Foot and leg. I went to foot Center and the Dr. Durene Cal me here and said I needed IV antibiotics for my wounds. ?

## 2021-10-19 NOTE — Assessment & Plan Note (Addendum)
S/p right femoral stent in 08/2021. Continue asa and plavix.  Patient states she has been compliant with taking aspirin and Plavix.  Vascular surgery consulted. ?

## 2021-10-19 NOTE — ED Provider Notes (Signed)
?[facility] ?Provider Note ? ? ?CSN: [id number] ?Arrival date & time: 10/19/21  1156 ? ?  ? ?History ? ?Chief Complaint  ?Patient presents with  ? Wound Check  ? Wound Infection  ? ? ?[name] is a 71 y.o. female. ? ? Patient as above with significant medical history as below, including hyperlipidemia hypertension, who presents to the ED with complaint of right lower extremity wound ? ?Dr Virl Cagey vascular surgery, she had lower extremity angiography in March. Stenting to RLE 3/22 with Dr Virl Cagey to SFA on right.  ? ?Follows with podiatry Dr Cannon Kettle.  She was seen in the office earlier today.  Patient with multiple rounds of oral antibiotics without improvement to her right lower extremity cellulitis.  She also has gangrene to her second and third digit on the right foot.  Patient reports increased redness and pain to her right lower extremity over the past few days.  New wounds on dorsal surface of right third digit in last  24 hours.  ? ?She denies fevers, chills, chest pain, dyspnea, nausea or vomiting.  Does have worsening pain to her right lower extremity with ambulation.  She feels as though the redness has progressed to just below her knee on the right over the past 24 to 48 hours. ? ? ?Past Medical History: ?No date: Arthritis ?    Comment:  joints ?No date: Asthma ?    Comment:  allergic ?No date: GERD (gastroesophageal reflux disease) ?No date: H/O hiatal hernia ?No date: Heart murmur ?    Comment:  not MVP no treatment ?No date: Hyperlipidemia ?No date: Hypertension ?No date: Insomnia ?No date: Osteopenia ?No date: Osteoporosis ?No date: PONV (postoperative nausea and vomiting) ?No date: Rosacea ?    Comment:  neck,cheeks ? ?Past Surgical History: ?08/25/2021: ABDOMINAL AORTOGRAM W/LOWER EXTREMITY; Right ?    Comment:  Procedure: ABDOMINAL AORTOGRAM W/LOWER EXTREMITY;   ?             Surgeon: Broadus John, MD;  Location: [facility] INVASIVE CV  ?             LAB;   Service: Cardiovascular;  Laterality: Right; ?No date: ABDOMINAL HYSTERECTOMY ?No date: Bladder Tack ?No date: Blader Tack ?04-1999: BREAST LUMPECTOMY ?05-1999: BREAST LUMPECTOMY ?07-2010: COLON SURGERY ?No date: COLONOSCOPY W/ POLYPECTOMY ?No date: EDG  with polypectomy (gastric polyps) ?05/24/2012: EUS ?    Comment:  Procedure: UPPER ENDOSCOPIC ULTRASOUND (EUS) LINEAR;   ?             Surgeon: Milus Banister, MD;  Location: [facility] ENDOSCOPY;   ?             Service: Endoscopy;  Laterality: N/A; ?2002: Lumpectomy 07-2000 ?2001: Port a cath Insertion ?05-2000: PORT-A-CATH REMOVAL ?No date: TONSILLECTOMY  ? ? ?The history is provided by the patient. No language interpreter was used.  ?Wound Check ?Pertinent negatives include no chest pain, no abdominal pain, no headaches and no shortness of breath.  ? ?  ? ?Home Medications ?Prior to Admission medications   ?Medication Sig Start Date End Date Taking? Authorizing Provider  ?amitriptyline (ELAVIL) 75 MG tablet Take 75 mg by mouth at bedtime. 06/23/21   [provider]  ?amoxicillin-clavulanate (AUGMENTIN) 875-125 MG tablet Take 1 tablet by mouth 2 (two) times daily. 09/14/21   Landis Martins, DPM  ?aspirin EC 81 MG tablet Take 81 mg by mouth daily. Swallow whole.    [provider]  ?atorvastatin (  LIPITOR) 20 MG tablet Take 20 mg by mouth daily. 08/06/14   [provider]  ?calcium carbonate (OS-CAL) 600 MG TABS Take 600 mg by mouth 2 (two) times daily with a meal. Vitamin D 400 mg    [provider]  ?celecoxib (CELEBREX) 200 MG capsule Take 200 mg by mouth 2 (two) times daily. 04/12/21   [provider]  ?Cholecalciferol (VITAMIN D3 PO) Take 5,000 Units by mouth daily.    [provider]  ?clopidogrel (PLAVIX) 75 MG tablet Take 1 tablet (75 mg total) by mouth daily. 08/25/21 08/25/22  Broadus John, MD  ?Coenzyme Q10 (COQ10 PO) Take 200 mg by mouth daily.    [provider]  ?doxycycline (VIBRA-TABS) 100 MG  tablet Take 100 mg by mouth 2 (two) times daily. 10/04/21   [provider]  ?fluticasone (FLONASE) 50 MCG/ACT nasal spray Place 1 spray into both nostrils daily as needed for allergies or rhinitis.    [provider]  ?furosemide (LASIX) 40 MG tablet Take 40 mg by mouth 2 (two) times daily as needed for fluid or edema. 06/24/21   [provider]  ?gabapentin (NEURONTIN) 600 MG tablet Take 600 mg by mouth 2 (two) times daily. 01/27/21   [provider]  ?losartan-hydrochlorothiazide (HYZAAR) 100-25 MG tablet Take 1 tablet by mouth daily. 06/23/21   [provider]  ?MAGNESIUM PO Take 400 mg by mouth at bedtime.    [provider]  ?montelukast (SINGULAIR) 10 MG tablet Take 10 mg by mouth at bedtime. 04/10/21   [provider]  ?Multiple Vitamin (MULTIVITAMIN) tablet Take 1 tablet by mouth daily.    [provider]  ?mupirocin ointment (BACTROBAN) 2 % Apply topically 2 (two) times daily. 10/12/21   [provider]  ?neomycin-bacitracin-polymyxin (NEOSPORIN) ointment Apply 1 application. topically daily as needed for wound care.    [provider]  ?pantoprazole (PROTONIX) 40 MG tablet Take 40 mg by mouth daily. 01/08/20   [provider]  ?raloxifene (EVISTA) 60 MG tablet Take 60 mg by mouth daily.    [provider]  ?   ? ?Allergies    ?Latex, Levofloxacin, and Morphine and related   ? ?Review of Systems   ?Review of Systems  ?Constitutional:  Negative for chills and fever.  ?HENT:  Negative for facial swelling and trouble swallowing.   ?Eyes:  Negative for photophobia and visual disturbance.  ?Respiratory:  Negative for cough and shortness of breath.   ?Cardiovascular:  Negative for chest pain and palpitations.  ?Gastrointestinal:  Negative for abdominal pain, nausea and vomiting.  ?Endocrine: Negative for polydipsia and polyuria.  ?Genitourinary:  Negative for difficulty urinating and hematuria.  ?Musculoskeletal:   Negative for gait problem and joint swelling.  ?Skin:  Positive for color change and wound. Negative for pallor and rash.  ?Neurological:  Negative for syncope and headaches.  ?Psychiatric/Behavioral:  Negative for agitation and confusion.   ? ?Physical Exam ?Updated Vital Signs ?BP 138/83 (BP Location: Left Arm)   Pulse 90   Temp 98.8 ?F (37.1 ?C) (Oral)   Resp 15   Ht '5\' 3"'$  (1.6 m)   Wt 68.9 kg   SpO2 95%   BMI 26.93 kg/m?  ?Physical Exam ?Vitals and nursing note reviewed.  ?Constitutional:   ?   General: She is not in acute distress. ?   Appearance: Normal appearance. She is not ill-appearing or diaphoretic.  ?HENT:  ?   Head: Normocephalic and atraumatic.  ?  Right Ear: External ear normal.  ?   Left Ear: External ear normal.  ?   Nose: Nose normal.  ?   Mouth/Throat:  ?   Mouth: Mucous membranes are moist.  ?Eyes:  ?   General: No scleral icterus.    ?   Right eye: No discharge.     ?   Left eye: No discharge.  ?Cardiovascular:  ?   Rate and Rhythm: Normal rate and regular rhythm.  ?   Pulses: Normal pulses.  ?   Heart sounds: Normal heart sounds.  ?Pulmonary:  ?   Effort: Pulmonary effort is normal. No respiratory distress.  ?   Breath sounds: Normal breath sounds.  ?Abdominal:  ?   General: Abdomen is flat.  ?   Tenderness: There is no abdominal tenderness.  ?Musculoskeletal:     ?   General: Normal range of motion.  ?   Cervical back: Normal range of motion.  ?   Right lower leg: No edema.  ?   Left lower leg: No edema.  ?Skin: ?   General: Skin is warm and dry.  ?   Capillary Refill: Capillary refill takes less than 2 seconds.  ? ?    ?   Comments: Cellulitic changes to right lower extremity distal of right patella.  Multiple superficial wounds, dry gangrene is noted to second third digit on the right.  See photos.  ?Neurological:  ?   Mental Status: She is alert.  ?Psychiatric:     ?   Mood and Affect: Mood normal.     ?   Behavior: Behavior normal.  ? ? ? ? ? ? ? ? ? ? ? ?ED Results / Procedures /  Treatments   ?Labs ?(all labs ordered are listed, but only abnormal results are displayed) ?Labs Reviewed  ?BASIC METABOLIC PANEL - Abnormal; Notable for the following components:  ?    Result Value  ? Sodium 134 (

## 2021-10-19 NOTE — ED Provider Triage Note (Signed)
Emergency Medicine Provider Triage Evaluation Note ? ?[name] , a 71 y.o. female  was evaluated in triage.  Pt complains of worsening wound of right foot.  Sent from [facility].  Denies fevers or chills.  Still has sensation of foot.  Hx of right femur stent placed in 08/2021. ? ?Review of Systems  ?Positive: As above ?Negative: As above ? ?Physical Exam  ?BP 120/72 (BP Location: Left Arm)   Pulse 93   Temp 99.1 ?F (37.3 ?C) (Oral)   Resp 20   SpO2 96%  ?Gen:   Awake, no distress   ?Resp:  Normal effort  ?MSK:   Moves extremities without difficulty  ?Other:  Swelling, erythema of the right foot ankle and lower leg.  Black discoloration of several of the toes on the left foot.  DP and PT pulses identified via Doppler.  Sensation and ROM intact of all toes of the lower right foot. ? ?Medical Decision Making  ?Medically screening exam initiated at 3:46 PM.  Appropriate orders placed.  Maiko Salais was informed that the remainder of the evaluation will be completed by another provider, this initial triage assessment does not replace that evaluation, and the importance of remaining in the ED until their evaluation is complete. ? ?Labs ordered. ?  ?Prince Rome, PA-C ?62/44/69 1555 ? ?

## 2021-10-19 NOTE — Assessment & Plan Note (Signed)
Continue neurontin 

## 2021-10-19 NOTE — Assessment & Plan Note (Signed)
Stable

## 2021-10-19 NOTE — Progress Notes (Signed)
Pharmacy Antibiotic Note ? ?[name] is a 71 y.o. female admitted on 10/19/2021 with cellulitis.  Pharmacy has been consulted for Vancomycin dosing. ? ?Plan: ?Vancomycin 1500 mg x 1, followed by 1000 mg IV q24h (eAUC 437.5, SCr 0.85, goal AUC 400-550) ?Follow-up clinical status, renal function ?Follow-up cultures, LOT, de-escalate as able  ?Obtain Vancomycin levels as appropriate  ? ? ?Height: '5\' 3"'$  (160 cm) ?Weight: 68.9 kg (152 lb) ?IBW/kg (Calculated) : 52.4 ? ?Temp (24hrs), Avg:99 ?F (37.2 ?C), Min:98.8 ?F (37.1 ?C), Max:99.1 ?F (37.3 ?C) ? ?Recent Labs  ?Lab 10/19/21 ?1342  ?WBC 11.1*  ?CREATININE 0.85  ?  ?Estimated Creatinine Clearance: 56.5 mL/min (by C-G formula based on SCr of 0.85 mg/dL).   ? ?Allergies  ?Allergen Reactions  ? Latex Itching  ? Levofloxacin   ?  Possible tendon rupture  ? Morphine And Related Nausea Only  ? ? ?Antimicrobials this admission: ?Vancomycin 5/16 >>  ? ?Microbiology results: ?5/16 BCx: pending ?5/16 Wound Cx: pending  ? ? ?Thank you for allowing pharmacy to be a part of this patient?s care. ? ?Vance Peper, PharmD ?PGY1 Pharmacy Resident ?10/19/2021 10:28 PM  ? ?Please check AMION for all [place] phone numbers ?After 10:00 PM, call [place] [phone] ? ? ?

## 2021-10-19 NOTE — Assessment & Plan Note (Addendum)
Admit to med/surg bed. IV Vanco. Vascular surgery consulted. Podiatry requested MRI of foot.  Will defer any additional imaging to vascular surgery. Discussed with pt. She would be agreeable to amputation of toe if needed. Prn oxycodone for pain. Podiatry reports pt's PCP treated right toe with po amoxil. Check A1C in case amputation is consider.   ? ? ? ? ? ? ? ? ? ? ? ? ? ? ? ? ? ? ?

## 2021-10-20 ENCOUNTER — Inpatient Hospital Stay (HOSPITAL_COMMUNITY): Payer: Medicare HMO

## 2021-10-20 DIAGNOSIS — I739 Peripheral vascular disease, unspecified: Secondary | ICD-10-CM

## 2021-10-20 DIAGNOSIS — L0390 Cellulitis, unspecified: Secondary | ICD-10-CM

## 2021-10-20 DIAGNOSIS — G4700 Insomnia, unspecified: Secondary | ICD-10-CM

## 2021-10-20 DIAGNOSIS — F32A Depression, unspecified: Secondary | ICD-10-CM

## 2021-10-20 DIAGNOSIS — I96 Gangrene, not elsewhere classified: Secondary | ICD-10-CM

## 2021-10-20 DIAGNOSIS — E559 Vitamin D deficiency, unspecified: Secondary | ICD-10-CM

## 2021-10-20 DIAGNOSIS — G609 Hereditary and idiopathic neuropathy, unspecified: Secondary | ICD-10-CM | POA: Diagnosis not present

## 2021-10-20 LAB — COMPREHENSIVE METABOLIC PANEL
ALT: 16 U/L (ref 0–44)
AST: 18 U/L (ref 15–41)
Albumin: 2.7 g/dL — ABNORMAL LOW (ref 3.5–5.0)
Alkaline Phosphatase: 92 U/L (ref 38–126)
Anion gap: 7 (ref 5–15)
BUN: 16 mg/dL (ref 8–23)
CO2: 26 mmol/L (ref 22–32)
Calcium: 8.9 mg/dL (ref 8.9–10.3)
Chloride: 101 mmol/L (ref 98–111)
Creatinine, Ser: 0.75 mg/dL (ref 0.44–1.00)
GFR, Estimated: >60 mL/min (ref >60)
Glucose, Bld: 130 mg/dL — ABNORMAL HIGH (ref 70–99)
Potassium: 4.1 mmol/L (ref 3.5–5.1)
Sodium: 134 mmol/L — ABNORMAL LOW (ref 135–145)
Total Bilirubin: 0.8 mg/dL (ref 0.3–1.2)
Total Protein: 5.5 g/dL — ABNORMAL LOW (ref 6.5–8.1)

## 2021-10-20 LAB — CBC WITH DIFFERENTIAL/PLATELET
Abs Immature Granulocytes: 0.06 10*3/uL (ref 0.00–0.07)
Basophils Absolute: 0.1 10*3/uL (ref 0.0–0.1)
Basophils Relative: 1 %
Eosinophils Absolute: 0.3 10*3/uL (ref 0.0–0.5)
Eosinophils Relative: 3 %
HCT: 27.4 % — ABNORMAL LOW (ref 36.0–46.0)
Hemoglobin: 8.9 g/dL — ABNORMAL LOW (ref 12.0–15.0)
Immature Granulocytes: 1 %
Lymphocytes Relative: 15 %
Lymphs Abs: 1.5 10*3/uL (ref 0.7–4.0)
MCH: 28.1 pg (ref 26.0–34.0)
MCHC: 32.5 g/dL (ref 30.0–36.0)
MCV: 86.4 fL (ref 80.0–100.0)
Monocytes Absolute: 0.8 10*3/uL (ref 0.1–1.0)
Monocytes Relative: 8 %
Neutro Abs: 7.7 10*3/uL (ref 1.7–7.7)
Neutrophils Relative %: 72 %
Platelets: 423 10*3/uL — ABNORMAL HIGH (ref 150–400)
RBC: 3.17 MIL/uL — ABNORMAL LOW (ref 3.87–5.11)
RDW: 16.2 % — ABNORMAL HIGH (ref 11.5–15.5)
WBC: 10.4 10*3/uL (ref 4.0–10.5)
nRBC: 0 % (ref 0.0–0.2)

## 2021-10-20 LAB — HEMOGLOBIN A1C
Hgb A1c MFr Bld: 6.7 % — ABNORMAL HIGH (ref 4.8–5.6)
Mean Plasma Glucose: 145.59 mg/dL

## 2021-10-20 LAB — LIPID PANEL
Cholesterol: 153 mg/dL (ref 0–200)
HDL: 56 mg/dL (ref >40)
LDL Cholesterol: 85 mg/dL (ref 0–99)
Total CHOL/HDL Ratio: 2.7 RATIO
Triglycerides: 59 mg/dL (ref <150)
VLDL: 12 mg/dL (ref 0–40)

## 2021-10-20 LAB — SURGICAL PCR SCREEN
MRSA, PCR: NEGATIVE
Staphylococcus aureus: NEGATIVE

## 2021-10-20 LAB — MAGNESIUM: Magnesium: 1.7 mg/dL (ref 1.7–2.4)

## 2021-10-20 MED ORDER — ACETAMINOPHEN 650 MG RE SUPP: 650.0000 mg | Freq: Four times a day (QID) | RECTAL | Status: DC | PRN

## 2021-10-20 MED ORDER — POTASSIUM CHLORIDE CRYS ER 20 MEQ PO TBCR
40.0000 meq | EXTENDED_RELEASE_TABLET | Freq: Once | ORAL | Status: AC
  Administered 2021-10-20: 40 meq via ORAL
  Filled 2021-10-20: qty 2

## 2021-10-20 MED ORDER — GABAPENTIN 600 MG PO TABS
600.0000 mg | ORAL_TABLET | Freq: Two times a day (BID) | ORAL | Status: DC
  Administered 2021-10-20 – 2021-10-23 (×8): 600 mg via ORAL
  Filled 2021-10-20 (×9): qty 1

## 2021-10-20 MED ORDER — LOSARTAN POTASSIUM-HCTZ 100-25 MG PO TABS: 1.0000 | ORAL_TABLET | Freq: Every day | ORAL | Status: DC

## 2021-10-20 MED ORDER — ACETAMINOPHEN 325 MG PO TABS
650.0000 mg | ORAL_TABLET | Freq: Four times a day (QID) | ORAL | Status: DC | PRN
  Administered 2021-10-20 – 2021-10-22 (×3): 650 mg via ORAL
  Filled 2021-10-20 (×3): qty 2

## 2021-10-20 MED ORDER — AMITRIPTYLINE HCL 50 MG PO TABS
75.0000 mg | ORAL_TABLET | Freq: Every day | ORAL | Status: DC
  Administered 2021-10-20 – 2021-10-22 (×4): 75 mg via ORAL
  Filled 2021-10-20 (×3): qty 1
  Filled 2021-10-20: qty 3
  Filled 2021-10-20: qty 1
  Filled 2021-10-20: qty 3

## 2021-10-20 MED ORDER — ONDANSETRON HCL 4 MG/2ML IJ SOLN: 4.0000 mg | Freq: Four times a day (QID) | INTRAMUSCULAR | Status: DC | PRN

## 2021-10-20 MED ORDER — LOSARTAN POTASSIUM 50 MG PO TABS
100.0000 mg | ORAL_TABLET | Freq: Every day | ORAL | Status: DC
  Administered 2021-10-20: 100 mg via ORAL
  Filled 2021-10-20: qty 2

## 2021-10-20 MED ORDER — ONDANSETRON HCL 4 MG PO TABS: 4.0000 mg | ORAL_TABLET | Freq: Four times a day (QID) | ORAL | Status: DC | PRN

## 2021-10-20 MED ORDER — ATORVASTATIN CALCIUM 10 MG PO TABS
20.0000 mg | ORAL_TABLET | Freq: Every day | ORAL | Status: DC
  Administered 2021-10-20 – 2021-10-23 (×4): 20 mg via ORAL
  Filled 2021-10-20 (×4): qty 2

## 2021-10-20 MED ORDER — OXYCODONE HCL 5 MG PO TABS
5.0000 mg | ORAL_TABLET | ORAL | Status: DC | PRN
  Administered 2021-10-21 – 2021-10-23 (×6): 5 mg via ORAL
  Filled 2021-10-20 (×6): qty 1

## 2021-10-20 MED ORDER — AMITRIPTYLINE HCL 75 MG PO TABS
75.0000 mg | ORAL_TABLET | Freq: Every day | ORAL | Status: DC
  Filled 2021-10-20 (×2): qty 1

## 2021-10-20 MED ORDER — HEPARIN SODIUM (PORCINE) 5000 UNIT/ML IJ SOLN
5000.0000 [IU] | Freq: Three times a day (TID) | INTRAMUSCULAR | Status: DC
  Administered 2021-10-20 – 2021-10-23 (×10): 5000 [IU] via SUBCUTANEOUS
  Filled 2021-10-20 (×10): qty 1

## 2021-10-20 MED ORDER — CLOPIDOGREL BISULFATE 75 MG PO TABS
75.0000 mg | ORAL_TABLET | Freq: Every day | ORAL | Status: DC
  Administered 2021-10-20 – 2021-10-23 (×4): 75 mg via ORAL
  Filled 2021-10-20 (×4): qty 1

## 2021-10-20 MED ORDER — ASPIRIN 81 MG PO TBEC
81.0000 mg | DELAYED_RELEASE_TABLET | Freq: Every day | ORAL | Status: DC
  Administered 2021-10-20: 81 mg via ORAL
  Filled 2021-10-20: qty 1

## 2021-10-20 MED ORDER — HYDROCHLOROTHIAZIDE 25 MG PO TABS
25.0000 mg | ORAL_TABLET | Freq: Every day | ORAL | Status: DC
  Administered 2021-10-20: 25 mg via ORAL
  Filled 2021-10-20: qty 1

## 2021-10-20 NOTE — Progress Notes (Signed)
ABI and right lower extremity arterial duplex completed. ?Refer to "CV Proc" under chart review to view preliminary results. ? ?10/20/2021 9:44 AM ?Kelby Aline., MHA, RVT, RDCS, RDMS   ?

## 2021-10-20 NOTE — Progress Notes (Signed)
?PROGRESS NOTE ? ?[name]  ?DOB: 05-29-1951  ?PCP: [name], MD ?WUJ:[id number]  ?DOA: 10/19/2021 ? LOS: 1 day  ?Hospital Day: 2 ? ?Brief narrative: ?[name] is a 71 y.o. female with PMH significant for PAD s/p right femoral stent in March 2023 by Dr. Unk Lightning, HTN, HLD, arthritis, breast cancer, GERD, gastric mass, history of idiopathic neuropathy, vitamin D deficiency, insomnia, depression. ?She had a toe ulceration on the right foot for last several weeks and was treated by her PCP for amoxicillin.   ?5/16, patient was seen at podiatrist office for follow-up.  She was noted to have a dry gangrene of her third right toe.  She also had evidence of cellulitis up to the leg and hence sent to the ED for evaluation. ?In the ED, she had a temperature of 99.1, WBC count of 11.1 ?X-ray of the foot did not show any evidence of acute bony abnormality but showed soft tissue swelling of the third toe and dorsum of the foot ?Admitted to hospitalist service.  Patient was started on IV vancomycin. ?Podiatry and vascular surgery consultation was called. ?5/17, MRI of the foot showed soft tissue ulceration at the tip of the third toe.  No evidence of osteomyelitis or abscess. ?  ? ?Subjective: ?Patient was seen and examined this afternoon.  Pleasant elderly Caucasian female.  Lying down in bed.  Not in distress.  Daughter at bedside. ?Chart reviewed ?No fever, hemodynamically stable ? ?Principal Problem: ?  Gangrene of toe of right foot ([place]) ?Active Problems: ?  PAD (peripheral artery disease) ([place]) ?  Idiopathic neuropathy ?  Vitamin D deficiency ?  Insomnia ?  Depression ?  ? ?Assessment and Plan: ?Gangrene of toe of right foot ?-Patient had ulceration of the right toes did not respond to outpatient antibiotic treatment.   ?-Clinically with cellulitis and gangrene of the toes.  X-ray and MRI did not show any evidence of osteomyelitis.   ?-Podiatry and vascular surgery consulted.   ?-Currently on IV  vancomycin.   ?-Continue pain control with oxycodone ?  ?PAD (peripheral artery disease)  ?Hyperlipidemia ?-s/p prior right femoral stent in 08/2021.  ?-Continue asa, plavix and statin ?-Vascular surgery consulted ? ?Essential hypertension ?-Continue losartan, HCTZ ?  ?Depression ?-Stable.  ?  ?Insomnia ?-Continue elavil. ?  ?Vitamin D deficiency ?-Continue vitamin D. ?  ?Idiopathic neuropathy ?-Continue neurontin. ? ?Goals of care ?  Code Status: Full Code  ? ? ?Mobility: May need PT evaluation after procedure ? ?Skin assessment:  ?  ? ?Nutritional status:  ?Body mass index is 26.93 kg/m?.  ?  ?  ? ? ? ? ?Diet:  ?Diet Order   ? ?       ?  Diet Heart Room service appropriate? Yes; Fluid consistency: Thin  Diet effective now       ?  ? ?  ?  ? ?  ? ? ?DVT prophylaxis:  ?heparin injection 5,000 Units Start: 10/20/21 0030 ?  ?Antimicrobials: IV vancomycin ?Fluid: None ?Consultants: Podiatry, vascular surgery ?Family Communication: Daughter at bedside ? ?Status is: Inpatient ? ?Continue in-hospital care because: Continue IV antibiotics, may need to amputation ?Level of care: Med-Surg  ? ?Dispo: The patient is from: Home ?             Anticipated d/c is to: Pending clinical course ?             Patient currently is not medically stable to d/c. ?  Difficult to place  patient No ? ? ? ? ?Infusions:  ? vancomycin    ? ? ?Scheduled Meds: ? amitriptyline  75 mg Oral QHS  ? aspirin EC  81 mg Oral Daily  ? atorvastatin  20 mg Oral Daily  ? clopidogrel  75 mg Oral Daily  ? gabapentin  600 mg Oral BID  ? heparin  5,000 Units Subcutaneous Q8H  ? losartan  100 mg Oral Daily  ? And  ? hydrochlorothiazide  25 mg Oral Daily  ? triamcinolone acetonide  10 mg Other Once  ? ? ?PRN meds: ?acetaminophen **OR** acetaminophen, ondansetron **OR** ondansetron (ZOFRAN) IV, oxyCODONE  ? ?Antimicrobials: ?Anti-infectives (From admission, onward)  ? ? Start     Dose/Rate Route Frequency Ordered Stop  ? 10/20/21 2300  vancomycin (VANCOCIN) IVPB 1000  mg/200 mL premix       ? 1,000 mg ?200 mL/hr over 60 Minutes Intravenous Every 24 hours 10/19/21 2233    ? 10/19/21 2300  vancomycin (VANCOREADY) IVPB 1500 mg/300 mL       ? 1,500 mg ?150 mL/hr over 120 Minutes Intravenous  Once 10/19/21 2233 10/20/21 0052  ? ?  ? ? ?Objective: ?Vitals:  ? 10/20/21 0616 10/20/21 0954  ?BP: 140/90 133/61  ?Pulse: 90 87  ?Resp: 19 16  ?Temp:  97.9 ?F (36.6 ?C)  ?SpO2: 99% 93%  ? ?No intake or output data in the 24 hours ending 10/20/21 1444 ?Filed Weights  ? 10/19/21 1510  ?Weight: 68.9 kg  ? ?Weight change:  ?Body mass index is 26.93 kg/m?.  ? ?Physical Exam: ?General exam: Pleasant, elderly Caucasian female.  Not in physical distress ?Skin: No rashes, lesions or ulcers. ?HEENT: Atraumatic, normocephalic, no obvious bleeding ?Lungs: Clear to auscultation bilaterally ?CVS: Regular rate and rhythm, no murmur ?GI/Abd soft, nontender, nondistended, bowel sound present ?CNS: Alert, awake, oriented x3 ?Psychiatry: Mood appropriate ?Extremities: Right leg with cellulitis up to the knee.  Right second and third toe with gangrenous changes ? ?Data Review: I have personally reviewed the laboratory data and studies available. ? ?F/u labs  ?Unresulted Labs (From admission, onward)  ? ?  Start     Ordered  ? 10/21/21 0500  CBC with Differential/Platelet  Tomorrow morning,   R       ? 10/20/21 0808  ? 10/21/21 2263  Basic metabolic panel  Tomorrow morning,   R       ? 10/20/21 0808  ? 10/19/21 2230  C-reactive protein  Once,   R       ? 10/19/21 2230  ? ?  ?  ? ?  ? ? ?Signed, ?Terrilee Croak, MD ?[facility] ?10/20/2021 ? ? ? ? ? ? ? ? ? ? ? ? ?

## 2021-10-20 NOTE — Progress Notes (Addendum)
  Progress Note    10/20/2021 2:53 PM Hospital Day 1  Subjective:  ready to have her toes amputated; daughter concerned about the left foot and swelling and purple color.   afebrile  Vitals:   10/20/21 0616 10/20/21 0954  BP: 140/90 133/61  Pulse: 90 87  Resp: 19 16  Temp:  97.9 F (36.6 C)  SpO2: 99% 93%    Physical Exam: General:  no distress; resting comfortably in hallway bed in ER Lungs:  non labored Extremities:  palpable AT pulses bilaterally; toes unchanged from picture previously taken.    CBC    Component Value Date/Time   WBC 10.4 10/20/2021 0242   RBC 3.17 (L) 10/20/2021 0242   HGB 8.9 (L) 10/20/2021 0242   HCT 27.4 (L) 10/20/2021 0242   PLT 423 (H) 10/20/2021 0242   MCV 86.4 10/20/2021 0242   MCH 28.1 10/20/2021 0242   MCHC 32.5 10/20/2021 0242   RDW 16.2 (H) 10/20/2021 0242   LYMPHSABS 1.5 10/20/2021 0242   MONOABS 0.8 10/20/2021 0242   EOSABS 0.3 10/20/2021 0242   BASOSABS 0.1 10/20/2021 0242    BMET    Component Value Date/Time   NA 134 (L) 10/20/2021 0242   K 4.1 10/20/2021 0242   CL 101 10/20/2021 0242   CO2 26 10/20/2021 0242   GLUCOSE 130 (H) 10/20/2021 0242   BUN 16 10/20/2021 0242   CREATININE 0.75 10/20/2021 0242   CALCIUM 8.9 10/20/2021 0242   GFRNONAA >60 10/20/2021 0242    INR No results found for: INR  No intake or output data in the 24 hours ending 10/20/21 1453   Assessment/Plan:  71 y.o. female with gangrenous 2nd and 3rd toes right foot Hospital Day 1  -pt with palpable AT pulses bilaterally; will need toe amputations on the right foot most likely 2nd and 3rd toes.  -daughter concerned with left foot discoloration and swelling.  She does have a palpable pulse in the left foot foot.  She has normal ABI on the left.  She most likely does have some venous insufficiency as she does have some swelling in that leg that is improved after she has been in bed.  -Dr. Trula Slade to see pt later today.   Leontine Locket,  PA-C [facility] [phone] 10/20/2021 2:53 PM  I agree with the above.  I have reviewed her duplex ultrasound which shows monophasic waveforms on the right leg.  Her stent appears to be patent but there is concern for possible proximal disease.  Therefore I will repeat her CT angiogram of the chest abdomen pelvis.  Annamarie Major

## 2021-10-20 NOTE — Consult Note (Signed)
?Subjective:  ?Patient ID: [name], female    DOB: 12-16-1950,  MRN: [id number] ? ?Patient with past medical history of PAD s/p right femoral stent March 2023, neuropathy , depression and vitamin D deficiency seen at beside today for right third digit gangrene and erythema or the leg. She is followed by Dr. Cannon Kettle who has been treating toe and was advised to got to ED for IV antibiotics and vascular work-up. Relates not having too much pain currently. Denies nausea, vomiting, fever or chills.  ? ?Past Medical History:  ?Diagnosis Date  ? Arthritis   ? joints  ? Asthma   ? allergic  ? Breast cancer ([place])   ? Gastric mass 05/24/2012  ? GERD (gastroesophageal reflux disease)   ? H/O hiatal hernia   ? Heart murmur   ? not MVP no treatment  ? Hyperlipidemia   ? Hypertension   ? Insomnia   ? Osteopenia   ? Osteoporosis   ? PONV (postoperative nausea and vomiting)   ? Rosacea   ? neck,cheeks  ?  ? ?Past Surgical History:  ?Procedure Laterality Date  ? ABDOMINAL AORTOGRAM W/LOWER EXTREMITY Right 08/25/2021  ? Procedure: ABDOMINAL AORTOGRAM W/LOWER EXTREMITY;  Surgeon: Broadus John, MD;  Location: [facility] LAB;  Service: Cardiovascular;  Laterality: Right;  ? ABDOMINAL HYSTERECTOMY    ? Bladder Tack    ? Blader Tack    ? BREAST LUMPECTOMY  04-1999  ? BREAST LUMPECTOMY  05-1999  ? COLON SURGERY  07-2010  ? COLONOSCOPY W/ POLYPECTOMY    ? EDG  with polypectomy (gastric polyps)    ? EUS  05/24/2012  ? Procedure: UPPER ENDOSCOPIC ULTRASOUND (EUS) LINEAR;  Surgeon: Milus Banister, MD;  Location: [facility];  Service: Endoscopy;  Laterality: N/A;  ? Lumpectomy 07-2000  2002  ? Port a cath Insertion  2001  ? PORT-A-CATH REMOVAL  05-2000  ? TONSILLECTOMY    ? ? ? ?  Latest Ref Rng & Units 10/20/2021  ?  2:42 AM 10/19/2021  ?  1:42 PM 08/25/2021  ?  8:37 AM  ?CBC  ?WBC 4.0 - 10.5 K/uL 10.4   11.1     ?Hemoglobin 12.0 - 15.0 g/dL 8.9   10.5   11.9    ?Hematocrit 36.0 - 46.0 % 27.4   33.4   35.0    ?Platelets 150 -  400 K/uL 423   513     ? ? ? ?  Latest Ref Rng & Units 10/20/2021  ?  2:42 AM 10/19/2021  ?  1:42 PM 08/25/2021  ?  8:37 AM  ?BMP  ?Glucose 70 - 99 mg/dL 130   119   109    ?BUN 8 - 23 mg/dL '16   17   27    '$ ?Creatinine 0.44 - 1.00 mg/dL 0.75   0.85   1.20    ?Sodium 135 - 145 mmol/L 134   134   134    ?Potassium 3.5 - 5.1 mmol/L 4.1   3.3   3.5    ?Chloride 98 - 111 mmol/L 101   95   94    ?CO2 22 - 32 mmol/L 26   28     ?Calcium 8.9 - 10.3 mg/dL 8.9   9.3     ? ? ? ?Objective:  ? ?Vitals:  ? 10/20/21 0616 10/20/21 0954  ?BP: 140/90 133/61  ?Pulse: 90 87  ?Resp: 19 16  ?Temp:  97.9 ?F (36.6 ?C)  ?  SpO2: 99% 93%  ? ? ?General:AA&O x 3. Normal mood and affect  ? ?Vascular: DP and PT pulses 2/4 bilateral. Brisk capillary refill to all digits. Pedal hair present  ? ?Neruological. Epicritic sensation grossly intact.  ? ?Derm: Necrotic distal right third digit. Surrounding erythema and edema tracking proximally up leg with multiple legs sores noted as well. Some duskiness noted to second right digit as well. Interspaces clears of maceration. Nails well groomed and normal in appearance ? ?MSK: MMT 5/5 in dorsiflexion, plantar flexion, inversion and eversion. Normal joint ROM without pain or crepitus.  ? ? ? ? ? ? ?MRI right foot  ?IMPRESSION: ?1. Soft tissue ulceration at the tip of the third toe. No evidence ?of osteomyelitis or abscess. ? ?VAS ABI  ?Summary:  ?Right: Resting right ankle-brachial index is within normal range, however  ?given previous noncompressible ABI and abnormal waveforms, this is likely  ?falsely elevated. The right toe-brachial index is abnormal.  ? ?Left: Resting left ankle-brachial index is within normal range. No  ?evidence of significant left lower extremity arterial disease. The left  ?toe-brachial index is abnormal.  ? ? ?Assessment & Plan:  ?Patient was evaluated and treated and all questions answered. ? ?DX: Right third digit dry gangrene and cellulitis  ?Wound care: betadine to affected  toe. ?Antibiotics: Broad spectrum IV antibiotics  ?DME: Post-op shoe   ?Discussed with patient diagnosis and treatment options.  ?Imaging reviewed. Radiographs and MRI negative for any underlying osteomyelitis or abscess. ABIs showing non-compressible vessels and abnormal TBI bilateral.  ?Discussed with patient treatment options for third digit gangrene. Will await vascular plan and intervention. As there is not any underlying infection in bone discussed allowing area to demarcate following possible vascular intervention. Discussed if toe amputation is needed will likely be able to do outpatient at later date. No podiatric surgical plans at this time.   ?Patient in agreement with plan and all questions answered.  ? ?Lorenda Peck, DPM ? ?Accessible via secure chat for questions or concerns. ? ?

## 2021-10-20 NOTE — H&P (Signed)
?History and Physical  ? ? ?[name] NFA:[id number] DOB: 1950/07/03 DOA: 10/19/2021 ? ?DOS: the patient was seen and examined on 10/19/2021 ? ?PCP: [name], MD  ? ?Patient coming from: [facility] podiatry clinic ? ?I have personally briefly reviewed patient's old medical records in [place] ? ?CC: right 3rd toe gangrene ?HPI: ?71 year old white female history of peripheral vascular disease status post right femoral stent in March 2023 by Dr. Unk Lightning, history of idiopathic neuropathy, vitamin D deficiency, insomnia, depression who presents to the ER today from the podiatry office.  Patient was seen in follow-up.  She has had toe ulcerations on her right foot for last several weeks now.  She was treated as an outpatient by her PCP with amoxicillin.  She is followed up by podiatry.  She was noted to have dry gangrene of her third right toe.  Patient is also had erythema and pain in the foot and up the leg.  Patient sent to the ER for evaluation. ? ?On arrival temp 99.1 heart rate 93 blood pressure 120/72. ? ?White count 11.1 hemoglobin of 10.5, platelets 513 ?Sodium 134, potassium 3.3, BUN of 17, creatinine 0.85. ? ?Sed rate 35. ? ?[facility] hospitalist contacted for admission. ?  ? ?ED Course: MRI foot ordered. IV vanco started ? ?Review of Systems:  ?Review of Systems  ?Constitutional:  Negative for chills and fever.  ?HENT: Negative.    ?Eyes: Negative.   ?Respiratory: Negative.    ?Cardiovascular: Negative.   ?Gastrointestinal: Negative.   ?Genitourinary: Negative.   ?Musculoskeletal:   ?     Pain in right foot  ?Skin:   ?     Increased erythema of the right foot.  Right third toe has been black.  Pain in the right foot.  ?Neurological: Negative.   ?Endo/Heme/Allergies: Negative.   ?Psychiatric/Behavioral: Negative.    ?All other systems reviewed and are negative. ? ?Past Medical History:  ?Diagnosis Date  ? Arthritis   ? joints  ? Asthma   ? allergic  ? Breast cancer ([place])   ? Gastric mass 05/24/2012  ?  GERD (gastroesophageal reflux disease)   ? H/O hiatal hernia   ? Heart murmur   ? not MVP no treatment  ? Hyperlipidemia   ? Hypertension   ? Insomnia   ? Osteopenia   ? Osteoporosis   ? PONV (postoperative nausea and vomiting)   ? Rosacea   ? neck,cheeks  ? ? ?Past Surgical History:  ?Procedure Laterality Date  ? ABDOMINAL AORTOGRAM W/LOWER EXTREMITY Right 08/25/2021  ? Procedure: ABDOMINAL AORTOGRAM W/LOWER EXTREMITY;  Surgeon: Broadus John, MD;  Location: [facility];  Service: Cardiovascular;  Laterality: Right;  ? ABDOMINAL HYSTERECTOMY    ? Bladder Tack    ? Blader Tack    ? BREAST LUMPECTOMY  04-1999  ? BREAST LUMPECTOMY  05-1999  ? COLON SURGERY  07-2010  ? COLONOSCOPY W/ POLYPECTOMY    ? EDG  with polypectomy (gastric polyps)    ? EUS  05/24/2012  ? Procedure: UPPER ENDOSCOPIC ULTRASOUND (EUS) LINEAR;  Surgeon: Milus Banister, MD;  Location: [facility];  Service: Endoscopy;  Laterality: N/A;  ? Lumpectomy 07-2000  2002  ? Port a cath Insertion  2001  ? PORT-A-CATH REMOVAL  05-2000  ? TONSILLECTOMY    ? ? ? reports that she has never smoked. She has never used smokeless tobacco. She reports that she does not drink alcohol and does not use drugs. ? ?Allergies  ?  Allergen Reactions  ? Latex Itching  ? Levofloxacin   ?  Possible tendon rupture  ? Morphine And Related Nausea Only  ? ? ?No family history on file. ? ?Prior to Admission medications   ?Medication Sig Start Date End Date Taking? Authorizing Provider  ?amitriptyline (ELAVIL) 75 MG tablet Take 75 mg by mouth at bedtime. 06/23/21   [provider]  ?amoxicillin-clavulanate (AUGMENTIN) 875-125 MG tablet Take 1 tablet by mouth 2 (two) times daily. 09/14/21   Landis Martins, DPM  ?aspirin EC 81 MG tablet Take 81 mg by mouth daily. Swallow whole.    [provider]  ?atorvastatin (LIPITOR) 20 MG tablet Take 20 mg by mouth daily. 08/06/14   [provider]  ?calcium carbonate (OS-CAL) 600 MG TABS Take 600 mg by mouth 2 (two)  times daily with a meal. Vitamin D 400 mg    [provider]  ?celecoxib (CELEBREX) 200 MG capsule Take 200 mg by mouth 2 (two) times daily. 04/12/21   [provider]  ?Cholecalciferol (VITAMIN D3 PO) Take 5,000 Units by mouth daily.    [provider]  ?clopidogrel (PLAVIX) 75 MG tablet Take 1 tablet (75 mg total) by mouth daily. 08/25/21 08/25/22  Broadus John, MD  ?Coenzyme Q10 (COQ10 PO) Take 200 mg by mouth daily.    [provider]  ?doxycycline (VIBRA-TABS) 100 MG tablet Take 100 mg by mouth 2 (two) times daily. 10/04/21   [provider]  ?fluticasone (FLONASE) 50 MCG/ACT nasal spray Place 1 spray into both nostrils daily as needed for allergies or rhinitis.    [provider]  ?furosemide (LASIX) 40 MG tablet Take 40 mg by mouth 2 (two) times daily as needed for fluid or edema. 06/24/21   [provider]  ?gabapentin (NEURONTIN) 600 MG tablet Take 600 mg by mouth 2 (two) times daily. 01/27/21   [provider]  ?losartan-hydrochlorothiazide (HYZAAR) 100-25 MG tablet Take 1 tablet by mouth daily. 06/23/21   [provider]  ?MAGNESIUM PO Take 400 mg by mouth at bedtime.    [provider]  ?montelukast (SINGULAIR) 10 MG tablet Take 10 mg by mouth at bedtime. 04/10/21   [provider]  ?Multiple Vitamin (MULTIVITAMIN) tablet Take 1 tablet by mouth daily.    [provider]  ?mupirocin ointment (BACTROBAN) 2 % Apply topically 2 (two) times daily. 10/12/21   [provider]  ?neomycin-bacitracin-polymyxin (NEOSPORIN) ointment Apply 1 application. topically daily as needed for wound care.    [provider]  ?pantoprazole (PROTONIX) 40 MG tablet Take 40 mg by mouth daily. 01/08/20   [provider]  ?raloxifene (EVISTA) 60 MG tablet Take 60 mg by mouth daily.    [provider]  ? ? ?Physical Exam: ?Vitals:  ? 10/19/21 1204 10/19/21 1510 10/19/21 1949 10/19/21 2246  ?BP: 120/72   138/83 138/70  ?Pulse: 93  90 84  ?Resp: '20  15 16  '$ ?Temp: 99.1 ?F (37.3 ?C)  98.8 ?F (37.1 ?C)   ?TempSrc: Oral  Oral   ?SpO2: 96%  95% 96%  ?Weight:  68.9 kg    ?Height:  '5\' 3"'$  (1.6 m)    ? ? ?Physical Exam ?Vitals and nursing note reviewed.  ?Constitutional:   ?   General: She is not in acute distress. ?   Appearance: Normal appearance. She is not ill-appearing, toxic-appearing or diaphoretic.  ?HENT:  ?   Head: Normocephalic and atraumatic.  ?   Nose: Nose normal.  No rhinorrhea.  ?Cardiovascular:  ?   Rate and Rhythm: Normal rate and regular rhythm.  ?Pulmonary:  ?   Effort: Pulmonary effort is normal. No respiratory distress.  ?   Breath sounds: Normal breath sounds.  ?Abdominal:  ?   General: Abdomen is flat. Bowel sounds are normal. There is no distension.  ?   Tenderness: There is no abdominal tenderness.  ?Skin: ?   Capillary Refill: Capillary refill takes less than 2 seconds.  ?   Comments: See pictures of right foot right leg  ?Neurological:  ?   General: No focal deficit present.  ?   Mental Status: She is alert and oriented to person, place, and time.  ?  ? ? ? ? ? ? ? ? ? ? ? ? ? ? ? ? ?Labs on Admission: I have personally reviewed following labs and imaging studies ? ?CBC: ?Recent Labs  ?Lab 10/19/21 ?1342  ?WBC 11.1*  ?NEUTROABS 8.2*  ?HGB 10.5*  ?HCT 33.4*  ?MCV 87.0  ?PLT 513*  ? ?Basic Metabolic Panel: ?Recent Labs  ?Lab 10/19/21 ?1342  ?NA 134*  ?K 3.3*  ?CL 95*  ?CO2 28  ?GLUCOSE 119*  ?BUN 17  ?CREATININE 0.85  ?CALCIUM 9.3  ? ?GFR: ?Estimated Creatinine Clearance: 56.5 mL/min (by C-G formula based on SCr of 0.85 mg/dL). ?Liver Function Tests: ?No results for input(s): AST, ALT, ALKPHOS, BILITOT, PROT, ALBUMIN in the last 168 hours. ?No results for input(s): LIPASE, AMYLASE in the last 168 hours. ?No results for input(s): AMMONIA in the last 168 hours. ?Coagulation Profile: ?No results for input(s): INR, PROTIME in the last 168 hours. ?Cardiac Enzymes: ?No results for input(s): CKTOTAL, CKMB,  CKMBINDEX, TROPONINI, TROPONINIHS in the last 168 hours. ?BNP (last 3 results) ?No results for input(s): PROBNP in the last 8760 hours. ?HbA1C: ?No results for input(s): HGBA1C in the last 72 hours.

## 2021-10-20 NOTE — Plan of Care (Signed)
Attempted to see patient this morning but in imaging for MRI. Will plan to see later today. Will follow-up on MRI and vascular recommendations in regards to right third toe.  ? ?Lorenda Peck, DPM ?

## 2021-10-21 ENCOUNTER — Inpatient Hospital Stay (HOSPITAL_COMMUNITY): Payer: Medicare HMO

## 2021-10-21 DIAGNOSIS — I96 Gangrene, not elsewhere classified: Secondary | ICD-10-CM | POA: Diagnosis not present

## 2021-10-21 LAB — CBC WITH DIFFERENTIAL/PLATELET
Abs Immature Granulocytes: 0.08 10*3/uL — ABNORMAL HIGH (ref 0.00–0.07)
Basophils Absolute: 0 10*3/uL (ref 0.0–0.1)
Basophils Relative: 0 %
Eosinophils Absolute: 0.2 10*3/uL (ref 0.0–0.5)
Eosinophils Relative: 2 %
HCT: 26.8 % — ABNORMAL LOW (ref 36.0–46.0)
Hemoglobin: 8.8 g/dL — ABNORMAL LOW (ref 12.0–15.0)
Immature Granulocytes: 1 %
Lymphocytes Relative: 13 %
Lymphs Abs: 1.3 10*3/uL (ref 0.7–4.0)
MCH: 28.1 pg (ref 26.0–34.0)
MCHC: 32.8 g/dL (ref 30.0–36.0)
MCV: 85.6 fL (ref 80.0–100.0)
Monocytes Absolute: 0.9 10*3/uL (ref 0.1–1.0)
Monocytes Relative: 9 %
Neutro Abs: 7.5 10*3/uL (ref 1.7–7.7)
Neutrophils Relative %: 75 %
Platelets: 409 10*3/uL — ABNORMAL HIGH (ref 150–400)
RBC: 3.13 MIL/uL — ABNORMAL LOW (ref 3.87–5.11)
RDW: 16.4 % — ABNORMAL HIGH (ref 11.5–15.5)
WBC: 10 10*3/uL (ref 4.0–10.5)
nRBC: 0 % (ref 0.0–0.2)

## 2021-10-21 LAB — BASIC METABOLIC PANEL
Anion gap: 7 (ref 5–15)
BUN: 14 mg/dL (ref 8–23)
CO2: 24 mmol/L (ref 22–32)
Calcium: 8.6 mg/dL — ABNORMAL LOW (ref 8.9–10.3)
Chloride: 102 mmol/L (ref 98–111)
Creatinine, Ser: 0.89 mg/dL (ref 0.44–1.00)
GFR, Estimated: >60 mL/min (ref >60)
Glucose, Bld: 159 mg/dL — ABNORMAL HIGH (ref 70–99)
Potassium: 4.1 mmol/L (ref 3.5–5.1)
Sodium: 133 mmol/L — ABNORMAL LOW (ref 135–145)

## 2021-10-21 MED ORDER — HYDRALAZINE HCL 20 MG/ML IJ SOLN
10.0000 mg | Freq: Four times a day (QID) | INTRAMUSCULAR | Status: DC | PRN
Start: 2021-10-21 — End: 2021-10-23
  Administered 2021-10-23: 10 mg via INTRAVENOUS
  Filled 2021-10-21: qty 1

## 2021-10-21 MED ORDER — ASPIRIN 81 MG PO TBEC
81.0000 mg | DELAYED_RELEASE_TABLET | Freq: Every day | ORAL | Status: DC
  Administered 2021-10-21 – 2021-10-23 (×3): 81 mg via ORAL
  Filled 2021-10-21 (×3): qty 1

## 2021-10-21 MED ORDER — IOHEXOL 350 MG/ML SOLN
100.0000 mL | Freq: Once | INTRAVENOUS | Status: AC | PRN
  Administered 2021-10-21: 100 mL via INTRAVENOUS

## 2021-10-21 NOTE — Progress Notes (Signed)
PROGRESS NOTE  [name]  DOB: 1950-07-17  PCP: [name], MD DQQ:[id number]  DOA: 10/19/2021  LOS: 2 days  Hospital Day: 3  Brief narrative: [name] is a 71 y.o. female with PMH significant for PAD s/p right femoral stent in March 2023 by Dr. Unk Lightning, HTN, HLD, arthritis, breast cancer, GERD, gastric mass, history of idiopathic neuropathy, vitamin D deficiency, insomnia, depression. She had a toe ulceration on the right foot for last several weeks and was treated by her PCP for amoxicillin.   5/16, patient was seen at podiatrist office for follow-up.  She was noted to have a dry gangrene of her third right toe.  She also had evidence of cellulitis up to the leg and hence sent to the ED for evaluation. In the ED, she had a temperature of 99.1, WBC count of 11.1 X-ray of the foot did not show any evidence of acute bony abnormality but showed soft tissue swelling of the third toe and dorsum of the foot Admitted to hospitalist service.  Patient was started on IV vancomycin. Podiatry and vascular surgery consultation was called. 5/17, MRI of the foot showed soft tissue ulceration at the tip of the third toe.  No evidence of osteomyelitis or abscess.    Subjective: Patient was seen and examined this afternoon.  Pleasant elderly Caucasian female.  Lying down in bed.  Not in distress.  Daughter at bedside. Chart reviewed No fever, hemodynamically stable  Principal Problem:   Gangrene of toe of right foot ([place]) Active Problems:   PAD (peripheral artery disease) ([facility])   Idiopathic neuropathy   Vitamin D deficiency   Insomnia   Depression    Assessment and Plan: Gangrene of toe of right foot -Patient had ulceration of the right toes did not respond to outpatient antibiotic treatment.   -Clinically with cellulitis and gangrene of the toes. X-ray and MRI did not show any evidence of osteomyelitis.   -Podiatry and vascular surgery consulted.  Underwent CT angio of chest  abdomen pelvis today.  I did not see any evidence of vascular stenosis requiring urgent intervention. -Currently on IV vancomycin.   -Continue pain control with oxycodone -Inpatient versus outpatient amputation by podiatry.   PAD (peripheral artery disease)  Hyperlipidemia -s/p prior right femoral stent in 08/2021.  -Continue asa, plavix and statin -Vascular surgery consulted  Essential hypertension -In anticipation of surgery, I kept on hold losartan, HCTZ   Depression -Stable.    Insomnia -Continue elavil.   Vitamin D deficiency -Continue vitamin D.   Idiopathic neuropathy -Continue neurontin.  Goals of care   Code Status: Full Code    Mobility: May need PT evaluation after procedure  Skin assessment:     Nutritional status:  Body mass index is 26.52 kg/m.          Diet:  Diet Order             Diet Heart Room service appropriate? Yes; Fluid consistency: Thin  Diet effective now                   DVT prophylaxis:  heparin injection 5,000 Units Start: 10/20/21 0030   Antimicrobials: IV vancomycin Fluid: None Consultants: Podiatry, vascular surgery Family Communication: Son at bedside  Status is: Inpatient  Continue in-hospital care because: Continue IV antibiotics, may need to amputation Level of care: Med-Surg   Dispo: The patient is from: Home              Anticipated  d/c is to: Pending clinical course.  If amputation to be done as an outpatient, we may be able to discharge her tomorrow.              Patient currently is not medically stable to d/c.   Difficult to place patient No     Infusions:   vancomycin 1,000 mg (10/21/21 0020)    Scheduled Meds:  amitriptyline  75 mg Oral QHS   aspirin EC  81 mg Oral Daily   atorvastatin  20 mg Oral Daily   clopidogrel  75 mg Oral Daily   gabapentin  600 mg Oral BID   heparin  5,000 Units Subcutaneous Q8H    PRN meds: acetaminophen **OR** acetaminophen, hydrALAZINE, ondansetron **OR**  ondansetron (ZOFRAN) IV, oxyCODONE   Antimicrobials: Anti-infectives (From admission, onward)    Start     Dose/Rate Route Frequency Ordered Stop   10/20/21 2300  vancomycin (VANCOCIN) IVPB 1000 mg/200 mL premix        1,000 mg 200 mL/hr over 60 Minutes Intravenous Every 24 hours 10/19/21 2233     10/19/21 2300  vancomycin (VANCOREADY) IVPB 1500 mg/300 mL        1,500 mg 150 mL/hr over 120 Minutes Intravenous  Once 10/19/21 2233 10/20/21 0052       Objective: Vitals:   10/21/21 0451 10/21/21 0745  BP: 127/68 (!) 142/67  Pulse: 88 90  Resp: 17 18  Temp: 98.4 F (36.9 C) 98.5 F (36.9 C)  SpO2: 97% 99%    Intake/Output Summary (Last 24 hours) at 10/21/2021 1403 Last data filed at 10/21/2021 0400 Gross per 24 hour  Intake 200 ml  Output --  Net 200 ml   Filed Weights   10/19/21 1510 10/21/21 0500  Weight: 68.9 kg 67.9 kg   Weight change: -1.047 kg Body mass index is 26.52 kg/m.   Physical Exam: General exam: Pleasant, elderly Caucasian female.  Not in physical distress Skin: No rashes, lesions or ulcers. HEENT: Atraumatic, normocephalic, no obvious bleeding Lungs: Clear to auscultation bilaterally CVS: Regular rate and rhythm, no murmur GI/Abd soft, nontender, nondistended, bowel sound present CNS: Alert, awake, oriented x3 Psychiatry: Mood appropriate Extremities: Right leg with cellulitis up to the knee.  Right second and third toe with gangrenous changes  Data Review: I have personally reviewed the laboratory data and studies available.  F/u labs  Unresulted Labs (From admission, onward)     Start     Ordered   10/19/21 2230  C-reactive protein  Once,   R        10/19/21 2230            Signed, Terrilee Croak, MD [facility] 10/21/2021

## 2021-10-21 NOTE — Progress Notes (Signed)
Mobility Specialist Progress Note:   10/21/21 0947  Mobility  Activity Off unit   Will follow-up as time allows.   [facility] Public librarian Phone [phone]

## 2021-10-21 NOTE — Progress Notes (Addendum)
  Daily Progress Note  71 y.o. female with PMH significant for PAD s/p right femoral stent in March 2023 for SFA atheroembolic lesion.  Subjective: Compa  Objective: Vitals:   10/21/21 0451 10/21/21 0745  BP: 127/68 (!) 142/67  Pulse: 88 90  Resp: 17 18  Temp: 98.4 F (36.9 C) 98.5 F (36.9 C)  SpO2: 97% 99%    Physical Examination General:  no distress; resting comfortably in hallway bed in ER Lungs:  non labored Extremities:  palpable AT pulses bilaterally; toes unchanged from picture previously taken.   ASSESSMENT/PLAN:  I reviewed Deshundra's imaging.  Her right lower extremity perfusion is optimized. I am concerned about wounds that she has on the calf.  Shakiera noted an episode of severe edema that led to blistering.  While this is improved, she continues to have wounds as well as some cellulitis.  Recommend a course of antibiotics for right lower extremity cellulitis. Will defer toe amputations to Dr. Cannon Kettle / Dr. Blenda Mounts. At this time, Erian's lower extremity perfusion has been optimized.  She does have notable small vessel disease with very little filling at the level of the toes.  Saniyah would benefit from right lower extremity compression once the cellulitis has improved.  She has a follow-up appointment scheduled with me next month.   Cassandria Santee MD [facility] [phone] 10/21/2021  3:14 PM

## 2021-10-22 DIAGNOSIS — I96 Gangrene, not elsewhere classified: Secondary | ICD-10-CM | POA: Diagnosis not present

## 2021-10-22 NOTE — Progress Notes (Signed)
PROGRESS NOTE  [name]  DOB: 04/14/1951  PCP: [name], MD KDT:[id number]  DOA: 10/19/2021  LOS: 3 days  Hospital Day: 4  Brief narrative: [name] is a 71 y.o. female with PMH significant for PAD s/p right femoral stent in March 2023 by Dr. Unk Lightning, HTN, HLD, arthritis, breast cancer, GERD, gastric mass, history of idiopathic neuropathy, vitamin D deficiency, insomnia, depression. She had a toe ulceration on the right foot for last several weeks and was treated by her PCP for amoxicillin.   5/16, patient was seen at podiatrist office for follow-up.  She was noted to have a dry gangrene of her third right toe.  She also had evidence of cellulitis up to the leg and hence sent to the ED for evaluation. In the ED, she had a temperature of 99.1, WBC count of 11.1 X-ray of the foot did not show any evidence of acute bony abnormality but showed soft tissue swelling of the third toe and dorsum of the foot Admitted to hospitalist service.  Patient was started on IV vancomycin. Podiatry and vascular surgery consultation was called. 5/17, MRI of the foot showed soft tissue ulceration at the tip of the third toe.  No evidence of osteomyelitis or abscess.    Subjective: Patient was seen and examined this morning.  Presented Zic[place]in subjective lying down in bed.  Not in distress.  Son at bedside.  Patient is concerned about the wheezing coming out of her right leg today.   Principal Problem:   Gangrene of toe of right foot ([place]) Active Problems:   PAD (peripheral artery disease) (HCC)   Idiopathic neuropathy   Vitamin D deficiency   Insomnia   Depression    Assessment and Plan: Gangrene of toe of right foot -Patient had ulceration of the right toes did not respond to outpatient antibiotic treatment.   -Clinically with cellulitis and gangrene of the toes. X-ray and MRI did not show any evidence of osteomyelitis.   -Podiatry and vascular surgery consulted.  Underwent CT  angio of chest abdomen pelvis today.  I did not see any evidence of vascular stenosis requiring urgent intervention. -Currently on IV vancomycin.  Clinically seems improving.  Swelling and redness improving on the right leg.   -Continue pain control with oxycodone   PAD (peripheral artery disease)  Hyperlipidemia -s/p prior right femoral stent in 08/2021.  -Continue asa, plavix and statin -Vascular surgery consulted  Essential hypertension -In anticipation of surgery, losartan and HCTZ were held.  Okay to resume.   Depression -Stable.    Insomnia -Continue elavil.   Vitamin D deficiency -Continue vitamin D.   Idiopathic neuropathy -Continue neurontin.  Goals of care   Code Status: Full Code    Mobility: May need PT evaluation after procedure  Skin assessment:     Nutritional status:  Body mass index is 26.17 kg/m.          Diet:  Diet Order             Diet Carb Modified Fluid consistency: Thin; Room service appropriate? Yes  Diet effective now                   DVT prophylaxis:  heparin injection 5,000 Units Start: 10/20/21 0030   Antimicrobials: IV vancomycin Fluid: None Consultants: Podiatry, vascular surgery Family Communication: Son at bedside  Status is: Inpatient  Continue in-hospital care because: Continue IV antibiotics, inpatient versus outpatient amputation. Level of care: Med-Surg   Dispo: The  patient is from: Home              Anticipated d/c is to: Pending clinical course.  Home today or tomorrow              patient currently is not medically stable to d/c.   Difficult to place patient No     Infusions:   vancomycin 1,000 mg (10/21/21 2329)    Scheduled Meds:  amitriptyline  75 mg Oral QHS   aspirin EC  81 mg Oral Daily   atorvastatin  20 mg Oral Daily   clopidogrel  75 mg Oral Daily   gabapentin  600 mg Oral BID   heparin  5,000 Units Subcutaneous Q8H    PRN meds: acetaminophen **OR** acetaminophen, hydrALAZINE,  ondansetron **OR** ondansetron (ZOFRAN) IV, oxyCODONE   Antimicrobials: Anti-infectives (From admission, onward)    Start     Dose/Rate Route Frequency Ordered Stop   10/20/21 2300  vancomycin (VANCOCIN) IVPB 1000 mg/200 mL premix        1,000 mg 200 mL/hr over 60 Minutes Intravenous Every 24 hours 10/19/21 2233     10/19/21 2300  vancomycin (VANCOREADY) IVPB 1500 mg/300 mL        1,500 mg 150 mL/hr over 120 Minutes Intravenous  Once 10/19/21 2233 10/20/21 0052       Objective: Vitals:   10/22/21 0508 10/22/21 0741  BP: (!) 150/77 131/73  Pulse: 90 91  Resp: 16 18  Temp: 98.4 F (36.9 C) 98.1 F (36.7 C)  SpO2: 99% 99%    Intake/Output Summary (Last 24 hours) at 10/22/2021 1246 Last data filed at 10/21/2021 1830 Gross per 24 hour  Intake 480 ml  Output --  Net 480 ml    Filed Weights   10/19/21 1510 10/21/21 0500 10/22/21 0526  Weight: 68.9 kg 67.9 kg 67 kg   Weight change: -0.9 kg Body mass index is 26.17 kg/m.   Physical Exam: General exam: Pleasant, elderly Caucasian female.  Not in physical distress Skin: No rashes, lesions or ulcers. HEENT: Atraumatic, normocephalic, no obvious bleeding Lungs: Clear to auscultation bilaterally CVS: Regular rate and rhythm, no murmur GI/Abd soft, nontender, nondistended, bowel sound present CNS: Alert, awake, oriented x3 Psychiatry: Mood appropriate Extremities: Right leg cellulitis improving.  Right second and third toe with gangrenous changes  Data Review: I have personally reviewed the laboratory data and studies available.  F/u labs  Unresulted Labs (From admission, onward)    None       Signed, Terrilee Croak, MD [facility] 10/22/2021

## 2021-10-22 NOTE — Plan of Care (Signed)
  Problem: Nutrition: Goal: Adequate nutrition will be maintained Outcome: Progressing   Problem: Pain Managment: Goal: General experience of comfort will improve Outcome: Progressing   Problem: Safety: Goal: Ability to remain free from injury will improve Outcome: Progressing   

## 2021-10-22 NOTE — Care Management Important Message (Signed)
Important Message  Patient Details  Name: [name] MRN: [id number] Date of Birth: November 19, 1950   Medicare Important Message Given:  Yes     Orbie Pyo 10/22/2021, 4:21 PM

## 2021-10-22 NOTE — Progress Notes (Signed)
Pharmacy Antibiotic Note  [name] is a 71 y.o. female admitted on 10/19/2021 with cellulitis.  Pharmacy has been consulted for Vancomycin dosing.  Plan: Continue Vancomycin 1000 mg IV q24h (eAUC 437.5, SCr 0.85, goal AUC 400-550) Follow-up renal function, cultures, LOT, and de-escalate as able  Noted plan home tomorrow -likely with po abx   Height: '5\' 3"'$  (160 cm) Weight: 67 kg (147 lb 11.3 oz) IBW/kg (Calculated) : 52.4  Temp (24hrs), Avg:98.4 F (36.9 C), Min:98.1 F (36.7 C), Max:98.6 F (37 C)  Recent Labs  Lab 10/19/21 1342 10/20/21 0242 10/21/21 0055  WBC 11.1* 10.4 10.0  CREATININE 0.85 0.75 0.89     Estimated Creatinine Clearance: 53.3 mL/min (by C-G formula based on SCr of 0.89 mg/dL).    Allergies  Allergen Reactions   Latex Itching   Levofloxacin     Possible tendon rupture   Morphine And Related Nausea Only    Antimicrobials this admission: Vancomycin 5/16 >>   Microbiology results: 5/16 BCx: ngtd 5/16 Wound Cx: rare staph epi   Thank you for allowing pharmacy to be a part of this patient's care.  [name], PharmD, BCPS Please see amion for complete clinical pharmacist phone list 10/22/2021 1:36 PM

## 2021-10-22 NOTE — Plan of Care (Signed)
Spoke to patient this morning about treatment plan. Discussed that her blood flow to lower extremity is optimized however she does still have some small vessel disease. Discussed amputation of third toe and changes noted to second toe. Discussed at this point due to their being no acute infection in the toe we can wait for toes to further demarcate and follow-up outpatient. No plans for surgery today. Patient will be ok for discharge from podiatry standpoint and will follow-up in one week in our clinic. Patient expressed understanding.    Lorenda Peck, DPM

## 2021-10-22 NOTE — Progress Notes (Signed)
Subjective:  Patient ID: [name], female    DOB: 04-Dec-1950,  MRN: [id number]  Patient seen at bedside this evening. Relates she is doing well and pain controlled. Has been optimized from vascular standpoint.   Past Medical History:  Diagnosis Date   Arthritis    joints   Asthma    allergic   Breast cancer ([place])    Gastric mass 05/24/2012   GERD (gastroesophageal reflux disease)    H/O hiatal hernia    Heart murmur    not MVP no treatment   Hyperlipidemia    Hypertension    Insomnia    Osteopenia    Osteoporosis    PONV (postoperative nausea and vomiting)    Rosacea    neck,cheeks     Past Surgical History:  Procedure Laterality Date   ABDOMINAL AORTOGRAM W/LOWER EXTREMITY Right 08/25/2021   Procedure: ABDOMINAL AORTOGRAM W/LOWER EXTREMITY;  Surgeon: Broadus John, MD;  Location: [facility];  Service: Cardiovascular;  Laterality: Right;   ABDOMINAL HYSTERECTOMY     Bladder Tack     Blader Tack     BREAST LUMPECTOMY  04-1999   BREAST LUMPECTOMY  05-1999   COLON SURGERY  07-2010   COLONOSCOPY W/ POLYPECTOMY     EDG  with polypectomy (gastric polyps)     EUS  05/24/2012   Procedure: UPPER ENDOSCOPIC ULTRASOUND (EUS) LINEAR;  Surgeon: Milus Banister, MD;  Location: [facility];  Service: Endoscopy;  Laterality: N/A;   Lumpectomy 07-2000  2002   Port a cath Insertion  2001   PORT-A-CATH REMOVAL  05-2000   TONSILLECTOMY         Latest Ref Rng & Units 10/21/2021   12:55 AM 10/20/2021    2:42 AM 10/19/2021    1:42 PM  CBC  WBC 4.0 - 10.5 K/uL 10.0   10.4   11.1    Hemoglobin 12.0 - 15.0 g/dL 8.8   8.9   10.5    Hematocrit 36.0 - 46.0 % 26.8   27.4   33.4    Platelets 150 - 400 K/uL 409   423   513         Latest Ref Rng & Units 10/21/2021   12:55 AM 10/20/2021    2:42 AM 10/19/2021    1:42 PM  BMP  Glucose 70 - 99 mg/dL 159   130   119    BUN 8 - 23 mg/dL '14   16   17    '$ Creatinine 0.44 - 1.00 mg/dL 0.89   0.75   0.85    Sodium 135 - 145  mmol/L 133   134   134    Potassium 3.5 - 5.1 mmol/L 4.1   4.1   3.3    Chloride 98 - 111 mmol/L 102   101   95    CO2 22 - 32 mmol/L '24   26   28    '$ Calcium 8.9 - 10.3 mg/dL 8.6   8.9   9.3       Objective:   Vitals:   10/22/21 0741 10/22/21 1616  BP: 131/73 128/76  Pulse: 91 80  Resp: 18 16  Temp: 98.1 F (36.7 C) 98.4 F (36.9 C)  SpO2: 99% 100%    General:AA&O x 3. Normal mood and affect   Vascular: DP and PT pulses 2/4 bilateral. Brisk capillary refill to all digits. Pedal hair present   Neruological. Epicritic sensation grossly intact.   Derm: Necrotic  distal right third digit. Surrounding erythema and edema tracking proximally up leg with multiple legs sores noted as well. Some duskiness noted to second right digit as well. Interspaces clears of maceration. Nails well groomed and normal in appearance    MSK: MMT 5/5 in dorsiflexion, plantar flexion, inversion and eversion. Normal joint ROM without pain or crepitus.    MRI right foot  IMPRESSION: 1. Soft tissue ulceration at the tip of the third toe. No evidence of osteomyelitis or abscess.   VAS ABI  Summary:  Right: Resting right ankle-brachial index is within normal range, however  given previous noncompressible ABI and abnormal waveforms, this is likely  falsely elevated. The right toe-brachial index is abnormal.   Left: Resting left ankle-brachial index is within normal range. No  evidence of significant left lower extremity arterial disease. The left  toe-brachial index is abnormal.      Assessment & Plan:  Patient was evaluated and treated and all questions answered.  Discussed that her blood flow to lower extremity is optimized however she does still have some small vessel disease. Discussed amputation of third toe and changes noted to second toe. Discussed at this point due to their being no acute infection in the toe we can wait for toes to further demarcate and follow-up outpatient. No plans for  surgery. Patient will be ok for discharge from podiatry standpoint and will follow-up in one week in our clinic. Should be discharged on two weeks of bactrim. Will dress daily with betadine and DSD to toes and leg wounds. Patient expressed understanding.   Lorenda Peck, DPM  Accessible via secure chat for questions or concerns.

## 2021-10-23 DIAGNOSIS — I96 Gangrene, not elsewhere classified: Secondary | ICD-10-CM | POA: Diagnosis not present

## 2021-10-23 LAB — AEROBIC CULTURE W GRAM STAIN (SUPERFICIAL SPECIMEN): Gram Stain: NONE SEEN

## 2021-10-23 MED ORDER — TRAMADOL HCL 50 MG PO TABS: 50.0000 mg | ORAL_TABLET | Freq: Four times a day (QID) | ORAL | 0 refills | Status: AC | PRN

## 2021-10-23 MED ORDER — SACCHAROMYCES BOULARDII 250 MG PO CAPS: 250.0000 mg | ORAL_CAPSULE | Freq: Two times a day (BID) | ORAL | 0 refills | Status: AC

## 2021-10-23 MED ORDER — SULFAMETHOXAZOLE-TRIMETHOPRIM 800-160 MG PO TABS: 1.0000 | ORAL_TABLET | Freq: Two times a day (BID) | ORAL | 0 refills | Status: AC

## 2021-10-23 NOTE — Discharge Summary (Signed)
Physician Discharge Summary  [name] GYJ:[id number] DOB: 12-10-1950 DOA: 10/19/2021  PCP: [name], MD  Admit date: 10/19/2021 Discharge date: 10/23/2021  Admitted From: home Discharge disposition: Home  Recommendations at discharge:  Complete the course of antibiotics with probiotics as directed. As needed pain medicines prescribed. Continue previous medicines. Follow-up with [facility] outpatient.  Brief narrative: [name] is a 71 y.o. female with PMH significant for PAD s/p right femoral stent in March 2023 by Dr. Unk Lightning, HTN, HLD, arthritis, breast cancer, GERD, gastric mass, history of idiopathic neuropathy, vitamin D deficiency, insomnia, depression. She had a toe ulceration on the right foot for last several weeks and was treated by her PCP for amoxicillin.   5/16, patient was seen at podiatrist office for follow-up.  She was noted to have a dry gangrene of her third right toe.  She also had evidence of cellulitis up to the leg and hence sent to the ED for evaluation. In the ED, she had a temperature of 99.1, WBC count of 11.1 X-ray of the foot did not show any evidence of acute bony abnormality but showed soft tissue swelling of the third toe and dorsum of the foot Admitted to hospitalist service.  Patient was started on IV vancomycin. Podiatry and vascular surgery consultation was called. 5/17, MRI of the foot showed soft tissue ulceration at the tip of the third toe.  No evidence of osteomyelitis or abscess.    Subjective: Patient was seen and examined this morning.  Presented Ziclocin subjective lying down in bed.  Not in distress.  Son at bedside.  Patient is concerned about the wheezing coming out of her right leg today.   Principal Problem:   Gangrene of toe of right foot ([place]) Active Problems:   PAD (peripheral artery disease) (HCC)   Idiopathic neuropathy   Vitamin D deficiency   Insomnia   Depression    Hospital course: Gangrene of toe  of right foot -Patient had ulceration of the right toes did not respond to outpatient antibiotic treatment.   -Clinically with cellulitis and gangrene of the toes. X-ray and MRI did not show any evidence of osteomyelitis.   -Podiatry and vascular surgery consulted.  Underwent CT angio of chest abdomen pelvis today which did not show any evidence of vascular stenosis requiring urgent intervention. -Currently clinically improving on IV vancomycin.  Per recommendation by podiatry, will discharge her on oral Bactrim for 2 weeks with probiotics and pain control with tramadol   PAD (peripheral artery disease)  Hyperlipidemia -s/p prior right femoral stent in 08/2021.  -Continue asa, plavix and statin -Vascular surgery consulted  Essential hypertension -Continue losartan and HCTZ   Depression -Stable.    Insomnia -Continue elavil.   Vitamin D deficiency -Continue vitamin D.   Idiopathic neuropathy -Continue neurontin.  Goals of care   Code Status: Full Code    Mobility: May need PT evaluation after procedure  Skin assessment:     Nutritional status:  Body mass index is 26.17 kg/m.           Wounds:  - Wound / Incision (Open or Dehisced) 10/20/21 Other (Comment) Toe (Comment  which one) Anterior;Right (Active)  Date First Assessed/Time First Assessed: 10/20/21 2200   Wound Type: Other (Comment)  Location: (c) Toe (Comment  which one)  Location Orientation: Anterior;Right  Present on Admission: Yes    Assessments 10/20/2021 10:16 PM 10/21/2021  9:49 PM  Wound Image        Dressing Type  None None  Site / Wound Assessment Black;Red Red;Black  Drainage Amount None None     No Linked orders to display    Discharge Exam:   Vitals:   10/22/21 1946 10/23/21 0359 10/23/21 0439 10/23/21 0802  BP: (!) 152/70 (!) 167/90 (!) 173/86 129/75  Pulse: 93 98 89 93  Resp: '18 20 17 19  '$ Temp: 98.3 F (36.8 C) 98.4 F (36.9 C) 98.3 F (36.8 C) 98.2 F (36.8 C)  TempSrc: Oral  Oral Oral Oral  SpO2: 99% 100% 100% 100%  Weight:      Height:        Body mass index is 26.17 kg/m.  General exam: Pleasant, elderly Caucasian female.  Not in physical distress Skin: No rashes, lesions or ulcers. HEENT: Atraumatic, normocephalic, no obvious bleeding Lungs: Clear to auscultation bilaterally CVS: Regular rate and rhythm, no murmur GI/Abd soft, nontender, nondistended, bowel sound present CNS: Alert, awake, oriented x3 Psychiatry: Mood appropriate Extremities: Right leg cellulitis improving.  Right second and third toe with gangrenous changes  Follow ups:    Follow-up Information     [name], MD Follow up.   Specialty: Internal Medicine Contact information: [address] [place] [address] [phone]                 Discharge Instructions:   Discharge Instructions     Call MD for:  difficulty breathing, headache or visual disturbances   Complete by: As directed    Call MD for:  extreme fatigue   Complete by: As directed    Call MD for:  hives   Complete by: As directed    Call MD for:  persistant dizziness or light-headedness   Complete by: As directed    Call MD for:  persistant nausea and vomiting   Complete by: As directed    Call MD for:  severe uncontrolled pain   Complete by: As directed    Call MD for:  temperature >100.4   Complete by: As directed    Diet general   Complete by: As directed    Discharge instructions   Complete by: As directed    Recommendations at discharge:   Complete the course of antibiotics with probiotics as directed.  As needed pain medicines prescribed.  Continue previous medicines.  Follow-up with [facility] outpatient.  General discharge instructions: Follow with Primary MD [name], MD in 7 days  Please request your PCP  to go over your hospital tests, procedures, radiology results at the follow up. Please get your medicines reviewed and adjusted.  Your PCP may decide to repeat  certain labs or tests as needed. Do not drive, operate heavy machinery, perform activities at heights, swimming or participation in water activities or provide baby sitting services if your were admitted for syncope or siezures until you have seen by Primary MD or a Neurologist and advised to do so again. [place] Controlled Substance Reporting System database was reviewed. Do not drive, operate heavy machinery, perform activities at heights, swim, participate in water activities or provide baby-sitting services while on medications for pain, sleep and mood until your outpatient physician has reevaluated you and advised to do so again.  You are strongly recommended to comply with the dose, frequency and duration of prescribed medications. Activity: As tolerated with Full fall precautions use walker/cane & assistance as needed Avoid using any recreational substances like cigarette, tobacco, alcohol, or non-prescribed drug. If you experience worsening of your admission symptoms,  develop shortness of breath, life threatening emergency, suicidal or homicidal thoughts you must seek medical attention immediately by calling 911 or calling your MD immediately  if symptoms less severe. You must read complete instructions/literature along with all the possible adverse reactions/side effects for all the medicines you take and that have been prescribed to you. Take any new medicine only after you have completely understood and accepted all the possible adverse reactions/side effects.  Wear Seat belts while driving. You were cared for by a hospitalist during your hospital stay. If you have any questions about your discharge medications or the care you received while you were in the hospital after you are discharged, you can call the unit and ask to speak with the hospitalist or the covering physician. Once you are discharged, your primary care physician will handle any further medical issues. Please note that NO  REFILLS for any discharge medications will be authorized once you are discharged, as it is imperative that you return to your primary care physician (or establish a relationship with a primary care physician if you do not have one).   Discharge wound care:   Complete by: As directed    Increase activity slowly   Complete by: As directed        Discharge Medications:   Allergies as of 10/23/2021       Reactions   Latex Itching   Levofloxacin    Possible tendon rupture   Morphine And Related Nausea Only        Medication List     STOP taking these medications    amoxicillin-clavulanate 875-125 MG tablet Commonly known as: Augmentin       TAKE these medications    amitriptyline 75 MG tablet Commonly known as: ELAVIL Take 75 mg by mouth at bedtime.   aspirin EC 81 MG tablet Take 81 mg by mouth daily. Swallow whole.   atorvastatin 20 MG tablet Commonly known as: LIPITOR Take 20 mg by mouth at bedtime.   calcium carbonate 600 MG Tabs tablet Commonly known as: OS-CAL Take 600 mg by mouth 2 (two) times daily with a meal. Vitamin D 400 mg   celecoxib 200 MG capsule Commonly known as: CELEBREX Take 200 mg by mouth 2 (two) times daily.   clopidogrel 75 MG tablet Commonly known as: Plavix Take 1 tablet (75 mg total) by mouth daily. What changed: when to take this   COQ10 PO Take 200 mg by mouth daily.   fluticasone 50 MCG/ACT nasal spray Commonly known as: FLONASE Place 1 spray into both nostrils daily as needed for allergies or rhinitis.   furosemide 40 MG tablet Commonly known as: LASIX Take 40 mg by mouth 2 (two) times daily as needed for fluid or edema.   gabapentin 600 MG tablet Commonly known as: NEURONTIN Take 600 mg by mouth 3 (three) times daily.   losartan-hydrochlorothiazide 100-25 MG tablet Commonly known as: HYZAAR Take 1 tablet by mouth daily.   Lysine HCl 1000 MG Tabs Take 1,000 mg by mouth daily as needed (wound healing).   MAGNESIUM  PO Take 400 mg by mouth at bedtime.   montelukast 10 MG tablet Commonly known as: SINGULAIR Take 10 mg by mouth daily.   multivitamin tablet Take 1 tablet by mouth daily.   mupirocin ointment 2 % Commonly known as: BACTROBAN Apply 1 application. topically daily.   neomycin-bacitracin-polymyxin 400-10-4998 ointment Commonly known as: NEOSPORIN Apply 1 application. topically daily as needed for wound care.   pantoprazole 40  MG tablet Commonly known as: PROTONIX Take 40 mg by mouth daily.   raloxifene 60 MG tablet Commonly known as: EVISTA Take 60 mg by mouth daily.   saccharomyces boulardii 250 MG capsule Commonly known as: FLORASTOR Take 1 capsule (250 mg total) by mouth 2 (two) times daily for 14 days.   sulfamethoxazole-trimethoprim 800-160 MG tablet Commonly known as: BACTRIM DS Take 1 tablet by mouth 2 (two) times daily for 14 days.   traMADol 50 MG tablet Commonly known as: Ultram Take 1 tablet (50 mg total) by mouth every 6 (six) hours as needed for up to 5 days.   VITAMIN D3 PO Take 5,000 Units by mouth at bedtime.               Discharge Care Instructions  (From admission, onward)           Start     Ordered   10/23/21 0000  Discharge wound care:        10/23/21 1057             The results of significant diagnostics from this hospitalization (including imaging, microbiology, ancillary and laboratory) are listed below for reference.    Procedures and Diagnostic Studies:   DG Tibia/Fibula Right  Result Date: 10/19/2021 CLINICAL DATA:  Gangrene of the second and third toes. Pain and swelling. EXAM: RIGHT FOOT COMPLETE - 3+ VIEW; RIGHT TIBIA AND FIBULA - 2 VIEW COMPARISON:  None. FINDINGS: There is soft tissue swelling over the dorsum of the foot as well as the third toe. There is no evidence for acute fracture or dislocation. There are no cortical erosions. There is no focal osteopenia. There is a small nonspecific soft tissue calcification  in the mid third toe laterally. Joint spaces are well maintained. IMPRESSION: *No acute bony abnormality. *Soft tissue swelling of the third toe and dorsum of the foot. Electronically Signed   By: Ronney Asters M.D.   On: 10/19/2021 22:07   MR FOOT RIGHT WO CONTRAST  Result Date: 10/20/2021 CLINICAL DATA:  Right second and third toe dry gangrene. EXAM: MRI OF THE RIGHT FOREFOOT WITHOUT CONTRAST TECHNIQUE: Multiplanar, multisequence MR imaging of the right forefoot was performed. No intravenous contrast was administered. COMPARISON:  Right foot x-rays from yesterday. FINDINGS: Bones/Joint/Cartilage No marrow signal abnormality. No fracture or dislocation. Joint spaces are preserved. No joint effusion. Ligaments Collateral ligaments are intact. Muscles and Tendons Flexor and extensor tendons are intact. Soft tissue Soft tissue ulceration at the tip of the third toe. Diffuse soft tissue swelling of the forefoot. No fluid collection or hematoma. No soft tissue mass. IMPRESSION: 1. Soft tissue ulceration at the tip of the third toe. No evidence of osteomyelitis or abscess. Electronically Signed   By: Titus Dubin M.D.   On: 10/20/2021 08:52   DG Foot Complete Right  Result Date: 10/19/2021 CLINICAL DATA:  Gangrene of the second and third toes. Pain and swelling. EXAM: RIGHT FOOT COMPLETE - 3+ VIEW; RIGHT TIBIA AND FIBULA - 2 VIEW COMPARISON:  None. FINDINGS: There is soft tissue swelling over the dorsum of the foot as well as the third toe. There is no evidence for acute fracture or dislocation. There are no cortical erosions. There is no focal osteopenia. There is a small nonspecific soft tissue calcification in the mid third toe laterally. Joint spaces are well maintained. IMPRESSION: *No acute bony abnormality. *Soft tissue swelling of the third toe and dorsum of the foot. Electronically Signed   By: Warren Lacy  Dagoberto Reef M.D.   On: 10/19/2021 22:07   VAS [place] ABI WITH/WO TBI  Result Date: 10/20/2021  LOWER  EXTREMITY DOPPLER STUDY Patient Name:  Chelsye Suhre  Date of Exam:   10/20/2021 Medical Rec #: [id number]           Accession #:    [id number] Date of Birth: 19-Feb-1951           Patient Gender: F Patient Age:   74 years Exam Location:  [facility] Procedure:      VAS [place] ABI WITH/WO TBI Referring Phys: ERIC CHEN --------------------------------------------------------------------------------  Indications: Gangrene, and peripheral artery disease. High Risk Factors: Hypertension, hyperlipidemia, no history of smoking.  Vascular Interventions: Right SFA stent 08/2021. Comparison Study: 07/23/2021- noncompressible ABI bilaterally Performing Technologist: Maudry Mayhew MHA, RVT, RDCS, RDMS  Examination Guidelines: A complete evaluation includes at minimum, Doppler waveform signals and systolic blood pressure reading at the level of bilateral brachial, anterior tibial, and posterior tibial arteries, when vessel segments are accessible. Bilateral testing is considered an integral part of a complete examination. Photoelectric Plethysmograph (PPG) waveforms and toe systolic pressure readings are included as required and additional duplex testing as needed. Limited examinations for reoccurring indications may be performed as noted.  ABI Findings: +---------+------------------+-----+----------+--------------------+ Right    Rt Pressure (mmHg)IndexWaveform  Comment              +---------+------------------+-----+----------+--------------------+ Brachial                        triphasic Restricted extremity +---------+------------------+-----+----------+--------------------+ PTA      187               1.09 monophasic                     +---------+------------------+-----+----------+--------------------+ DP       158               0.92 monophasic                     +---------+------------------+-----+----------+--------------------+ Great Toe77                0.45                                 +---------+------------------+-----+----------+--------------------+ +---------+------------------+-----+----------+-------+ Left     Lt Pressure (mmHg)IndexWaveform  Comment +---------+------------------+-----+----------+-------+ Brachial 172                    triphasic         +---------+------------------+-----+----------+-------+ PTA      181               1.05 monophasic        +---------+------------------+-----+----------+-------+ DP       166               0.97 triphasic         +---------+------------------+-----+----------+-------+ Great Toe21                0.12                   +---------+------------------+-----+----------+-------+ +-------+-----------+-----------+------------+------------+ ABI/TBIToday's ABIToday's TBIPrevious ABIPrevious TBI +-------+-----------+-----------+------------+------------+ Right  1.09       0.45       1.48        0.57         +-------+-----------+-----------+------------+------------+ Left   1.05  0.12       1.39        0.55         +-------+-----------+-----------+------------+------------+  Summary: Right: Resting right ankle-brachial index is within normal range, however given previous noncompressible ABI and abnormal waveforms, this is likely falsely elevated. The right toe-brachial index is abnormal. Left: Resting left ankle-brachial index is within normal range. No evidence of significant left lower extremity arterial disease. The left toe-brachial index is abnormal. *See table(s) above for measurements and observations.  Electronically signed by Jamelle Haring on 10/20/2021 at 9:14:02 PM.    Final    VAS [place] LOWER EXTREMITY ARTERIAL DUPLEX  Result Date: 10/20/2021 LOWER EXTREMITY ARTERIAL DUPLEX STUDY Patient Name:  ONEDIA VARGUS Engelstad  Date of Exam:   10/20/2021 Medical Rec #: [id number]           Accession #:    [id number] Date of Birth: February 17, 1951           Patient Gender: F Patient Age:   72 years  Exam Location:  [facility] Procedure:      VAS [place] LOWER EXTREMITY ARTERIAL DUPLEX Referring Phys: ERIC CHEN --------------------------------------------------------------------------------  Indications: Ulceration, gangrene, peripheral artery disease, and Right SFA              stent. High Risk Factors: Hypertension, hyperlipidemia, no history of smoking.  Vascular               Right SFA stent 08/2021. Interventions: Current ABI:           ABI/TBI- right=1.09 (falsely elevated)/0.45,                        left=1.05/0.12 Comparison Study: No prior study Performing Technologist: Maudry Mayhew MHA, RDMS, RVT, RDCS  Examination Guidelines: A complete evaluation includes B-mode imaging, spectral Doppler, color Doppler, and power Doppler as needed of all accessible portions of each vessel. Bilateral testing is considered an integral part of a complete examination. Limited examinations for reoccurring indications may be performed as noted.  +-----------+--------+-----+--------+----------+--------+ RIGHT      PSV cm/sRatioStenosisWaveform  Comments +-----------+--------+-----+--------+----------+--------+ CFA Mid    181                  monophasic         +-----------+--------+-----+--------+----------+--------+ DFA        67                   monophasic         +-----------+--------+-----+--------+----------+--------+ SFA Prox   226                  monophasic         +-----------+--------+-----+--------+----------+--------+ SFA Mid    213                  monophasic         +-----------+--------+-----+--------+----------+--------+ SFA Distal 180                  monophasic         +-----------+--------+-----+--------+----------+--------+ POP Prox   220                  monophasic         +-----------+--------+-----+--------+----------+--------+ POP Distal 179                  monophasic         +-----------+--------+-----+--------+----------+--------+  TP Trunk   168  monophasic         +-----------+--------+-----+--------+----------+--------+ ATA Distal 123                  monophasic         +-----------+--------+-----+--------+----------+--------+ PTA Distal 86                   monophasic         +-----------+--------+-----+--------+----------+--------+ PERO Distal108                  monophasic         +-----------+--------+-----+--------+----------+--------+  Right Stent(s): +---------------+--------+--------+----------+--------+ Prox SFA       PSV cm/sStenosisWaveform  Comments +---------------+--------+--------+----------+--------+ Prox to Stent  226             monophasic         +---------------+--------+--------+----------+--------+ Proximal Stent 188             monophasic         +---------------+--------+--------+----------+--------+ Mid Stent      191             monophasic         +---------------+--------+--------+----------+--------+ Distal Stent   218             monophasic         +---------------+--------+--------+----------+--------+ Distal to [id number]             monophasic         +---------------+--------+--------+----------+--------+    Summary: Right: Patent stent with 1-49% stenosis in the right proximal superficial femoral artery.  See table(s) above for measurements and observations. Electronically signed by Jamelle Haring on 10/20/2021 at 9:14:48 PM.    Final      Labs:   Basic Metabolic Panel: Recent Labs  Lab 10/19/21 1342 10/20/21 0242 10/21/21 0055  NA 134* 134* 133*  K 3.3* 4.1 4.1  CL 95* 101 102  CO2 '28 26 24  '$ GLUCOSE 119* 130* 159*  BUN '17 16 14  '$ CREATININE 0.85 0.75 0.89  CALCIUM 9.3 8.9 8.6*  MG  --  1.7  --    GFR Estimated Creatinine Clearance: 53.3 mL/min (by C-G formula based on SCr of 0.89 mg/dL). Liver Function Tests: Recent Labs  Lab 10/20/21 0242  AST 18  ALT 16  ALKPHOS 92  BILITOT 0.8  PROT 5.5*  ALBUMIN  2.7*   No results for input(s): LIPASE, AMYLASE in the last 168 hours. No results for input(s): AMMONIA in the last 168 hours. Coagulation profile No results for input(s): INR, PROTIME in the last 168 hours.  CBC: Recent Labs  Lab 10/19/21 1342 10/20/21 0242 10/21/21 0055  WBC 11.1* 10.4 10.0  NEUTROABS 8.2* 7.7 7.5  HGB 10.5* 8.9* 8.8*  HCT 33.4* 27.4* 26.8*  MCV 87.0 86.4 85.6  PLT 513* 423* 409*   Cardiac Enzymes: No results for input(s): CKTOTAL, CKMB, CKMBINDEX, TROPONINI in the last 168 hours. BNP: Invalid input(s): POCBNP CBG: No results for input(s): GLUCAP in the last 168 hours. D-Dimer No results for input(s): DDIMER in the last 72 hours. Hgb A1c No results for input(s): HGBA1C in the last 72 hours. Lipid Profile No results for input(s): CHOL, HDL, LDLCALC, TRIG, CHOLHDL, LDLDIRECT in the last 72 hours. Thyroid function studies No results for input(s): TSH, T4TOTAL, T3FREE, THYROIDAB in the last 72 hours.  Invalid input(s): FREET3 Anemia work up No results for input(s): VITAMINB12, FOLATE, FERRITIN, TIBC, IRON, RETICCTPCT in the last 72 hours. Microbiology Recent Results (from the past 240  hour(s))  Aerobic Culture w Gram Stain (superficial specimen)     Status: None (Preliminary result)   Collection Time: 10/19/21  9:33 PM   Specimen: Wound  Result Value Ref Range Status   Specimen Description WOUND  Final   Special Requests RIGHT TOE  Final   Gram Stain NO WBC SEEN NO ORGANISMS SEEN   Final   Culture   Final    RARE STAPHYLOCOCCUS EPIDERMIDIS SUSCEPTIBILITIES TO FOLLOW Performed at [facility], [address] [place], [place] [address]    Report Status PENDING  Incomplete  Blood culture (routine x 2)     Status: None (Preliminary result)   Collection Time: 10/19/21 10:10 PM   Specimen: BLOOD LEFT FOREARM  Result Value Ref Range Status   Specimen Description BLOOD LEFT FOREARM  Final   Special Requests   Final    BOTTLES DRAWN AEROBIC  AND ANAEROBIC Blood Culture adequate volume   Culture   Final    NO GROWTH 4 DAYS Performed at [facility], [place] [address] [place], [place] [address]    Report Status PENDING  Incomplete  Blood culture (routine x 2)     Status: None (Preliminary result)   Collection Time: 10/19/21 10:20 PM   Specimen: BLOOD  Result Value Ref Range Status   Specimen Description BLOOD LEFT ANTECUBITAL  Final   Special Requests   Final    BOTTLES DRAWN AEROBIC AND ANAEROBIC Blood Culture adequate volume   Culture   Final    NO GROWTH 4 DAYS Performed at [facility], [place] [address], [place], [place] [address]    Report Status PENDING  Incomplete  Surgical pcr screen     Status: None   Collection Time: 10/20/21  8:57 PM   Specimen: Nasal Mucosa; Nasal Swab  Result Value Ref Range Status   MRSA, PCR NEGATIVE NEGATIVE Final   Staphylococcus aureus NEGATIVE NEGATIVE Final    Comment: (NOTE) The Xpert SA Assay (FDA approved for NASAL specimens in patients 64 years of age and older), is one component of a comprehensive surveillance program. It is not intended to diagnose infection nor to guide or monitor treatment. Performed at [facility], [place] [address] [place], [place] [address]     Time coordinating discharge: 35 minutes  Signed: Namita Yearwood  [facility] 10/23/2021, 10:57 AM

## 2021-10-23 NOTE — Plan of Care (Signed)
  Problem: Nutrition: Goal: Adequate nutrition will be maintained Outcome: Progressing   Problem: Pain Managment: Goal: General experience of comfort will improve Outcome: Progressing   Problem: Safety: Goal: Ability to remain free from injury will improve Outcome: Progressing   

## 2021-10-24 LAB — CULTURE, BLOOD (ROUTINE X 2)
Culture: NO GROWTH
Culture: NO GROWTH
Special Requests: ADEQUATE
Special Requests: ADEQUATE

## 2021-10-26 DIAGNOSIS — Z961 Presence of intraocular lens: Secondary | ICD-10-CM | POA: Diagnosis not present

## 2021-10-26 DIAGNOSIS — H43823 Vitreomacular adhesion, bilateral: Secondary | ICD-10-CM | POA: Diagnosis not present

## 2021-10-27 DIAGNOSIS — R519 Headache, unspecified: Secondary | ICD-10-CM | POA: Diagnosis not present

## 2021-10-27 DIAGNOSIS — I96 Gangrene, not elsewhere classified: Secondary | ICD-10-CM | POA: Diagnosis not present

## 2021-10-27 DIAGNOSIS — C50919 Malignant neoplasm of unspecified site of unspecified female breast: Secondary | ICD-10-CM | POA: Diagnosis not present

## 2021-10-27 DIAGNOSIS — J309 Allergic rhinitis, unspecified: Secondary | ICD-10-CM | POA: Diagnosis not present

## 2021-10-27 DIAGNOSIS — G4700 Insomnia, unspecified: Secondary | ICD-10-CM | POA: Diagnosis not present

## 2021-10-27 DIAGNOSIS — K219 Gastro-esophageal reflux disease without esophagitis: Secondary | ICD-10-CM | POA: Diagnosis not present

## 2021-10-27 DIAGNOSIS — M1990 Unspecified osteoarthritis, unspecified site: Secondary | ICD-10-CM | POA: Diagnosis not present

## 2021-10-27 DIAGNOSIS — R7303 Prediabetes: Secondary | ICD-10-CM | POA: Diagnosis not present

## 2021-10-27 DIAGNOSIS — I10 Essential (primary) hypertension: Secondary | ICD-10-CM | POA: Diagnosis not present

## 2021-11-02 DIAGNOSIS — R7303 Prediabetes: Secondary | ICD-10-CM | POA: Diagnosis not present

## 2021-11-02 DIAGNOSIS — K219 Gastro-esophageal reflux disease without esophagitis: Secondary | ICD-10-CM | POA: Diagnosis not present

## 2021-11-02 DIAGNOSIS — J309 Allergic rhinitis, unspecified: Secondary | ICD-10-CM | POA: Diagnosis not present

## 2021-11-02 DIAGNOSIS — C50919 Malignant neoplasm of unspecified site of unspecified female breast: Secondary | ICD-10-CM | POA: Diagnosis not present

## 2021-11-02 DIAGNOSIS — I96 Gangrene, not elsewhere classified: Secondary | ICD-10-CM | POA: Diagnosis not present

## 2021-11-02 DIAGNOSIS — R197 Diarrhea, unspecified: Secondary | ICD-10-CM | POA: Diagnosis not present

## 2021-11-02 DIAGNOSIS — I10 Essential (primary) hypertension: Secondary | ICD-10-CM | POA: Diagnosis not present

## 2021-11-02 DIAGNOSIS — M1990 Unspecified osteoarthritis, unspecified site: Secondary | ICD-10-CM | POA: Diagnosis not present

## 2021-11-02 DIAGNOSIS — R112 Nausea with vomiting, unspecified: Secondary | ICD-10-CM | POA: Diagnosis not present

## 2021-11-05 DIAGNOSIS — D649 Anemia, unspecified: Secondary | ICD-10-CM | POA: Diagnosis not present

## 2021-11-05 DIAGNOSIS — I70211 Atherosclerosis of native arteries of extremities with intermittent claudication, right leg: Secondary | ICD-10-CM | POA: Diagnosis not present

## 2021-11-05 DIAGNOSIS — K441 Diaphragmatic hernia with gangrene: Secondary | ICD-10-CM | POA: Diagnosis not present

## 2021-11-05 DIAGNOSIS — R531 Weakness: Secondary | ICD-10-CM | POA: Diagnosis not present

## 2021-11-05 DIAGNOSIS — E871 Hypo-osmolality and hyponatremia: Secondary | ICD-10-CM | POA: Diagnosis not present

## 2021-11-05 DIAGNOSIS — E860 Dehydration: Secondary | ICD-10-CM | POA: Diagnosis not present

## 2021-11-05 DIAGNOSIS — Z20822 Contact with and (suspected) exposure to covid-19: Secondary | ICD-10-CM | POA: Diagnosis not present

## 2021-11-05 DIAGNOSIS — E876 Hypokalemia: Secondary | ICD-10-CM | POA: Diagnosis not present

## 2021-11-05 DIAGNOSIS — K219 Gastro-esophageal reflux disease without esophagitis: Secondary | ICD-10-CM | POA: Diagnosis not present

## 2021-11-05 DIAGNOSIS — N179 Acute kidney failure, unspecified: Secondary | ICD-10-CM | POA: Diagnosis not present

## 2021-11-05 DIAGNOSIS — L03115 Cellulitis of right lower limb: Secondary | ICD-10-CM | POA: Diagnosis not present

## 2021-11-06 DIAGNOSIS — N179 Acute kidney failure, unspecified: Secondary | ICD-10-CM | POA: Insufficient documentation

## 2021-11-06 DIAGNOSIS — K441 Diaphragmatic hernia with gangrene: Secondary | ICD-10-CM | POA: Diagnosis not present

## 2021-11-06 DIAGNOSIS — I70211 Atherosclerosis of native arteries of extremities with intermittent claudication, right leg: Secondary | ICD-10-CM | POA: Diagnosis not present

## 2021-11-06 DIAGNOSIS — K219 Gastro-esophageal reflux disease without esophagitis: Secondary | ICD-10-CM | POA: Insufficient documentation

## 2021-11-06 DIAGNOSIS — E860 Dehydration: Secondary | ICD-10-CM | POA: Diagnosis not present

## 2021-11-06 DIAGNOSIS — Z20822 Contact with and (suspected) exposure to covid-19: Secondary | ICD-10-CM | POA: Diagnosis not present

## 2021-11-06 DIAGNOSIS — D649 Anemia, unspecified: Secondary | ICD-10-CM | POA: Insufficient documentation

## 2021-11-06 DIAGNOSIS — E785 Hyperlipidemia, unspecified: Secondary | ICD-10-CM | POA: Insufficient documentation

## 2021-11-06 DIAGNOSIS — L03115 Cellulitis of right lower limb: Secondary | ICD-10-CM | POA: Diagnosis not present

## 2021-11-06 DIAGNOSIS — E871 Hypo-osmolality and hyponatremia: Secondary | ICD-10-CM | POA: Diagnosis not present

## 2021-11-06 DIAGNOSIS — K449 Diaphragmatic hernia without obstruction or gangrene: Secondary | ICD-10-CM | POA: Diagnosis not present

## 2021-11-06 DIAGNOSIS — L97519 Non-pressure chronic ulcer of other part of right foot with unspecified severity: Secondary | ICD-10-CM | POA: Diagnosis not present

## 2021-11-06 DIAGNOSIS — R531 Weakness: Secondary | ICD-10-CM | POA: Diagnosis not present

## 2021-11-06 DIAGNOSIS — I96 Gangrene, not elsewhere classified: Secondary | ICD-10-CM | POA: Diagnosis not present

## 2021-11-07 DIAGNOSIS — L03115 Cellulitis of right lower limb: Secondary | ICD-10-CM | POA: Diagnosis not present

## 2021-11-08 ENCOUNTER — Ambulatory Visit: Payer: Medicare HMO | Admitting: Podiatry

## 2021-11-08 DIAGNOSIS — E871 Hypo-osmolality and hyponatremia: Secondary | ICD-10-CM | POA: Diagnosis not present

## 2021-11-08 DIAGNOSIS — I96 Gangrene, not elsewhere classified: Secondary | ICD-10-CM | POA: Diagnosis not present

## 2021-11-08 DIAGNOSIS — N179 Acute kidney failure, unspecified: Secondary | ICD-10-CM | POA: Diagnosis not present

## 2021-11-08 DIAGNOSIS — L03115 Cellulitis of right lower limb: Secondary | ICD-10-CM | POA: Diagnosis not present

## 2021-11-09 ENCOUNTER — Inpatient Hospital Stay (HOSPITAL_COMMUNITY)
Admission: AD | Admit: 2021-11-09 | Discharge: 2021-11-12 | DRG: 256 | Disposition: A | Discharge status: Home / Self Care | Payer: Medicare HMO | Source: Other Acute Inpatient Hospital | Attending: Student | Admitting: Student

## 2021-11-09 DIAGNOSIS — J45909 Unspecified asthma, uncomplicated: Secondary | ICD-10-CM | POA: Diagnosis present

## 2021-11-09 DIAGNOSIS — E878 Other disorders of electrolyte and fluid balance, not elsewhere classified: Secondary | ICD-10-CM | POA: Diagnosis not present

## 2021-11-09 DIAGNOSIS — Z95820 Peripheral vascular angioplasty status with implants and grafts: Secondary | ICD-10-CM

## 2021-11-09 DIAGNOSIS — Z7982 Long term (current) use of aspirin: Secondary | ICD-10-CM | POA: Diagnosis not present

## 2021-11-09 DIAGNOSIS — Z853 Personal history of malignant neoplasm of breast: Secondary | ICD-10-CM

## 2021-11-09 DIAGNOSIS — I10 Essential (primary) hypertension: Secondary | ICD-10-CM

## 2021-11-09 DIAGNOSIS — L97519 Non-pressure chronic ulcer of other part of right foot with unspecified severity: Secondary | ICD-10-CM | POA: Diagnosis not present

## 2021-11-09 DIAGNOSIS — I70261 Atherosclerosis of native arteries of extremities with gangrene, right leg: Secondary | ICD-10-CM | POA: Diagnosis not present

## 2021-11-09 DIAGNOSIS — G4700 Insomnia, unspecified: Secondary | ICD-10-CM | POA: Diagnosis not present

## 2021-11-09 DIAGNOSIS — D509 Iron deficiency anemia, unspecified: Secondary | ICD-10-CM | POA: Insufficient documentation

## 2021-11-09 DIAGNOSIS — Z881 Allergy status to other antibiotic agents status: Secondary | ICD-10-CM

## 2021-11-09 DIAGNOSIS — L97919 Non-pressure chronic ulcer of unspecified part of right lower leg with unspecified severity: Secondary | ICD-10-CM | POA: Diagnosis not present

## 2021-11-09 DIAGNOSIS — F5104 Psychophysiologic insomnia: Secondary | ICD-10-CM | POA: Diagnosis present

## 2021-11-09 DIAGNOSIS — I96 Gangrene, not elsewhere classified: Secondary | ICD-10-CM | POA: Diagnosis not present

## 2021-11-09 DIAGNOSIS — I739 Peripheral vascular disease, unspecified: Secondary | ICD-10-CM | POA: Diagnosis not present

## 2021-11-09 DIAGNOSIS — Z79899 Other long term (current) drug therapy: Secondary | ICD-10-CM

## 2021-11-09 DIAGNOSIS — L97921 Non-pressure chronic ulcer of unspecified part of left lower leg limited to breakdown of skin: Secondary | ICD-10-CM

## 2021-11-09 DIAGNOSIS — N179 Acute kidney failure, unspecified: Secondary | ICD-10-CM | POA: Diagnosis not present

## 2021-11-09 DIAGNOSIS — Z90710 Acquired absence of both cervix and uterus: Secondary | ICD-10-CM

## 2021-11-09 DIAGNOSIS — L97912 Non-pressure chronic ulcer of unspecified part of right lower leg with fat layer exposed: Secondary | ICD-10-CM | POA: Diagnosis not present

## 2021-11-09 DIAGNOSIS — E871 Hypo-osmolality and hyponatremia: Secondary | ICD-10-CM | POA: Diagnosis not present

## 2021-11-09 DIAGNOSIS — K219 Gastro-esophageal reflux disease without esophagitis: Secondary | ICD-10-CM | POA: Diagnosis present

## 2021-11-09 DIAGNOSIS — Z91040 Latex allergy status: Secondary | ICD-10-CM | POA: Diagnosis not present

## 2021-11-09 DIAGNOSIS — L03115 Cellulitis of right lower limb: Secondary | ICD-10-CM | POA: Diagnosis not present

## 2021-11-09 DIAGNOSIS — R609 Edema, unspecified: Secondary | ICD-10-CM | POA: Diagnosis present

## 2021-11-09 DIAGNOSIS — G609 Hereditary and idiopathic neuropathy, unspecified: Secondary | ICD-10-CM | POA: Diagnosis not present

## 2021-11-09 DIAGNOSIS — F32A Depression, unspecified: Secondary | ICD-10-CM | POA: Diagnosis not present

## 2021-11-09 DIAGNOSIS — Z885 Allergy status to narcotic agent status: Secondary | ICD-10-CM

## 2021-11-09 DIAGNOSIS — F419 Anxiety disorder, unspecified: Secondary | ICD-10-CM | POA: Diagnosis not present

## 2021-11-09 DIAGNOSIS — L03031 Cellulitis of right toe: Secondary | ICD-10-CM | POA: Diagnosis not present

## 2021-11-09 DIAGNOSIS — D649 Anemia, unspecified: Secondary | ICD-10-CM

## 2021-11-09 DIAGNOSIS — Z7902 Long term (current) use of antithrombotics/antiplatelets: Secondary | ICD-10-CM | POA: Diagnosis not present

## 2021-11-09 DIAGNOSIS — E861 Hypovolemia: Secondary | ICD-10-CM | POA: Diagnosis present

## 2021-11-09 DIAGNOSIS — E559 Vitamin D deficiency, unspecified: Secondary | ICD-10-CM | POA: Diagnosis not present

## 2021-11-09 DIAGNOSIS — E785 Hyperlipidemia, unspecified: Secondary | ICD-10-CM | POA: Diagnosis present

## 2021-11-09 LAB — CBC WITH DIFFERENTIAL/PLATELET
Abs Immature Granulocytes: 0.08 10*3/uL — ABNORMAL HIGH (ref 0.00–0.07)
Basophils Absolute: 0.1 10*3/uL (ref 0.0–0.1)
Basophils Relative: 1 %
Eosinophils Absolute: 0.3 10*3/uL (ref 0.0–0.5)
Eosinophils Relative: 3 %
HCT: 29.6 % — ABNORMAL LOW (ref 36.0–46.0)
Hemoglobin: 9.3 g/dL — ABNORMAL LOW (ref 12.0–15.0)
Immature Granulocytes: 1 %
Lymphocytes Relative: 17 %
Lymphs Abs: 1.5 10*3/uL (ref 0.7–4.0)
MCH: 27.1 pg (ref 26.0–34.0)
MCHC: 31.4 g/dL (ref 30.0–36.0)
MCV: 86.3 fL (ref 80.0–100.0)
Monocytes Absolute: 0.8 10*3/uL (ref 0.1–1.0)
Monocytes Relative: 10 %
Neutro Abs: 5.9 10*3/uL (ref 1.7–7.7)
Neutrophils Relative %: 68 %
Platelets: 567 10*3/uL — ABNORMAL HIGH (ref 150–400)
RBC: 3.43 MIL/uL — ABNORMAL LOW (ref 3.87–5.11)
RDW: 17 % — ABNORMAL HIGH (ref 11.5–15.5)
WBC: 8.6 10*3/uL (ref 4.0–10.5)
nRBC: 0 % (ref 0.0–0.2)

## 2021-11-09 LAB — COMPREHENSIVE METABOLIC PANEL
ALT: 12 U/L (ref 0–44)
AST: 16 U/L (ref 15–41)
Albumin: 2.7 g/dL — ABNORMAL LOW (ref 3.5–5.0)
Alkaline Phosphatase: 109 U/L (ref 38–126)
Anion gap: 8 (ref 5–15)
BUN: 14 mg/dL (ref 8–23)
CO2: 26 mmol/L (ref 22–32)
Calcium: 8.8 mg/dL — ABNORMAL LOW (ref 8.9–10.3)
Chloride: 102 mmol/L (ref 98–111)
Creatinine, Ser: 0.83 mg/dL (ref 0.44–1.00)
GFR, Estimated: >60 mL/min (ref >60)
Glucose, Bld: 138 mg/dL — ABNORMAL HIGH (ref 70–99)
Potassium: 3.5 mmol/L (ref 3.5–5.1)
Sodium: 136 mmol/L (ref 135–145)
Total Bilirubin: 0.1 mg/dL — ABNORMAL LOW (ref 0.3–1.2)
Total Protein: 5.7 g/dL — ABNORMAL LOW (ref 6.5–8.1)

## 2021-11-09 LAB — PHOSPHORUS: Phosphorus: 3.1 mg/dL (ref 2.5–4.6)

## 2021-11-09 LAB — MAGNESIUM: Magnesium: 1.4 mg/dL — ABNORMAL LOW (ref 1.7–2.4)

## 2021-11-09 MED ORDER — MELATONIN 5 MG PO TABS
5.0000 mg | ORAL_TABLET | Freq: Every evening | ORAL | Status: DC | PRN
  Administered 2021-11-09 – 2021-11-10 (×2): 5 mg via ORAL
  Filled 2021-11-09 (×2): qty 1

## 2021-11-09 MED ORDER — LOSARTAN POTASSIUM 50 MG PO TABS
100.0000 mg | ORAL_TABLET | Freq: Every day | ORAL | Status: DC
Start: 2021-11-09 — End: 2021-11-12
  Administered 2021-11-09 – 2021-11-12 (×4): 100 mg via ORAL
  Filled 2021-11-09 (×4): qty 2

## 2021-11-09 MED ORDER — LACTATED RINGERS IV SOLN
INTRAVENOUS | Status: DC
Start: 2021-11-09 — End: 2021-11-10

## 2021-11-09 MED ORDER — ASPIRIN 81 MG PO TBEC
81.0000 mg | DELAYED_RELEASE_TABLET | Freq: Every day | ORAL | Status: DC
  Administered 2021-11-09 – 2021-11-12 (×3): 81 mg via ORAL
  Filled 2021-11-09 (×3): qty 1

## 2021-11-09 MED ORDER — POLYETHYLENE GLYCOL 3350 17 G PO PACK: 17.0000 g | PACK | Freq: Every day | ORAL | Status: DC | PRN

## 2021-11-09 MED ORDER — ATORVASTATIN CALCIUM 10 MG PO TABS
20.0000 mg | ORAL_TABLET | Freq: Every day | ORAL | Status: DC
Start: 2021-11-09 — End: 2021-11-10
  Administered 2021-11-09: 20 mg via ORAL
  Filled 2021-11-09: qty 2

## 2021-11-09 MED ORDER — SENNOSIDES-DOCUSATE SODIUM 8.6-50 MG PO TABS
2.0000 | ORAL_TABLET | Freq: Every day | ORAL | Status: DC
  Administered 2021-11-09 – 2021-11-10 (×2): 2 via ORAL
  Filled 2021-11-09 (×3): qty 2

## 2021-11-09 MED ORDER — RALOXIFENE HCL 60 MG PO TABS
60.0000 mg | ORAL_TABLET | Freq: Every day | ORAL | Status: DC
  Administered 2021-11-09 – 2021-11-12 (×3): 60 mg via ORAL
  Filled 2021-11-09 (×4): qty 1

## 2021-11-09 MED ORDER — PROCHLORPERAZINE EDISYLATE 10 MG/2ML IJ SOLN
10.0000 mg | Freq: Four times a day (QID) | INTRAMUSCULAR | Status: DC | PRN
  Filled 2021-11-09: qty 2

## 2021-11-09 MED ORDER — ACETAMINOPHEN 325 MG PO TABS
650.0000 mg | ORAL_TABLET | Freq: Four times a day (QID) | ORAL | Status: DC | PRN
  Administered 2021-11-10 – 2021-11-12 (×3): 650 mg via ORAL
  Filled 2021-11-09 (×3): qty 2

## 2021-11-09 MED ORDER — VANCOMYCIN HCL IN DEXTROSE 1-5 GM/200ML-% IV SOLN
1000.0000 mg | INTRAVENOUS | Status: DC
  Administered 2021-11-10 – 2021-11-11 (×2): 1000 mg via INTRAVENOUS
  Filled 2021-11-09 (×4): qty 200

## 2021-11-09 MED ORDER — HYDROMORPHONE HCL 1 MG/ML IJ SOLN: 0.5000 mg | INTRAMUSCULAR | Status: DC | PRN

## 2021-11-09 MED ORDER — CLOPIDOGREL BISULFATE 75 MG PO TABS
75.0000 mg | ORAL_TABLET | Freq: Every day | ORAL | Status: DC
  Administered 2021-11-09 – 2021-11-12 (×3): 75 mg via ORAL
  Filled 2021-11-09 (×3): qty 1

## 2021-11-09 MED ORDER — OXYCODONE HCL 5 MG PO TABS
5.0000 mg | ORAL_TABLET | ORAL | Status: DC | PRN
  Administered 2021-11-11 – 2021-11-12 (×2): 5 mg via ORAL
  Filled 2021-11-09 (×2): qty 1

## 2021-11-09 MED ORDER — ENOXAPARIN SODIUM 40 MG/0.4ML IJ SOSY
40.0000 mg | PREFILLED_SYRINGE | INTRAMUSCULAR | Status: DC
  Administered 2021-11-09 – 2021-11-11 (×3): 40 mg via SUBCUTANEOUS
  Filled 2021-11-09 (×3): qty 0.4

## 2021-11-09 MED ORDER — VANCOMYCIN HCL 1250 MG/250ML IV SOLN
1250.0000 mg | Freq: Once | INTRAVENOUS | Status: AC
  Administered 2021-11-09: 1250 mg via INTRAVENOUS
  Filled 2021-11-09: qty 250

## 2021-11-09 NOTE — Progress Notes (Signed)
Pharmacy Antibiotic Note  [name] is a 71 y.o. female admitted on 11/09/2021 with cellulitis.  Pharmacy has been consulted for Vancomycin dosing.  Plan: Vancomycin 1250 mg iv x 1 then 1 gram iv Q 24 (AUC of 495 using Scr 0.8)  Follow up progress, Scr, cultures  Weight: 63.5 kg (140 lb)  Temp (24hrs), Avg:98.4 F (36.9 C), Min:98.3 F (36.8 C), Max:98.4 F (36.9 C)  Recent Labs  Lab 11/09/21 2034  WBC 8.6  CREATININE 0.83    Estimated Creatinine Clearance: 55.7 mL/min (by C-G formula based on SCr of 0.83 mg/dL).    Allergies  Allergen Reactions   Latex Itching   Levofloxacin     Possible tendon rupture   Morphine And Related Nausea Only    Thank you for allowing pharmacy to be a part of this patient's care.  Tad Moore 11/09/2021 9:32 PM

## 2021-11-09 NOTE — H&P (Addendum)
History and Physical  [name] TMA:[id number] DOB: January 17, 1951 DOA: 11/09/2021  Referring physician: Accepted by Dr. Tamala Julian, [facility], hospitalist service.  Direct transfer from [facility]. PCP: [name], MD  Outpatient Specialists: Podiatry Patient coming from: [facility] as a direct transfer to [facility] telemetry surgical unit.  Chief Complaint: Right lower extremity cellulitis and right third toe dry gangrene.  HPI: [name] is a 71 y.o. female with medical history significant for peripheral artery disease status post right femoral stent in March 2023 by Dr. Unk Lightning, hypertension, hyperlipidemia, arthritis, history of breast cancer, GERD, gastric mass, history of idiopathic neuropathy, vitamin D deficiency, chronic insomnia, chronic anxiety/depression, who is admitted as a direct transfer from [facility] to [facility] telemetry surgical unit as inpatient status, due to right lower extremity cellulitis and right third toe dry gangrene.  The patient had a right third toe ulceration 6 months ago in December 2022 and has had several courses of IV and p.o. antibiotics without improvement.  Now with third right toe gangrene and right lower extremity cellulitis.  She was recently admitted at [facility] on 10/19/2021 and discharged on 10/23/2021 after treatment for gangrene of right toe.  During that admission she had x-ray and MRI that did not show evidence of osteomyelitis.  She was discharged to home.  The patient returned to [facility] with worsening symptoms.    Admitted to [facility] on 11/06/2021 with recurrent cellulitis of right lower extremity.  She received IV vancomycin after failing outpatient Bactrim with only minimal improvement.  ID was consulted at [facility] and recommended surgical consultation, therefore, the patient was transferred to [facility] for podiatry consultation.  At the time of this visit the patient is alert and oriented x4.  She  is tender with palpation of her right lower extremity, warmth, erythema and edema also noted, consistent with cellulitis.    ED Course: Direct transfer from [facility] to [facility].  Review of Systems: Review of systems as noted in the HPI. All other systems reviewed and are negative.   Past Medical History:  Diagnosis Date   Arthritis    joints   Asthma    allergic   Breast cancer ([place])    Gastric mass 05/24/2012   GERD (gastroesophageal reflux disease)    H/O hiatal hernia    Heart murmur    not MVP no treatment   Hyperlipidemia    Hypertension    Insomnia    Osteopenia    Osteoporosis    PONV (postoperative nausea and vomiting)    Rosacea    neck,cheeks   Past Surgical History:  Procedure Laterality Date   ABDOMINAL AORTOGRAM W/LOWER EXTREMITY Right 08/25/2021   Procedure: ABDOMINAL AORTOGRAM W/LOWER EXTREMITY;  Surgeon: Broadus John, MD;  Location: [facility];  Service: Cardiovascular;  Laterality: Right;   ABDOMINAL HYSTERECTOMY     Bladder Tack     Blader Tack     BREAST LUMPECTOMY  04-1999   BREAST LUMPECTOMY  05-1999   COLON SURGERY  07-2010   COLONOSCOPY W/ POLYPECTOMY     EDG  with polypectomy (gastric polyps)     EUS  05/24/2012   Procedure: UPPER ENDOSCOPIC ULTRASOUND (EUS) LINEAR;  Surgeon: Milus Banister, MD;  Location: [facility];  Service: Endoscopy;  Laterality: N/A;   Lumpectomy 07-2000  2002   Port a cath Insertion  2001   [place] REMOVAL  05-2000   TONSILLECTOMY  Social History:  reports that she has never smoked. She has never used smokeless tobacco. She reports that she does not drink alcohol and does not use drugs.   Allergies  Allergen Reactions   Latex Itching   Levofloxacin     Possible tendon rupture   Morphine And Related Nausea Only    Family history: Biological mother deceased, was diagnosed with heart disease at the age of 30.  At the age of 71 she died of complication from atrial  fibrillation. Maternal aunt deceased with history of heart disease, status post CABG at the age of 79.  Prior to Admission medications   Medication Sig Start Date End Date Taking? Authorizing Provider  amitriptyline (ELAVIL) 75 MG tablet Take 75 mg by mouth at bedtime. 06/23/21   [provider]  aspirin EC 81 MG tablet Take 81 mg by mouth daily. Swallow whole.    [provider]  atorvastatin (LIPITOR) 20 MG tablet Take 20 mg by mouth at bedtime. 08/06/14   [provider]  calcium carbonate (OS-CAL) 600 MG TABS Take 600 mg by mouth 2 (two) times daily with a meal. Vitamin D 400 mg    [provider]  celecoxib (CELEBREX) 200 MG capsule Take 200 mg by mouth 2 (two) times daily. 04/12/21   [provider]  Cholecalciferol (VITAMIN D3 PO) Take 5,000 Units by mouth at bedtime.    [provider]  clopidogrel (PLAVIX) 75 MG tablet Take 1 tablet (75 mg total) by mouth daily. Patient taking differently: Take 75 mg by mouth every evening. 08/25/21 08/25/22  Broadus John, MD  Coenzyme Q10 (COQ10 PO) Take 200 mg by mouth daily.    [provider]  fluticasone (FLONASE) 50 MCG/ACT nasal spray Place 1 spray into both nostrils daily as needed for allergies or rhinitis.    [provider]  furosemide (LASIX) 40 MG tablet Take 40 mg by mouth 2 (two) times daily as needed for fluid or edema. 06/24/21   [provider]  gabapentin (NEURONTIN) 600 MG tablet Take 600 mg by mouth 3 (three) times daily. 01/27/21   [provider]  losartan-hydrochlorothiazide (HYZAAR) 100-25 MG tablet Take 1 tablet by mouth daily. 06/23/21   [provider]  Lysine HCl 1000 MG TABS Take 1,000 mg by mouth daily as needed (wound healing).    [provider]  MAGNESIUM PO Take 400 mg by mouth at bedtime.    [provider]  montelukast (SINGULAIR) 10 MG tablet Take 10 mg by mouth daily. 04/10/21   [provider]   Multiple Vitamin (MULTIVITAMIN) tablet Take 1 tablet by mouth daily.    [provider]  mupirocin ointment (BACTROBAN) 2 % Apply 1 application. topically daily. 10/12/21   [provider]  neomycin-bacitracin-polymyxin (NEOSPORIN) ointment Apply 1 application. topically daily as needed for wound care.    [provider]  pantoprazole (PROTONIX) 40 MG tablet Take 40 mg by mouth daily. 01/08/20   [provider]  raloxifene (EVISTA) 60 MG tablet Take 60 mg by mouth daily.    [provider]    Physical Exam: BP (!) 153/81 (BP Location: Left Arm)   Pulse 96   Temp 98.3 F (36.8 C) (Oral)   Resp 18   SpO2 99%   General: 71 y.o. year-old female well developed well nourished in no acute distress.  Alert and oriented x3. Cardiovascular: Regular rate and rhythm with no rubs or gallops.  No thyromegaly or JVD  noted.  Right lower extremity edema, erythema, right third toe dry gangrene.  Respiratory: Clear to auscultation with no wheezes or rales. Good inspiratory effort. Abdomen: Soft nontender nondistended with normal bowel sounds x4 quadrants. Muskuloskeletal: No cyanosis, clubbing or edema noted bilaterally Neuro: CN II-XII intact, strength, sensation, reflexes Skin: Right lower extremity tender, erythematous, edematous, and warm. Psychiatry: Judgement and insight appear normal. Mood is appropriate for condition and setting          Labs on Admission:  Basic Metabolic Panel: No results for input(s): NA, K, CL, CO2, GLUCOSE, BUN, CREATININE, CALCIUM, MG, PHOS in the last 168 hours. Liver Function Tests: No results for input(s): AST, ALT, ALKPHOS, BILITOT, PROT, ALBUMIN in the last 168 hours. No results for input(s): LIPASE, AMYLASE in the last 168 hours. No results for input(s): AMMONIA in the last 168 hours. CBC: No results for input(s): WBC, NEUTROABS, HGB, HCT, MCV, PLT in the last 168 hours. Cardiac Enzymes: No results for input(s): CKTOTAL,  CKMB, CKMBINDEX, TROPONINI in the last 168 hours.  BNP (last 3 results) No results for input(s): BNP in the last 8760 hours.  ProBNP (last 3 results) No results for input(s): PROBNP in the last 8760 hours.  CBG: No results for input(s): GLUCAP in the last 168 hours.  Radiological Exams on Admission: No results found.  EKG: I independently viewed the EKG done and my findings are as followed: None available.  Ordered.  Assessment/Plan Present on Admission: **None**  Active Problems:   * No active hospital problems. *  Dry gangrene of the right third toe/nonpurulent cellulitis of the right leg. Presented with ongoing (>6 months) dry gangrene of the third right toe with associated cellulitis of the right leg without evidence of osteomyelitis on MRI status post multiple antibiotic courses including Bactrim for 7 days January 17, Augmentin for 14 days March 22, Augmentin for 14 days April 11, Bactrim for 7 days April 25, Augmentin for 10 days, Bactrim for 14 days on May 20, prescribed at discharge from [facility] after receiving IV vancomycin.   Admitted on 11/06/21 at [facility] and started on IV vancomycin.  ID consult there recommended evaluation by podiatry/vascular surgery for surgical options given recurrent admissions and failure of antibiotics with ongoing treatment.  Transferred to [facility] for pod[facility]ry/vascular consultation. Patient did have a CTA with runoff done at [facility] prior to discharge which was pending radiology read. B/L Dorsalis pedis pulses were palpable on exam Vascular surgery, infectious disease, and podiatry have been consulted via secure chat. Resume IV Vancomycin Gentle IV fluid hydration LR 50cc/hr x 2 days Wound care specialist consult Local wound care  Hypovolemic hyponatremia/AKI Resolved after resuming baseline p.o. intake and stopping all NSAIDs. CMP ordered.  Chronic normocytic anemia Hemoglobin on presentation 10.7 at [facility],  ferritin 44.2.  No signs of bleeding.   CBC ordered  PAD/hyperlipidemia Status post right femoral stent in 08/23/2021.   Continue home aspirin Plavix and statin. Analgesics as needed  Essential hypertension Held home HCTZ due to hyponatremia and to avoid hypotension.  Losartan was resumed at 100 mg daily and blood pressure was well controlled.  Dependent edema Patient is prescribed Lasix 40 mg outpatient P.o. Lasix has been held.  Chronic insomnia Reports several decades of insomnia which only responded to amitriptyline.  After retirement patient attempted to stop amitriptyline but was unable to sleep.   At [facility], she was started on mirtazapine 50 mg and melatonin 3 mg.  Amitriptyline was discontinued.  GERD Started famotidine 20 mg daily.  Hypokalemia 3.3 on admission at [facility] resolved the following day  History of breast cancer On raloxifene   DVT prophylaxis: Subcu Lovenox daily  Code Status: Full code  Family Communication: None at bedside.  Disposition Plan: Admitted to telemetry surgical unit  Consults called: Infectious disease Dr. Candiss Norse, vascular surgery Dr. Donzetta Matters, podiatry Dr. Sherryle Lis, consulted via secure chat.  Admission status: Inpatient status.   Status is: Inpatient The patient requires at least 2 midnights for further evaluation and treatment of present condition.   Kayleen Memos MD [facility] Pager [phone]  If 7PM-7AM, please contact night-coverage [email or web address] Password [email or web address]  11/09/2021, 7:30 PM

## 2021-11-09 NOTE — Consult Note (Signed)
Hospital Consult    Reason for Consult: Toe wounds Referring Physician: Dr. Nevada Crane MRN #:  [id number]  History of Present Illness: This is a 71 y.o. female with recent stenting of right SFA for concern of bruit.  She is now readmitted with cellulitis and has been readmitted a couple weeks ago with the same.  She is now hoping to have toe amputations during this hospitalization.  She remains on aspirin, Plavix and a statin.  Past Medical History:  Diagnosis Date   Arthritis    joints   Asthma    allergic   Breast cancer ([place])    Gastric mass 05/24/2012   GERD (gastroesophageal reflux disease)    H/O hiatal hernia    Heart murmur    not MVP no treatment   Hyperlipidemia    Hypertension    Insomnia    Osteopenia    Osteoporosis    PONV (postoperative nausea and vomiting)    Rosacea    neck,cheeks    Past Surgical History:  Procedure Laterality Date   ABDOMINAL AORTOGRAM W/LOWER EXTREMITY Right 08/25/2021   Procedure: ABDOMINAL AORTOGRAM W/LOWER EXTREMITY;  Surgeon: Broadus John, MD;  Location: [facility];  Service: Cardiovascular;  Laterality: Right;   ABDOMINAL HYSTERECTOMY     Bladder Tack     Blader Tack     BREAST LUMPECTOMY  04-1999   BREAST LUMPECTOMY  05-1999   COLON SURGERY  07-2010   COLONOSCOPY W/ POLYPECTOMY     EDG  with polypectomy (gastric polyps)     EUS  05/24/2012   Procedure: UPPER ENDOSCOPIC ULTRASOUND (EUS) LINEAR;  Surgeon: Milus Banister, MD;  Location: [facility];  Service: Endoscopy;  Laterality: N/A;   Lumpectomy 07-2000  2002   Port a cath Insertion  2001   PORT-A-CATH REMOVAL  05-2000   TONSILLECTOMY      Allergies  Allergen Reactions   Latex Itching   Levofloxacin     Possible tendon rupture   Morphine And Related Nausea Only    Prior to Admission medications   Medication Sig Start Date End Date Taking? Authorizing Provider  amitriptyline (ELAVIL) 75 MG tablet Take 75 mg by mouth at bedtime. 06/23/21   [provider]  aspirin EC 81 MG tablet Take 81 mg by mouth daily. Swallow whole.    [provider]  atorvastatin (LIPITOR) 20 MG tablet Take 20 mg by mouth at bedtime. 08/06/14   [provider]  calcium carbonate (OS-CAL) 600 MG TABS Take 600 mg by mouth 2 (two) times daily with a meal. Vitamin D 400 mg    [provider]  celecoxib (CELEBREX) 200 MG capsule Take 200 mg by mouth 2 (two) times daily. 04/12/21   [provider]  Cholecalciferol (VITAMIN D3 PO) Take 5,000 Units by mouth at bedtime.    [provider]  clopidogrel (PLAVIX) 75 MG tablet Take 1 tablet (75 mg total) by mouth daily. Patient taking differently: Take 75 mg by mouth every evening. 08/25/21 08/25/22  Broadus John, MD  Coenzyme Q10 (COQ10 PO) Take 200 mg by mouth daily.    [provider]  fluticasone (FLONASE) 50 MCG/ACT nasal spray Place 1 spray into both nostrils daily as needed for allergies or rhinitis.    [provider]  furosemide (LASIX) 40 MG tablet Take 40 mg by mouth 2 (two) times daily as needed for fluid or edema. 06/24/21   [provider]  gabapentin (NEURONTIN) 600 MG tablet  Take 600 mg by mouth 3 (three) times daily. 01/27/21   [provider]  losartan-hydrochlorothiazide (HYZAAR) 100-25 MG tablet Take 1 tablet by mouth daily. 06/23/21   [provider]  Lysine HCl 1000 MG TABS Take 1,000 mg by mouth daily as needed (wound healing).    [provider]  MAGNESIUM PO Take 400 mg by mouth at bedtime.    [provider]  montelukast (SINGULAIR) 10 MG tablet Take 10 mg by mouth daily. 04/10/21   [provider]  Multiple Vitamin (MULTIVITAMIN) tablet Take 1 tablet by mouth daily.    [provider]  mupirocin ointment (BACTROBAN) 2 % Apply 1 application. topically daily. 10/12/21   [provider]  neomycin-bacitracin-polymyxin (NEOSPORIN) ointment Apply 1 application. topically daily  as needed for wound care.    [provider]  pantoprazole (PROTONIX) 40 MG tablet Take 40 mg by mouth daily. 01/08/20   [provider]  raloxifene (EVISTA) 60 MG tablet Take 60 mg by mouth daily.    [provider]    Social History   Socioeconomic History   Marital status: Widowed    Spouse name: Not on file   Number of children: Not on file   Years of education: Not on file   Highest education level: Not on file  Occupational History   Not on file  Tobacco Use   Smoking status: Never   Smokeless tobacco: Never  Substance and Sexual Activity   Alcohol use: No   Drug use: No   Sexual activity: Not on file  Other Topics Concern   Not on file  Social History Narrative   Not on file   Social Determinants of Health   Financial Resource Strain: Not on file  Food Insecurity: Not on file  Transportation Needs: Not on file  Physical Activity: Not on file  Stress: Not on file  Social Connections: Not on file  Intimate Partner Violence: Not on file     No family history on file.  Review of Systems  Constitutional:  Positive for malaise/fatigue.  HENT: Negative.    Eyes: Negative.   Respiratory: Negative.    Cardiovascular:  Positive for leg swelling.  Gastrointestinal: Negative.   Musculoskeletal:        Ulceration of the toes with right leg wounds  Skin: Negative.   Neurological: Negative.   Endo/Heme/Allergies: Negative.   Psychiatric/Behavioral: Negative.       Physical Examination  Vitals:   11/09/21 1854 11/09/21 1925  BP: 138/63 (!) 153/81  Pulse: 99 96  Resp: 17 18  Temp: 98.4 F (36.9 C) 98.3 F (36.8 C)  SpO2: 100% 99%   Body mass index is 24.8 kg/m.  Physical Exam HENT:     Head: Normocephalic.  Cardiovascular:     Pulses:          Dorsalis pedis pulses are 2+ on the right side and 2+ on the left side.  Pulmonary:     Effort: Pulmonary effort is normal.  Abdominal:     General: Abdomen is flat.      Palpations: Abdomen is soft.  Musculoskeletal:     Right lower leg: Edema present.     Left lower leg: No edema.  Skin:    Capillary Refill: Capillary refill takes less than 2 seconds.     Comments: Ulceration right ankle as pictured below with gangrenous changes of toes as pictured  Neurological:     General: No focal deficit present.  Mental Status: She is alert and oriented to person, place, and time.      CBC    Component Value Date/Time   WBC 8.6 11/09/2021 2034   RBC 3.43 (L) 11/09/2021 2034   HGB 9.3 (L) 11/09/2021 2034   HCT 29.6 (L) 11/09/2021 2034   PLT 567 (H) 11/09/2021 2034   MCV 86.3 11/09/2021 2034   MCH 27.1 11/09/2021 2034   MCHC 31.4 11/09/2021 2034   RDW 17.0 (H) 11/09/2021 2034   LYMPHSABS 1.5 11/09/2021 2034   MONOABS 0.8 11/09/2021 2034   EOSABS 0.3 11/09/2021 2034   BASOSABS 0.1 11/09/2021 2034    BMET    Component Value Date/Time   NA 136 11/09/2021 2034   K 3.5 11/09/2021 2034   CL 102 11/09/2021 2034   CO2 26 11/09/2021 2034   GLUCOSE 138 (H) 11/09/2021 2034   BUN 14 11/09/2021 2034   CREATININE 0.83 11/09/2021 2034   CALCIUM 8.8 (L) 11/09/2021 2034   GFRNONAA >60 11/09/2021 2034    COAGS: No results found for: INR, PROTIME   Non-Invasive Vascular Imaging:   Recent ABI, right lower extremity duplex, CT angio and angiograms were reviewed   ASSESSMENT/PLAN: This is a 71 y.o. female transferred here with concern for cellulitis and nonhealing wounds of the right second and third toes.  She has recently undergone stenting of the right SFA with concern for blue toe syndrome.  She was again readmitted recently for cellulitis and underwent CT scan also duplex and ABIs on the right which all appeared satisfactory.  As before I will defer amputations to the podiatry service and I will notify her primary surgeon Dr. Virl Cagey of her admission tomorrow morning.  [name] C. Donzetta Matters, MD [facility] Office:  [phone] Pager: [phone]

## 2021-11-10 DIAGNOSIS — L97912 Non-pressure chronic ulcer of unspecified part of right lower leg with fat layer exposed: Secondary | ICD-10-CM | POA: Diagnosis not present

## 2021-11-10 DIAGNOSIS — E871 Hypo-osmolality and hyponatremia: Secondary | ICD-10-CM

## 2021-11-10 DIAGNOSIS — F32A Depression, unspecified: Secondary | ICD-10-CM

## 2021-11-10 DIAGNOSIS — D649 Anemia, unspecified: Secondary | ICD-10-CM

## 2021-11-10 DIAGNOSIS — D509 Iron deficiency anemia, unspecified: Secondary | ICD-10-CM | POA: Insufficient documentation

## 2021-11-10 DIAGNOSIS — I739 Peripheral vascular disease, unspecified: Secondary | ICD-10-CM

## 2021-11-10 DIAGNOSIS — I96 Gangrene, not elsewhere classified: Secondary | ICD-10-CM | POA: Diagnosis not present

## 2021-11-10 DIAGNOSIS — L97921 Non-pressure chronic ulcer of unspecified part of left lower leg limited to breakdown of skin: Secondary | ICD-10-CM

## 2021-11-10 DIAGNOSIS — F419 Anxiety disorder, unspecified: Secondary | ICD-10-CM | POA: Diagnosis not present

## 2021-11-10 DIAGNOSIS — L97919 Non-pressure chronic ulcer of unspecified part of right lower leg with unspecified severity: Secondary | ICD-10-CM

## 2021-11-10 DIAGNOSIS — I10 Essential (primary) hypertension: Secondary | ICD-10-CM

## 2021-11-10 LAB — CBC WITH DIFFERENTIAL/PLATELET
Abs Immature Granulocytes: 0.07 10*3/uL (ref 0.00–0.07)
Basophils Absolute: 0.1 10*3/uL (ref 0.0–0.1)
Basophils Relative: 1 %
Eosinophils Absolute: 0.3 10*3/uL (ref 0.0–0.5)
Eosinophils Relative: 4 %
HCT: 25.1 % — ABNORMAL LOW (ref 36.0–46.0)
Hemoglobin: 7.9 g/dL — ABNORMAL LOW (ref 12.0–15.0)
Immature Granulocytes: 1 %
Lymphocytes Relative: 20 %
Lymphs Abs: 1.7 10*3/uL (ref 0.7–4.0)
MCH: 27.1 pg (ref 26.0–34.0)
MCHC: 31.5 g/dL (ref 30.0–36.0)
MCV: 86 fL (ref 80.0–100.0)
Monocytes Absolute: 0.7 10*3/uL (ref 0.1–1.0)
Monocytes Relative: 9 %
Neutro Abs: 5.4 10*3/uL (ref 1.7–7.7)
Neutrophils Relative %: 65 %
Platelets: 533 10*3/uL — ABNORMAL HIGH (ref 150–400)
RBC: 2.92 MIL/uL — ABNORMAL LOW (ref 3.87–5.11)
RDW: 17.2 % — ABNORMAL HIGH (ref 11.5–15.5)
WBC: 8.3 10*3/uL (ref 4.0–10.5)
nRBC: 0 % (ref 0.0–0.2)

## 2021-11-10 LAB — PHOSPHORUS: Phosphorus: 3.7 mg/dL (ref 2.5–4.6)

## 2021-11-10 LAB — COMPREHENSIVE METABOLIC PANEL
ALT: 13 U/L (ref 0–44)
AST: 14 U/L — ABNORMAL LOW (ref 15–41)
Albumin: 2.5 g/dL — ABNORMAL LOW (ref 3.5–5.0)
Alkaline Phosphatase: 92 U/L (ref 38–126)
Anion gap: 10 (ref 5–15)
BUN: 14 mg/dL (ref 8–23)
CO2: 22 mmol/L (ref 22–32)
Calcium: 8.4 mg/dL — ABNORMAL LOW (ref 8.9–10.3)
Chloride: 102 mmol/L (ref 98–111)
Creatinine, Ser: 0.69 mg/dL (ref 0.44–1.00)
GFR, Estimated: >60 mL/min (ref >60)
Glucose, Bld: 132 mg/dL — ABNORMAL HIGH (ref 70–99)
Potassium: 3.7 mmol/L (ref 3.5–5.1)
Sodium: 134 mmol/L — ABNORMAL LOW (ref 135–145)
Total Bilirubin: 0.5 mg/dL (ref 0.3–1.2)
Total Protein: 5.1 g/dL — ABNORMAL LOW (ref 6.5–8.1)

## 2021-11-10 LAB — MAGNESIUM: Magnesium: 1.4 mg/dL — ABNORMAL LOW (ref 1.7–2.4)

## 2021-11-10 MED ORDER — ATORVASTATIN CALCIUM 40 MG PO TABS
40.0000 mg | ORAL_TABLET | Freq: Every day | ORAL | Status: DC
  Administered 2021-11-10 – 2021-11-11 (×2): 40 mg via ORAL
  Filled 2021-11-10 (×2): qty 1

## 2021-11-10 MED ORDER — MAGNESIUM SULFATE 4 GM/100ML IV SOLN
4.0000 g | Freq: Once | INTRAVENOUS | Status: AC
  Administered 2021-11-10: 4 g via INTRAVENOUS
  Filled 2021-11-10: qty 100

## 2021-11-10 NOTE — Care Management (Signed)
  Transition of Care [facility]) Screening Note   Patient Details  Name: [name] Date of Birth: [birth year]   Transition of Care [facility]) CM/SW Contact:    Carles Collet, RN Phone Number: 11/10/2021, 3:42 PM    Transition of Care Department [facility]) has reviewed patient and no TOC needs have been identified at this time. We will continue to monitor patient advancement through interdisciplinary progression rounds. If new patient transition needs arise, please place a TOC consult.

## 2021-11-10 NOTE — Assessment & Plan Note (Signed)
Relatively stable. -Continue monitoring

## 2021-11-10 NOTE — Consult Note (Signed)
WOC Nurse Consult Note: Reason for Consult: Gangrenous changes to right second and third toe.  S/P recent angioplasty and stenting.  Awaiting amputations. Vascular has assessed  Extremity is well perfused and has been referred to podiatry for surgery.  Wound type:infectious  vascular Pressure Injury POA: NA Measurement: second and third toe black with scattered 0.5 cm scabbed lesions to right anterior and lateral lower leg near malleolus.  Is on vancomycin for cellulitis to right lower leg.  Wound bed: black, necrotic Drainage (amount, consistency, odor) none Periwound: erythema and tender to touch.  Dressing procedure/placement/frequency: Cleanse right lower leg and right second and third toes with soap and water. Pat dry.  Apply Xeroform around toes and to scabbed wounds. Cover with gauze, kerlix and tape.  Change daily.  Will not follow at this time.  Please re-consult if needed.  Brandy Moras MSN, RN, FNP-BC CWON Wound, Ostomy, Continence Nurse Pager [phone]

## 2021-11-10 NOTE — Hospital Course (Addendum)
71 year old F with PMH of PAD with chronic right third toe ulcer s/p right femoral stent in 08/2021, HTN, HLD, anxiety, depression, insomnia, breast cancer and recent hospitalization from 5/16-5/20 for right third toe gangrene and cellulitis, admitted to [facility] with worsening right lower extremity swelling, pain and redness.  Patient was discharged on p.o. antibiotics that she did not tolerated due to nausea and vomiting after discharge.  Patient was started on IV vancomycin.  Infectious disease was consulted at [facility] and recommended surgical consultation, and patient was transferred here.  On arrival here, vitals and labs stable.  ID, vascular surgery and podiatry consulted.  Patient was started on IV vancomycin.  Vascular surgery evaluated patient and felt her distal circulation is optimal, and recommended podiatry evaluation.  Podiatry consulted.  She underwent partial amputation of right third toe and full-thickness excisional debridement of skin subcutaneous tissue in the right leg on 11/11/2021.  Patient received IV vancomycin in house with improvement in cellulitis.  She was cleared for discharge on p.o. doxycycline by podiatry and infectious disease.  Podiatry and infectious disease to arrange outpatient follow-up.  Referral for outpatient therapy ordered.

## 2021-11-10 NOTE — Progress Notes (Signed)
  Progress Note    11/10/2021 8:08 AM * No surgery found *  Subjective:  Feeling much better compared to a few days ago.  Denies fever, chills   Vitals:   11/10/21 0015 11/10/21 0524  BP: 139/73 (!) 157/89  Pulse: 90 94  Resp: 18 18  Temp: 98.9 F (37.2 C) 98.2 F (36.8 C)  SpO2: 97% 98%   Physical Exam: Lungs:  non labored Extremities:  palpable R DP pulse Neurologic: A&O  CBC    Component Value Date/Time   WBC 8.3 11/10/2021 0233   RBC 2.92 (L) 11/10/2021 0233   HGB 7.9 (L) 11/10/2021 0233   HCT 25.1 (L) 11/10/2021 0233   PLT 533 (H) 11/10/2021 0233   MCV 86.0 11/10/2021 0233   MCH 27.1 11/10/2021 0233   MCHC 31.5 11/10/2021 0233   RDW 17.2 (H) 11/10/2021 0233   LYMPHSABS 1.7 11/10/2021 0233   MONOABS 0.7 11/10/2021 0233   EOSABS 0.3 11/10/2021 0233   BASOSABS 0.1 11/10/2021 0233    BMET    Component Value Date/Time   NA 134 (L) 11/10/2021 0233   K 3.7 11/10/2021 0233   CL 102 11/10/2021 0233   CO2 22 11/10/2021 0233   GLUCOSE 132 (H) 11/10/2021 0233   BUN 14 11/10/2021 0233   CREATININE 0.69 11/10/2021 0233   CALCIUM 8.4 (L) 11/10/2021 0233   GFRNONAA >60 11/10/2021 0233    INR No results found for: INR   Intake/Output Summary (Last 24 hours) at 11/10/2021 8242 Last data filed at 11/09/2021 2309 Gross per 24 hour  Intake 240 ml  Output --  Net 240 ml     Assessment/Plan:  71 y.o. female with cellulitis and gangrenous toe of R foot s/p R SFA stenting  R foot is well perfused with palpable DP pulse; she is optimized from a vascular standpoint Patient will likely require toe amputations this hospitalization but will defer to Podiatry Agree with IV antibiotics Office will arrange surveillance of R SFA stent   Dagoberto Ligas, PA-C [facility] [phone] 11/10/2021 8:08 AM

## 2021-11-10 NOTE — Assessment & Plan Note (Addendum)
Stable.  On significant dose of Elavil. -Continue home Elavil but concerned about risk of adverse effect given age

## 2021-11-10 NOTE — Assessment & Plan Note (Signed)
Normotensive.  Continue home losartan

## 2021-11-10 NOTE — Consult Note (Addendum)
Reason for Consult: Gangrene of right third toe Referring Physician: Dr. Cyndia Skeeters, MD  [name] is an 71 y.o. female.  HPI: 5 female with a history of POD.  She underwent previous revascularization with Dr. Unk Lightning in March of this year.  There was hope that the wounds will be able to heal with local wound care as there was not osteomyelitis.  This has been waxing and waning in continue to worsen with recurrent cellulitis.  She was admitted here in May for the same issue and osteomyelitis was not found again on the MRI, she was recently admitted over the weekend to check hospital and was transferred here for further management.  Past Medical History:  Diagnosis Date   Arthritis    joints   Asthma    allergic   Breast cancer ([facility] [place])    Gastric mass 05/24/2012   GERD (gastroesophageal reflux disease)    H/O hiatal hernia    Heart murmur    not MVP no treatment   Hyperlipidemia    Hypertension    Insomnia    Osteopenia    Osteoporosis    PONV (postoperative nausea and vomiting)    Rosacea    neck,cheeks    Past Surgical History:  Procedure Laterality Date   ABDOMINAL AORTOGRAM W/LOWER EXTREMITY Right 08/25/2021   Procedure: ABDOMINAL AORTOGRAM W/LOWER EXTREMITY;  Surgeon: Broadus John, MD;  Location: [facility];  Service: Cardiovascular;  Laterality: Right;   ABDOMINAL HYSTERECTOMY     Bladder Tack     Blader Tack     BREAST LUMPECTOMY  04-1999   BREAST LUMPECTOMY  05-1999   COLON SURGERY  07-2010   COLONOSCOPY W/ POLYPECTOMY     EDG  with polypectomy (gastric polyps)     EUS  05/24/2012   Procedure: UPPER ENDOSCOPIC ULTRASOUND (EUS) LINEAR;  Surgeon: Milus Banister, MD;  Location: [facility];  Service: Endoscopy;  Laterality: N/A;   Lumpectomy 07-2000  2002   Port a cath Insertion  2001   PORT-A-CATH REMOVAL  05-2000   TONSILLECTOMY      No family history on file.  Social History:  reports that she has never smoked. She has never used smokeless  tobacco. She reports that she does not drink alcohol and does not use drugs.  Allergies:  Allergies  Allergen Reactions   Latex Itching   Levofloxacin Other (See Comments)    Possible tendon rupture   Morphine And Related Nausea Only    Medications: I have reviewed the patient's current medications.  Results for orders placed or performed during the hospital encounter of 11/09/21 (from the past 48 hour(s))  CBC with Differential/Platelet     Status: Abnormal   Collection Time: 11/09/21  8:34 PM  Result Value Ref Range   WBC 8.6 4.0 - 10.5 K/uL   RBC 3.43 (L) 3.87 - 5.11 MIL/uL   Hemoglobin 9.3 (L) 12.0 - 15.0 g/dL   HCT 29.6 (L) 36.0 - 46.0 %   MCV 86.3 80.0 - 100.0 fL   MCH 27.1 26.0 - 34.0 pg   MCHC 31.4 30.0 - 36.0 g/dL   RDW 17.0 (H) 11.5 - 15.5 %   Platelets 567 (H) 150 - 400 K/uL   nRBC 0.0 0.0 - 0.2 %   Neutrophils Relative % 68 %   Neutro Abs 5.9 1.7 - 7.7 K/uL   Lymphocytes Relative 17 %   Lymphs Abs 1.5 0.7 - 4.0 K/uL   Monocytes Relative 10 %  Monocytes Absolute 0.8 0.1 - 1.0 K/uL   Eosinophils Relative 3 %   Eosinophils Absolute 0.3 0.0 - 0.5 K/uL   Basophils Relative 1 %   Basophils Absolute 0.1 0.0 - 0.1 K/uL   Immature Granulocytes 1 %   Abs Immature Granulocytes 0.08 (H) 0.00 - 0.07 K/uL    Comment: Performed at [facility] [address], [place], [place] [address]  Comprehensive metabolic panel     Status: Abnormal   Collection Time: 11/09/21  8:34 PM  Result Value Ref Range   Sodium 136 135 - 145 mmol/L   Potassium 3.5 3.5 - 5.1 mmol/L   Chloride 102 98 - 111 mmol/L   CO2 26 22 - 32 mmol/L   Glucose, Bld 138 (H) 70 - 99 mg/dL    Comment: Glucose reference range applies only to samples taken after fasting for at least 8 hours.   BUN 14 8 - 23 mg/dL   Creatinine, Ser 0.83 0.44 - 1.00 mg/dL   Calcium 8.8 (L) 8.9 - 10.3 mg/dL   Total Protein 5.7 (L) 6.5 - 8.1 g/dL   Albumin 2.7 (L) 3.5 - 5.0 g/dL   AST 16 15 - 41 U/L   ALT 12 0 - 44 U/L    Alkaline Phosphatase 109 38 - 126 U/L   Total Bilirubin 0.1 (L) 0.3 - 1.2 mg/dL   GFR, Estimated >60 >60 mL/min    Comment: (NOTE) Calculated using the CKD-EPI Creatinine Equation (2021)    Anion gap 8 5 - 15    Comment: Performed at [facility], [place] [address], [place], [place] [address]  Magnesium     Status: Abnormal   Collection Time: 11/09/21  8:34 PM  Result Value Ref Range   Magnesium 1.4 (L) 1.7 - 2.4 mg/dL    Comment: Performed at [facility] [address], [place], [place] [address]  Phosphorus     Status: None   Collection Time: 11/09/21  8:34 PM  Result Value Ref Range   Phosphorus 3.1 2.5 - 4.6 mg/dL    Comment: Performed at [facility], [place] [address] [place], [place] [address]  CBC with Differential/Platelet     Status: Abnormal   Collection Time: 11/10/21  2:33 AM  Result Value Ref Range   WBC 8.3 4.0 - 10.5 K/uL   RBC 2.92 (L) 3.87 - 5.11 MIL/uL   Hemoglobin 7.9 (L) 12.0 - 15.0 g/dL   HCT 25.1 (L) 36.0 - 46.0 %   MCV 86.0 80.0 - 100.0 fL   MCH 27.1 26.0 - 34.0 pg   MCHC 31.5 30.0 - 36.0 g/dL   RDW 17.2 (H) 11.5 - 15.5 %   Platelets 533 (H) 150 - 400 K/uL   nRBC 0.0 0.0 - 0.2 %   Neutrophils Relative % 65 %   Neutro Abs 5.4 1.7 - 7.7 K/uL   Lymphocytes Relative 20 %   Lymphs Abs 1.7 0.7 - 4.0 K/uL   Monocytes Relative 9 %   Monocytes Absolute 0.7 0.1 - 1.0 K/uL   Eosinophils Relative 4 %   Eosinophils Absolute 0.3 0.0 - 0.5 K/uL   Basophils Relative 1 %   Basophils Absolute 0.1 0.0 - 0.1 K/uL   Immature Granulocytes 1 %   Abs Immature Granulocytes 0.07 0.00 - 0.07 K/uL    Comment: Performed at [facility] [address] [place], [place] [address]  Comprehensive metabolic panel     Status:  Abnormal   Collection Time: 11/10/21  2:33 AM  Result Value Ref Range   Sodium 134 (L) 135 - 145 mmol/L   Potassium 3.7 3.5 - 5.1 mmol/L   Chloride 102 98 - 111 mmol/L   CO2 22 22 - 32 mmol/L   Glucose, Bld 132 (H) 70 - 99 mg/dL     Comment: Glucose reference range applies only to samples taken after fasting for at least 8 hours.   BUN 14 8 - 23 mg/dL   Creatinine, Ser 0.69 0.44 - 1.00 mg/dL   Calcium 8.4 (L) 8.9 - 10.3 mg/dL   Total Protein 5.1 (L) 6.5 - 8.1 g/dL   Albumin 2.5 (L) 3.5 - 5.0 g/dL   AST 14 (L) 15 - 41 U/L   ALT 13 0 - 44 U/L   Alkaline Phosphatase 92 38 - 126 U/L   Total Bilirubin 0.5 0.3 - 1.2 mg/dL   GFR, Estimated >60 >60 mL/min    Comment: (NOTE) Calculated using the CKD-EPI Creatinine Equation (2021)    Anion gap 10 5 - 15    Comment: Performed at [facility], [place] [address] [place], [place] [address]  Magnesium     Status: Abnormal   Collection Time: 11/10/21  2:33 AM  Result Value Ref Range   Magnesium 1.4 (L) 1.7 - 2.4 mg/dL    Comment: Performed at [facility] [address], [place], [place] [address]  Phosphorus     Status: None   Collection Time: 11/10/21  2:33 AM  Result Value Ref Range   Phosphorus 3.7 2.5 - 4.6 mg/dL    Comment: Performed at [facility], [place] [address] [place], [place] [address]    No results found.  Review of Systems  Skin:        Ulcers on leg and toes of right foot  Blood pressure 129/71, pulse (!) 101, temperature 98.4 F (36.9 C), temperature source Oral, resp. rate 18, weight 63.5 kg, SpO2 100 %.  Vitals:   11/10/21 0858 11/10/21 1620  BP: 120/68 129/71  Pulse: 100 (!) 101  Resp: 16 18  Temp: 98.3 F (36.8 C) 98.4 F (36.9 C)  SpO2: 96% 100%    General AA&O x3. Normal mood and affect.  Vascular Dorsalis pedis and posterior tibial pulses  barely palpable right  Capillary refill brisk to all digits. Pedal hair growth diminished.  Neurologic Epicritic sensation grossly present.  Dermatologic (Wound) Dry gangrene of distal half of right third toe, there is scab on the nailbed of the second toe, partial eschar necrotic ulcerations on the lower leg  Orthopedic: Motor intact BLE.    Assessment/Plan:  Leg ulcers,  dry gangrene in setting of PAD with cellulitis -Imaging: Studies independently reviewed -Antibiotics: Continue broad-spectrum antibiotics -WB Status: WBAT -Surgical Plan: Plan for partial versus total third toe amputation tomorrow as well as debridement of the second toe and lower leg, possible application of the dermal skin substitute.  I discussed the risk and benefits of this and answered all her questions.  Informed consent will be signed.  She is optimized from a vascular standpoint now after her procedure in March. -N.p.o. past 0900  [name] 11/10/2021, 8:25 PM   Best available via secure chat for questions or concerns.

## 2021-11-10 NOTE — Assessment & Plan Note (Addendum)
S/p right femoral stent in 08/2021. -Continue aspirin and statin.

## 2021-11-10 NOTE — Assessment & Plan Note (Signed)
Mg 1.4.  IV magnesium sulfate 4 g x 1 -Continue monitoring

## 2021-11-10 NOTE — Progress Notes (Signed)
[facility] Infect[facility] Disease    Date of Admission:  11/09/2021   Total days of inpatient antibiotics 1        Reason for Consult:toe gangrene    Principal Problem:   Toe gangrene [facility])   Assessment: 71 year old female with recent admission for right lower extremity cellulitis and right third toe gangrene transferred from outside hospital for right third toe gangrene.  #Rt 3rd toe gangrene, failed outpatient therapy #RLE erythema -She was recently admitted to [facility] in 5/16 - 5/20 for right lower extremity cellulitis and right third toe gangrene.  Podiatry consulted did did not see signs of acute infection at that point and plan to follow-up outpatient.  Patient was discharged on Bactrim x2 weeks. -Presented to [facility] due to antibiotic intolerance on 6/3 she developed N/V and  noted to have RLE cellulitis. MRI showed  soft tissue ulceration at tip of 3rd toe. Placed back on vanc. D consulted and recommended surgical consultation transfer to [facility]  -I spoke with pt and her daughter, they report RLE erythema has been present for months, and is unchanged.  -Seen by Vascular today and pt is optimized from vascular standpoint Recommendations:  -Ok to continue vancomycin.  Not entirely convinced that she has cellulitis as the erythema has been unchanged for at least the past 3 months.  She noted being on and off antibiotics since January, 2023. Given dry gangrene would continue antibiotics. -Follow-up Podiatry reccs Microbiology:   Antibiotics: Bactrim prior to hospitalization Vancomycin 6/06-p   HPI: [name] is a 71 y.o. female with s/p stent in March 2023 Dr. Unk Lightning, hypertension, hyperlipidemia carditis, history of breast cancer, GERD, gastric mass, neuropathy, vitamin D deficiency, chronic insomnia, chronic anxiety/depression direct admitted from [facility] to [facility] due to right lower extremity cellulitis and right third toe dry gangrene.   Patient was recently admitted to [facility] from 5/16 - 5/24 gangrene of toe of the right foot and cellulitis.  MRI of the admission did not show evidence of osteomyelitis.  She clinically improved on vancomycin.  Wound swabs grew staph epi.  She was discharged on Bactrim x2 weeks.  Podiatry was consulted during that admission and decided to follow-up outpatient.  Patient reports she presented to the ED due to nausea and vomiting while taking Bactrim.  She reports right lower extremity erythema has been unchanged for the past 2 months.  She denies fever, chills, diarrhea.   Review of Systems: Review of Systems  All other systems reviewed and are negative.  Past Medical History:  Diagnosis Date   Arthritis    joints   Asthma    allergic   Breast cancer ([place])    Gastric mass 05/24/2012   GERD (gastroesophageal reflux disease)    H/O hiatal hernia    Heart murmur    not MVP no treatment   Hyperlipidemia    Hypertension    Insomnia    Osteopenia    Osteoporosis    PONV (postoperative nausea and vomiting)    Rosacea    neck,cheeks    Social History   Tobacco Use   Smoking status: Never   Smokeless tobacco: Never  Substance Use Topics   Alcohol use: No   Drug use: No    No family history on file. Scheduled Meds:  aspirin EC  81 mg Oral Daily   atorvastatin  40 mg Oral QHS   clopidogrel  75 mg Oral Daily  enoxaparin (LOVENOX) injection  40 mg Subcutaneous Q24H   losartan  100 mg Oral Daily   raloxifene  60 mg Oral Daily   senna-docusate  2 tablet Oral QHS   Continuous Infusions:  vancomycin     PRN Meds:.acetaminophen, HYDROmorphone (DILAUDID) injection, melatonin, oxyCODONE, polyethylene glycol, prochlorperazine Allergies  Allergen Reactions   Latex Itching   Levofloxacin     Possible tendon rupture   Morphine And Related Nausea Only    OBJECTIVE: Blood pressure 120/68, pulse 100, temperature 98.3 F (36.8 C), temperature source Oral, resp. rate 16, weight  63.5 kg, SpO2 96 %.  Physical Exam Constitutional:      Appearance: Normal appearance.  HENT:     Head: Normocephalic and atraumatic.     Right Ear: Tympanic membrane normal.     Left Ear: Tympanic membrane normal.     Nose: Nose normal.     Mouth/Throat:     Mouth: Mucous membranes are moist.  Eyes:     Extraocular Movements: Extraocular movements intact.     Conjunctiva/sclera: Conjunctivae normal.     Pupils: Pupils are equal, round, and reactive to light.  Cardiovascular:     Rate and Rhythm: Normal rate and regular rhythm.     Heart sounds: No murmur heard.   No friction rub. No gallop.  Pulmonary:     Effort: Pulmonary effort is normal.     Breath sounds: Normal breath sounds.  Abdominal:     General: Abdomen is flat.     Palpations: Abdomen is soft.  Musculoskeletal:        General: Normal range of motion.     Comments: Gangrene at the tip of rt 3rd toe. Erythema extends to mid calf.   Skin:    General: Skin is warm and dry.  Neurological:     General: No focal deficit present.     Mental Status: She is alert and oriented to person, place, and time.  Psychiatric:        Mood and Affect: Mood normal.    Lab Results Lab Results  Component Value Date   WBC 8.3 11/10/2021   HGB 7.9 (L) 11/10/2021   HCT 25.1 (L) 11/10/2021   MCV 86.0 11/10/2021   PLT 533 (H) 11/10/2021    Lab Results  Component Value Date   CREATININE 0.69 11/10/2021   BUN 14 11/10/2021   NA 134 (L) 11/10/2021   K 3.7 11/10/2021   CL 102 11/10/2021   CO2 22 11/10/2021    Lab Results  Component Value Date   ALT 13 11/10/2021   AST 14 (L) 11/10/2021   ALKPHOS 92 11/10/2021   BILITOT 0.5 11/10/2021       Laurice Record, [facility] 11/10/2021, 11:16 AM

## 2021-11-10 NOTE — Progress Notes (Signed)
PROGRESS NOTE  [name] KYH:[id number] DOB: December 03, 1950   PCP: [name], MD  Patient is from: Home.  DOA: 11/09/2021 LOS: 1  Chief complaints No chief complaint on file.    Brief Narrative / Interim history: 71 year old F with PMH of PAD with chronic right third toe ulcer s/p right femoral stent in 08/2021, HTN, HLD, anxiety, depression, insomnia, breast cancer and recent hospitalization from 5/16-5/20 for right third toe gangrene and cellulitis, admitted to [facility] with worsening right lower extremity swelling, pain and redness.  Patient was discharged on p.o. antibiotics that she did not tolerated due to nausea and vomiting after discharge.  Patient was started on IV vancomycin.  Infectious disease was consulted at [facility] and recommended surgical consultation, and patient was transferred here.  On arrival here, vitals and labs stable.  ID, vascular surgery and podiatry consulted.  Patient was started on IV vancomycin.  Vascular surgery recommends podiatry evaluation.  ID recommends continuing vancomycin for now.  Podiatry to see patient.     Subjective: Seen and examined earlier this morning.  No major events overnight of this morning.  Patient's daughter at bedside.  No complaints other than some pain in her right leg and right foot.  Redness seems to be stable.  She denies chest pain, dyspnea, dizziness, nausea and vomiting.  She states she had bowel accident earlier this morning that she attributes to the MiraLAX she took.  Objective: Vitals:   11/09/21 2100 11/10/21 0015 11/10/21 0524 11/10/21 0858  BP:  139/73 (!) 157/89 120/68  Pulse:  90 94 100  Resp:  '18 18 16  '$ Temp:  98.9 F (37.2 C) 98.2 F (36.8 C) 98.3 F (36.8 C)  TempSrc:  Oral Oral Oral  SpO2:  97% 98% 96%  Weight: 63.5 kg       Examination:  GENERAL: No apparent distress.  Nontoxic. HEENT: MMM.  Vision and hearing grossly intact.  NECK: Supple.  No apparent JVD.  RESP:  No IWOB.  Fair  aeration bilaterally. CVS:  RRR. Heart sounds normal.  ABD/GI/GU: BS+. Abd soft, NTND.  MSK/EXT:  Moves extremities.  Right lower extremity erythema with scattered ulceration and dry gangrene in right third toe.  2+ DP and PT pulses.  SKIN: As above. NEURO: Awake, alert and oriented appropriately.  No apparent focal neuro deficit. PSYCH: Calm. Normal affect.   Procedures:  None  Microbiology summarized: None  Assessment and Plan: * Gangrene of toe of right foot [facility]) Patient had right femoral stenting 08/2021.  She seems to have good DP and PT pulses in right leg.  She has erythema and scattered ulceration in lower extremity.  No apparent drainage -Appreciate input by ID and vascular surgery -Podiatry to evaluate patient later today. -Continue IV vancomycin for now  Multiple right leg ulcer with erythema Seems chronic.  No purulent drainage.  She has lower extremity erythema.  No extension since admission.  Has no fever or leukocytosis either. -Antibiotics as above -Appreciate help by wound care  PAD (peripheral artery disease) ([facility]) S/p right femoral stent in 08/2021. -Continue aspirin-increase statin to 40 mg daily  Hyponatremia Relatively stable. -Continue monitoring  Hypomagnesemia Mg 1.4.  IV magnesium sulfate 4 g x 1 -Continue monitoring  Normocytic anemia Recent Labs    08/25/21 0837 10/19/21 1342 10/20/21 0242 10/21/21 0055 11/09/21 2034 11/10/21 0233  HGB 11.9* 10.5* 8.9* 8.8* 9.3* 7.9*  Gradual drop in Hgb.  Partly dilutional from IV fluid.  Patient denies melena or  hematochezia. -Discontinue IV fluid -Check anemia panel in the morning -Monitor H&H   Essential hypertension Normotensive.  Continue home losartan  Anxiety, depression and insomnia Stable.  On significant dose of Elavil. -Continue home Elavil but concerned about risk of adverse effect given age    DVT prophylaxis:  enoxaparin (LOVENOX) injection 40 mg Start: 11/09/21 2200  Code  Status: Full code Family Communication: Updated patient's daughter at bedside. Level of care: Med-Surg Status is: Inpatient Remains inpatient appropriate because: Toe gangrene and foot pain and possible infection   Final disposition: TBD Consultants:  Infectious disease Vascular surgery Podiatry  Sch Meds:  Scheduled Meds:  aspirin EC  81 mg Oral Daily   atorvastatin  40 mg Oral QHS   clopidogrel  75 mg Oral Daily   enoxaparin (LOVENOX) injection  40 mg Subcutaneous Q24H   losartan  100 mg Oral Daily   raloxifene  60 mg Oral Daily   senna-docusate  2 tablet Oral QHS   Continuous Infusions:  vancomycin     PRN Meds:.acetaminophen, HYDROmorphone (DILAUDID) injection, melatonin, oxyCODONE, polyethylene glycol, prochlorperazine  Antimicrobials: Anti-infectives (From admission, onward)    Start     Dose/Rate Route Frequency Ordered Stop   11/10/21 2200  vancomycin (VANCOCIN) IVPB 1000 mg/200 mL premix        1,000 mg 200 mL/hr over 60 Minutes Intravenous Every 24 hours 11/09/21 2130     11/09/21 2215  vancomycin (VANCOREADY) IVPB 1250 mg/250 mL        1,250 mg 166.7 mL/hr over 90 Minutes Intravenous  Once 11/09/21 2127 11/10/21 0043        I have personally reviewed the following labs and images: CBC: Recent Labs  Lab 11/09/21 2034 11/10/21 0233  WBC 8.6 8.3  NEUTROABS 5.9 5.4  HGB 9.3* 7.9*  HCT 29.6* 25.1*  MCV 86.3 86.0  PLT 567* 533*   BMP &GFR Recent Labs  Lab 11/09/21 2034 11/10/21 0233  NA 136 134*  K 3.5 3.7  CL 102 102  CO2 26 22  GLUCOSE 138* 132*  BUN 14 14  CREATININE 0.83 0.69  CALCIUM 8.8* 8.4*  MG 1.4* 1.4*  PHOS 3.1 3.7   Estimated Creatinine Clearance: 57.8 mL/min (by C-G formula based on SCr of 0.69 mg/dL). Liver & Pancreas: Recent Labs  Lab 11/09/21 2034 11/10/21 0233  AST 16 14*  ALT 12 13  ALKPHOS 109 92  BILITOT 0.1* 0.5  PROT 5.7* 5.1*  ALBUMIN 2.7* 2.5*   No results for input(s): LIPASE, AMYLASE in the last 168  hours. No results for input(s): AMMONIA in the last 168 hours. Diabetic: No results for input(s): HGBA1C in the last 72 hours. No results for input(s): GLUCAP in the last 168 hours. Cardiac Enzymes: No results for input(s): CKTOTAL, CKMB, CKMBINDEX, TROPONINI in the last 168 hours. No results for input(s): PROBNP in the last 8760 hours. Coagulation Profile: No results for input(s): INR, PROTIME in the last 168 hours. Thyroid Function Tests: No results for input(s): TSH, T4TOTAL, FREET4, T3FREE, THYROIDAB in the last 72 hours. Lipid Profile: No results for input(s): CHOL, HDL, LDLCALC, TRIG, CHOLHDL, LDLDIRECT in the last 72 hours. Anemia Panel: No results for input(s): VITAMINB12, FOLATE, FERRITIN, TIBC, IRON, RETICCTPCT in the last 72 hours. Urine analysis: No results found for: COLORURINE, APPEARANCEUR, LABSPEC, PHURINE, GLUCOSEU, HGBUR, BILIRUBINUR, KETONESUR, PROTEINUR, UROBILINOGEN, NITRITE, LEUKOCYTESUR Sepsis Labs: Invalid input(s): PROCALCITONIN, [place]  Microbiology: No results found for this or any previous visit (from the past 240 hour(s)).  Radiology  Studies: No results found.    Saanvi Hakala T. [place]  If 7PM-7AM, please contact night-coverage [email or web address] 11/10/2021, 3:06 PM

## 2021-11-10 NOTE — Assessment & Plan Note (Addendum)
Patient had right femoral stenting 08/2021.  She seems to have good DP and PT pulses in right leg.  She has erythema and scattered ulceration in lower extremity.  No apparent drainage.  Erythema improved. -S/p partial amputation of right third toe by podiatry, Dr. Sherryle Lis on 6/8. -Vancomycin for 2 days in house.  Discharged on p.o. doxycycline for 8 more days per podiatry and ID -Podiatry to follow-up on tissue culture and pathology -Podiatry and ID to arrange outpatient follow-up. -WBAT on right leg.  Dressing change every other day.

## 2021-11-10 NOTE — Assessment & Plan Note (Addendum)
Recent Labs    08/25/21 0837 10/19/21 1342 10/20/21 0242 10/21/21 0055 11/09/21 2034 11/10/21 0233 11/11/21 0204 11/12/21 0825  HGB 11.9* 10.5* 8.9* 8.8* 9.3* 7.9* 7.9* 8.1*  Gradual drop in Hgb.  Partly dilutional from IV fluid.  Patient denies melena or hematochezia.  Anemia panel with iron deficiency. -Received IV ferric gluconate 250 mg x 1 prior to discharge. -Recheck CBC at follow-up

## 2021-11-10 NOTE — Assessment & Plan Note (Addendum)
Seems chronic.  No purulent drainage.  She has lower extremity erythema.  Seems erythema is receding.  Has no fever or leukocytosis either. -S/p full-thickness excisional debridement of skin subcutaneous tissues -Antibiotics as above -Follow-up pathology

## 2021-11-11 ENCOUNTER — Inpatient Hospital Stay (HOSPITAL_COMMUNITY): Payer: Medicare HMO | Admitting: Anesthesiology

## 2021-11-11 ENCOUNTER — Other Ambulatory Visit: Payer: Self-pay

## 2021-11-11 ENCOUNTER — Encounter (HOSPITAL_COMMUNITY): Payer: Self-pay | Admitting: Internal Medicine

## 2021-11-11 ENCOUNTER — Encounter (HOSPITAL_COMMUNITY)
Admission: AD | Disposition: A | Discharge status: Home / Self Care | Payer: Self-pay | Source: Other Acute Inpatient Hospital | Attending: Student

## 2021-11-11 DIAGNOSIS — L97519 Non-pressure chronic ulcer of other part of right foot with unspecified severity: Secondary | ICD-10-CM

## 2021-11-11 DIAGNOSIS — D649 Anemia, unspecified: Secondary | ICD-10-CM | POA: Diagnosis not present

## 2021-11-11 DIAGNOSIS — I96 Gangrene, not elsewhere classified: Secondary | ICD-10-CM

## 2021-11-11 DIAGNOSIS — F419 Anxiety disorder, unspecified: Secondary | ICD-10-CM | POA: Diagnosis not present

## 2021-11-11 DIAGNOSIS — E878 Other disorders of electrolyte and fluid balance, not elsewhere classified: Secondary | ICD-10-CM

## 2021-11-11 DIAGNOSIS — I10 Essential (primary) hypertension: Secondary | ICD-10-CM

## 2021-11-11 DIAGNOSIS — I739 Peripheral vascular disease, unspecified: Secondary | ICD-10-CM | POA: Diagnosis not present

## 2021-11-11 DIAGNOSIS — L97912 Non-pressure chronic ulcer of unspecified part of right lower leg with fat layer exposed: Secondary | ICD-10-CM | POA: Diagnosis not present

## 2021-11-11 HISTORY — PX: WOUND DEBRIDEMENT: SHX247

## 2021-11-11 HISTORY — PX: AMPUTATION TOE: SHX6595

## 2021-11-11 LAB — CBC WITH DIFFERENTIAL/PLATELET
Abs Immature Granulocytes: 0.07 10*3/uL (ref 0.00–0.07)
Basophils Absolute: 0.1 10*3/uL (ref 0.0–0.1)
Basophils Relative: 1 %
Eosinophils Absolute: 0.3 10*3/uL (ref 0.0–0.5)
Eosinophils Relative: 4 %
HCT: 24.7 % — ABNORMAL LOW (ref 36.0–46.0)
Hemoglobin: 7.9 g/dL — ABNORMAL LOW (ref 12.0–15.0)
Immature Granulocytes: 1 %
Lymphocytes Relative: 20 %
Lymphs Abs: 1.6 10*3/uL (ref 0.7–4.0)
MCH: 27.4 pg (ref 26.0–34.0)
MCHC: 32 g/dL (ref 30.0–36.0)
MCV: 85.8 fL (ref 80.0–100.0)
Monocytes Absolute: 0.9 10*3/uL (ref 0.1–1.0)
Monocytes Relative: 11 %
Neutro Abs: 5.2 10*3/uL (ref 1.7–7.7)
Neutrophils Relative %: 63 %
Platelets: 521 10*3/uL — ABNORMAL HIGH (ref 150–400)
RBC: 2.88 MIL/uL — ABNORMAL LOW (ref 3.87–5.11)
RDW: 17.2 % — ABNORMAL HIGH (ref 11.5–15.5)
WBC: 8.1 10*3/uL (ref 4.0–10.5)
nRBC: 0 % (ref 0.0–0.2)

## 2021-11-11 LAB — IRON AND TIBC
Iron: 34 ug/dL (ref 28–170)
Saturation Ratios: 11 % (ref 10.4–31.8)
TIBC: 297 ug/dL (ref 250–450)
UIBC: 263 ug/dL

## 2021-11-11 LAB — RENAL FUNCTION PANEL
Albumin: 2.5 g/dL — ABNORMAL LOW (ref 3.5–5.0)
Anion gap: 6 (ref 5–15)
BUN: 9 mg/dL (ref 8–23)
CO2: 23 mmol/L (ref 22–32)
Calcium: 8.6 mg/dL — ABNORMAL LOW (ref 8.9–10.3)
Chloride: 107 mmol/L (ref 98–111)
Creatinine, Ser: 0.62 mg/dL (ref 0.44–1.00)
GFR, Estimated: >60 mL/min (ref >60)
Glucose, Bld: 122 mg/dL — ABNORMAL HIGH (ref 70–99)
Phosphorus: 3.5 mg/dL (ref 2.5–4.6)
Potassium: 4 mmol/L (ref 3.5–5.1)
Sodium: 136 mmol/L (ref 135–145)

## 2021-11-11 LAB — VITAMIN B12: Vitamin B-12: 1329 pg/mL — ABNORMAL HIGH (ref 180–914)

## 2021-11-11 LAB — RETICULOCYTES
Immature Retic Fract: 29.5 % — ABNORMAL HIGH (ref 2.3–15.9)
RBC.: 2.92 MIL/uL — ABNORMAL LOW (ref 3.87–5.11)
Retic Count, Absolute: 54.9 10*3/uL (ref 19.0–186.0)
Retic Ct Pct: 1.9 % (ref 0.4–3.1)

## 2021-11-11 LAB — MAGNESIUM: Magnesium: 2.2 mg/dL (ref 1.7–2.4)

## 2021-11-11 LAB — FOLATE: Folate: 10.9 ng/mL (ref >5.9)

## 2021-11-11 LAB — FERRITIN: Ferritin: 17 ng/mL (ref 11–307)

## 2021-11-11 SURGERY — AMPUTATION, TOE
Anesthesia: General | Site: Toe | Laterality: Right

## 2021-11-11 MED ORDER — DEXAMETHASONE SODIUM PHOSPHATE 10 MG/ML IJ SOLN
INTRAMUSCULAR | Status: DC | PRN
  Administered 2021-11-11: 5 mg via INTRAVENOUS

## 2021-11-11 MED ORDER — PROPOFOL 10 MG/ML IV BOLUS
INTRAVENOUS | Status: AC
  Filled 2021-11-11: qty 20

## 2021-11-11 MED ORDER — PROPOFOL 10 MG/ML IV BOLUS
INTRAVENOUS | Status: DC | PRN
  Administered 2021-11-11: 35 mg via INTRAVENOUS
  Administered 2021-11-11: 40 mg via INTRAVENOUS

## 2021-11-11 MED ORDER — BUPIVACAINE HCL (PF) 0.5 % IJ SOLN
INTRAMUSCULAR | Status: DC | PRN
  Administered 2021-11-11: 30 mL

## 2021-11-11 MED ORDER — 0.9 % SODIUM CHLORIDE (POUR BTL) OPTIME
TOPICAL | Status: DC | PRN
  Administered 2021-11-11: 1000 mL

## 2021-11-11 MED ORDER — FENTANYL CITRATE (PF) 100 MCG/2ML IJ SOLN: 25.0000 ug | INTRAMUSCULAR | Status: DC | PRN

## 2021-11-11 MED ORDER — ONDANSETRON HCL 4 MG/2ML IJ SOLN
4.0000 mg | Freq: Once | INTRAMUSCULAR | Status: AC
  Administered 2021-11-11: 4 mg via INTRAVENOUS
  Filled 2021-11-11: qty 2

## 2021-11-11 MED ORDER — LIDOCAINE 2% (20 MG/ML) 5 ML SYRINGE
INTRAMUSCULAR | Status: DC | PRN
  Administered 2021-11-11: 60 mg via INTRAVENOUS

## 2021-11-11 MED ORDER — CHLORHEXIDINE GLUCONATE 0.12 % MT SOLN
OROMUCOSAL | Status: AC
  Administered 2021-11-11: 15 mL
  Filled 2021-11-11: qty 15

## 2021-11-11 MED ORDER — FENTANYL CITRATE (PF) 250 MCG/5ML IJ SOLN
INTRAMUSCULAR | Status: AC
  Filled 2021-11-11: qty 5

## 2021-11-11 MED ORDER — PANTOPRAZOLE SODIUM 40 MG PO TBEC
40.0000 mg | DELAYED_RELEASE_TABLET | Freq: Every day | ORAL | Status: DC
Start: 2021-11-12 — End: 2021-11-12
  Administered 2021-11-12: 40 mg via ORAL
  Filled 2021-11-11: qty 1

## 2021-11-11 MED ORDER — DEXAMETHASONE SODIUM PHOSPHATE 10 MG/ML IJ SOLN
INTRAMUSCULAR | Status: AC
  Filled 2021-11-11: qty 1

## 2021-11-11 MED ORDER — PANTOPRAZOLE SODIUM 40 MG IV SOLR
40.0000 mg | INTRAVENOUS | Status: DC
  Administered 2021-11-11: 40 mg via INTRAVENOUS
  Filled 2021-11-11: qty 10

## 2021-11-11 MED ORDER — ONDANSETRON HCL 4 MG/2ML IJ SOLN
INTRAMUSCULAR | Status: AC
  Filled 2021-11-11: qty 2

## 2021-11-11 MED ORDER — ONDANSETRON HCL 4 MG/2ML IJ SOLN
INTRAMUSCULAR | Status: DC | PRN
  Administered 2021-11-11: 4 mg via INTRAVENOUS

## 2021-11-11 MED ORDER — LIDOCAINE 2% (20 MG/ML) 5 ML SYRINGE
INTRAMUSCULAR | Status: AC
  Filled 2021-11-11: qty 5

## 2021-11-11 MED ORDER — ONDANSETRON HCL 4 MG/2ML IJ SOLN
4.0000 mg | Freq: Once | INTRAMUSCULAR | Status: DC | PRN
Start: 2021-11-11 — End: 2021-11-11

## 2021-11-11 MED ORDER — FENTANYL CITRATE (PF) 250 MCG/5ML IJ SOLN
INTRAMUSCULAR | Status: DC | PRN
Start: 2021-11-11 — End: 2021-11-11
  Administered 2021-11-11 (×2): 50 ug via INTRAVENOUS

## 2021-11-11 MED ORDER — LACTATED RINGERS IV SOLN: INTRAVENOUS | Status: DC

## 2021-11-11 MED ORDER — CHLORHEXIDINE GLUCONATE 0.12 % MT SOLN: 15.0000 mL | Freq: Once | OROMUCOSAL | Status: DC

## 2021-11-11 MED ORDER — ORAL CARE MOUTH RINSE: 15.0000 mL | Freq: Once | OROMUCOSAL | Status: DC

## 2021-11-11 MED ORDER — BUPIVACAINE HCL (PF) 0.5 % IJ SOLN
INTRAMUSCULAR | Status: AC
  Filled 2021-11-11: qty 30

## 2021-11-11 MED ORDER — DEXTROSE IN LACTATED RINGERS 5 % IV SOLN: INTRAVENOUS | Status: DC

## 2021-11-11 MED ORDER — PHENYLEPHRINE 80 MCG/ML (10ML) SYRINGE FOR IV PUSH (FOR BLOOD PRESSURE SUPPORT)
PREFILLED_SYRINGE | INTRAVENOUS | Status: DC | PRN
  Administered 2021-11-11: 80 ug via INTRAVENOUS
  Administered 2021-11-11 (×2): 160 ug via INTRAVENOUS

## 2021-11-11 MED ORDER — PROPOFOL 500 MG/50ML IV EMUL
INTRAVENOUS | Status: DC | PRN
  Administered 2021-11-11: 100 ug/kg/min via INTRAVENOUS

## 2021-11-11 SURGICAL SUPPLY — 36 items
BAG COUNTER SPONGE SURGICOUNT (BAG) ×4 IMPLANT
BAG SPNG CNTER NS LX DISP (BAG) ×2
BNDG ELASTIC 6X5.8 VLCR STR LF (GAUZE/BANDAGES/DRESSINGS) ×1 IMPLANT
BNDG GAUZE ELAST 4 BULKY (GAUZE/BANDAGES/DRESSINGS) ×1 IMPLANT
COVER SURGICAL LIGHT HANDLE (MISCELLANEOUS) ×4 IMPLANT
ELECT REM PT RETURN 9FT ADLT (ELECTROSURGICAL) ×3
ELECTRODE REM PT RTRN 9FT ADLT (ELECTROSURGICAL) ×3 IMPLANT
GAUZE SPONGE 4X4 12PLY STRL (GAUZE/BANDAGES/DRESSINGS) ×1 IMPLANT
GAUZE XEROFORM 1X8 LF (GAUZE/BANDAGES/DRESSINGS) ×1 IMPLANT
GLOVE BIOGEL M 7.0 STRL (GLOVE) ×4 IMPLANT
GLOVE BIOGEL PI ORTHO PRO 7.5 (GLOVE) ×1
GLOVE PI ORTHO PRO STRL 7.5 (GLOVE) ×3 IMPLANT
GOWN STRL REUS W/ TWL LRG LVL3 (GOWN DISPOSABLE) ×6 IMPLANT
GOWN STRL REUS W/TWL LRG LVL3 (GOWN DISPOSABLE) ×6
KIT BASIN OR (CUSTOM PROCEDURE TRAY) ×4 IMPLANT
KIT TURNOVER KIT B (KITS) ×4 IMPLANT
MANIFOLD NEPTUNE II (INSTRUMENTS) ×4 IMPLANT
MATRIX WOUND 3-LAYER 5X5 (Tissue) ×1 IMPLANT
MICROMATRIX 500MG (Tissue) ×3 IMPLANT
NDL HYPO 25GX1X1/2 BEV (NEEDLE) IMPLANT
NEEDLE HYPO 25GX1X1/2 BEV (NEEDLE) ×3 IMPLANT
NS IRRIG 1000ML POUR BTL (IV SOLUTION) ×4 IMPLANT
PACK ORTHO EXTREMITY (CUSTOM PROCEDURE TRAY) ×4 IMPLANT
SOL PREP POV-IOD 4OZ 10% (MISCELLANEOUS) ×10 IMPLANT
SOLUTION PARTIC MCRMTRX 500MG (Tissue) IMPLANT
SPECIMEN JAR SMALL (MISCELLANEOUS) ×5 IMPLANT
STAPLER VISISTAT 35W (STAPLE) ×1 IMPLANT
SUT MNCRL AB 3-0 PS2 18 (SUTURE) ×4 IMPLANT
SWAB CULTURE ESWAB REG 1ML (MISCELLANEOUS) ×1 IMPLANT
SYR CONTROL 10ML LL (SYRINGE) ×1 IMPLANT
TOWEL GREEN STERILE (TOWEL DISPOSABLE) ×4 IMPLANT
TOWEL GREEN STERILE FF (TOWEL DISPOSABLE) ×4 IMPLANT
TUBE CONNECTING 12X1/4 (SUCTIONS) ×4 IMPLANT
UNDERPAD 30X36 HEAVY ABSORB (UNDERPADS AND DIAPERS) ×4 IMPLANT
WATER STERILE IRR 1000ML POUR (IV SOLUTION) ×4 IMPLANT
YANKAUER SUCT BULB TIP NO VENT (SUCTIONS) ×4 IMPLANT

## 2021-11-11 NOTE — H&P (Signed)
History and Physical Interval Note:  11/11/2021 7:12 PM  [name]  has presented today for surgery, with the diagnosis of gangrene, ulcerations.  The various methods of treatment have been discussed with the patient and family. After consideration of risks, benefits and other options for treatment, the patient has consented to   Procedure(s) with comments: AMPUTATION TOE (Right) - third toe DEBRIDEMENT WOUND (Right) as a surgical intervention.  The patient's history has been reviewed, patient examined, no change in status, stable for surgery.  I have reviewed the patient's chart and labs.  Questions were answered to the patient's satisfaction.     Criselda Peaches

## 2021-11-11 NOTE — Assessment & Plan Note (Addendum)
Hypomagnesemia resolved.  Hyponatremia mild.  Recheck at follow-up

## 2021-11-11 NOTE — Progress Notes (Signed)
  Progress Note    11/11/2021 11:43 AM * No surgery date entered *  Subjective:  Feeling much better compared to a few days ago.  Denies fever, chills   Vitals:   11/11/21 0418 11/11/21 0906  BP: 135/82 (!) 148/83  Pulse: 92 87  Resp: 17 18  Temp: 98.4 F (36.9 C) 98.2 F (36.8 C)  SpO2: 99% 98%   Physical Exam: Lungs:  non labored Extremities:  palpable R DP pulse Neurologic: A&O  CBC    Component Value Date/Time   WBC 8.1 11/11/2021 0204   RBC 2.88 (L) 11/11/2021 0204   RBC 2.92 (L) 11/11/2021 0204   HGB 7.9 (L) 11/11/2021 0204   HCT 24.7 (L) 11/11/2021 0204   PLT 521 (H) 11/11/2021 0204   MCV 85.8 11/11/2021 0204   MCH 27.4 11/11/2021 0204   MCHC 32.0 11/11/2021 0204   RDW 17.2 (H) 11/11/2021 0204   LYMPHSABS 1.6 11/11/2021 0204   MONOABS 0.9 11/11/2021 0204   EOSABS 0.3 11/11/2021 0204   BASOSABS 0.1 11/11/2021 0204    BMET    Component Value Date/Time   NA 136 11/11/2021 0204   K 4.0 11/11/2021 0204   CL 107 11/11/2021 0204   CO2 23 11/11/2021 0204   GLUCOSE 122 (H) 11/11/2021 0204   BUN 9 11/11/2021 0204   CREATININE 0.62 11/11/2021 0204   CALCIUM 8.6 (L) 11/11/2021 0204   GFRNONAA >60 11/11/2021 0204    INR No results found for: "INR"   Intake/Output Summary (Last 24 hours) at 11/11/2021 1143 Last data filed at 11/11/2021 0910 Gross per 24 hour  Intake 120 ml  Output --  Net 120 ml      Assessment/Plan:  71 y.o. female with cellulitis and gangrenous toe of R foot s/p R SFA stenting  R foot is well perfused with palpable DP pulse; she is optimized from a vascular standpoint  The wounds on the right leg are not typical of venous insufficiency.  These are pustules.  She would benefit from dermatology evaluation.  I am available as needed  Broadus John MD [facility] [phone] 11/11/2021 11:43 AM

## 2021-11-11 NOTE — Treatment Plan (Addendum)
Expect she will be able to be discharged home tomorrow on doxycycline.  I will follow culture and change antibiotics as needed.  WBAT in surgical shoe.  Wound biopsy was taken I will follow-up on the results of this as well and we will manage as needed as an outpatient.  Would recommend every other day dressing changes with mupirocin ointment to the ulcerated areas of the leg and foot, does not need to apply ointment to the amputation site, dressed with dry sterile gauze  Lanae Crumbly, DPM 11/11/2021

## 2021-11-11 NOTE — Transfer of Care (Signed)
Immediate Anesthesia Transfer of Care Note  Patient: [name]  Procedure(s) Performed: AMPUTATION TOE (Right: Toe) DEBRIDEMENT WOUND (Right)  Patient Location: PACU  Anesthesia Type:MAC  Level of Consciousness: awake and alert   Airway & Oxygen Therapy: Patient Spontanous Breathing and Patient connected to face mask oxygen  Post-op Assessment: Report given to RN and Post -op Vital signs reviewed and stable  Post vital signs: Reviewed and stable  Last Vitals:  Vitals Value Taken Time  BP 122/72 11/11/21 2029  Temp    Pulse 105 11/11/21 2029  Resp 16 11/11/21 2029  SpO2 93 % 11/11/21 2029  Vitals shown include unvalidated device data.  Last Pain:  Vitals:   11/11/21 1838  TempSrc:   PainSc: 2       Patients Stated Pain Goal: 3 (72/53/66 4403)  Complications: No notable events documented.

## 2021-11-11 NOTE — Op Note (Signed)
Patient Name: [name] DOB: 02-15-1951  MRN: [id number]   Date of Service: 11/09/2021 - 11/11/2021  Surgeon: Dr. Lanae Crumbly, DPM Assistants: None Pre-operative Diagnosis:  Gangrene right third toe Ulcerations of right leg Post-operative Diagnosis:  Gangrene right third toe Inflammatory ulcerations of right leg, possible calciphylaxis or vasculitis Procedures:  1) partial amputation right third toe  2) full-thickness excisional debridement skin subcutaneous tissue 3.0 x 1.5 cm, 6.0 x 1.5 cm Pathology/Specimens: ID Type Source Tests Collected by Time Destination  A : Right leg wound culture  Wound Wound AEROBIC/ANAEROBIC CULTURE W GRAM STAIN (SURGICAL/DEEP WOUND) Criselda Peaches, DPM 11/11/2021 2006    Anesthesia: Monitored anesthesia care with local Hemostasis: * No tourniquets in log * Estimated Blood Loss: Less than 10 cc Materials:  Implant Name Type Inv. Item Serial No. Manufacturer Lot No. LRB No. Used Action  MICROMATRIX '500MG'$  - [id number] Tissue MICROMATRIX '500MG'$  [id number] [id number] Right 1 Implanted  MATRIX WOUND 3-LAYER 5X5 - [id number] Tissue MATRIX WOUND 3-LAYER 5X5 [id number] ACELL [id number] Right 1 Implanted   Medications: 30 cc 0.5% Marcaine plain Complications: None  Indications for Procedure:  This is a 71 y.o. female with a history of recurrent ulcerations of the right lower extremity recurrent cellulitis and peripheral arterial disease with gangrene of the right third toe.  Amputation and debridement was recommended.   Procedure in Detail: Patient was identified in pre-operative holding area. Formal consent was signed and the right lower extremity was marked. Patient was brought back to the operating room. Anesthesia was induced. The extremity was prepped and draped in the usual sterile fashion. Timeout was taken to confirm patient name, laterality, and procedure prior to incision.   Attention was then directed to the right foot where a fishmouth incision was  made over the third toe proximal phalanx.  This carried deep to the level of bone.  Hemostasis was achieved and the bone was cut with a bone cutting forceps.  The bone and distal soft tissue attachments of the toe were sent to pathology.  The wound was irrigated and the toe was closed with 3-0 Monocryl and 3-0 nylon and skin staples.  I then directed my attention to the proximal leg where multiple ulcerations were present.  These appeared to be necrotic and extending through subcutaneous tissue to the level of the deep fascia.  Full-thickness excisional debridement with a scalpel and curette was then completed, this measured 6.0 x 1.5 cm for the right distal ulceration in the proximal medial ulceration was a 3.0 x 1.5 cm.  The ulcerations were deeper and more serous than previously anticipated.  I suspect that the appearance was consistent with either a vasculitis or calciphylaxis.  A small symmetrical skin biopsy wedge was taken and sent to pathology for analysis to biopsy the wound.  Once all nonviable tissue had been excised, the wound was thoroughly irrigated with saline and micro matrix collagen powder and ACell collagen membrane was then inset into position and stapled in place with Xeroform and surgically.  Dry sterile dressings were then applied.Patient tolerated the procedure well.   Disposition: Following a period of post-operative monitoring, patient will be transferred to the floor.

## 2021-11-11 NOTE — Anesthesia Postprocedure Evaluation (Signed)
Anesthesia Post Note  Patient: [name]  Procedure(s) Performed: AMPUTATION TOE (Right: Toe) DEBRIDEMENT WOUND (Right)     Patient location during evaluation: PACU Anesthesia Type: MAC Level of consciousness: awake and alert Pain management: pain level controlled Vital Signs Assessment: post-procedure vital signs reviewed and stable Respiratory status: spontaneous breathing, nonlabored ventilation and respiratory function stable Cardiovascular status: stable and blood pressure returned to baseline Postop Assessment: no apparent nausea or vomiting Anesthetic complications: no   No notable events documented.  Last Vitals:  Vitals:   11/11/21 2030 11/11/21 2045  BP: 122/72 137/90  Pulse: (!) 105 98  Resp: 16 14  Temp: 36.8 C 36.7 C  SpO2: 93% 96%    Last Pain:  Vitals:   11/11/21 2045  TempSrc:   PainSc: 0-No pain                 Santa Lighter

## 2021-11-11 NOTE — Anesthesia Procedure Notes (Signed)
Procedure Name: MAC Date/Time: 11/11/2021 7:37 PM  Performed by: Reece Agar, CRNAPre-anesthesia Checklist: Patient identified, Emergency Drugs available, Suction available and Patient being monitored Patient Re-evaluated:Patient Re-evaluated prior to induction Oxygen Delivery Method: Simple face mask

## 2021-11-11 NOTE — Consult Note (Addendum)
[facility] for Infectious Disease  Date of Admission:  11/09/2021   Total days of inpatient antibiotics 2  Principal Problem:   Gangrene of toe of right foot ([place]) Active Problems:   PAD (peripheral artery disease) (HCC)   Multiple right leg ulcer with erythema   Anxiety, depression and insomnia   Essential hypertension   Normocytic anemia   Hypomagnesemia   Hyponatremia          Assessment: 71 year old female with recent admission for right lower extremity cellulitis and right third toe gangrene transferred from outside hospital for right third toe gangrene.   #Rt 3rd toe gangrene, failed outpatient therapy #RLE erythema -She was recently admitted to [facility] in 5/16 - 5/20 for right lower extremity cellulitis and right third toe gangrene.  Podiatry consulted did did not see signs of acute infection at that point and plan to follow-up outpatient.  Patient was discharged on Bactrim x2 weeks. -Presented to [facility] due to antibiotic intolerance on 6/3 she developed N/V and  noted to have RLE cellulitis. MRI showed  soft tissue ulceration at tip of 3rd toe. Placed back on vanc. ID consulted and recommended surgical consultation transfer to [facility]  -I spoke with pt and her daughter, they report RLE erythema has been present for months, and is unchanged.  -Seen by Vascular,  and pt is optimized from vascular standpoint Recommendations:  -Ok to continue vancomycin.  Not entirely convinced that she has cellulitis as the erythema has been unchanged for at least the past 3 months.  She noted being on and off antibiotics since January, 2023. Given dry gangrene would continue antibiotics. -Plan for OR today for partial vs total 3rd toe amputation/2nd toe and lower leg debridement, possible dermal skin substitute (please obtain Cx) -Agree with Derm referral given extent of wounds.  Microbiology:   Antibiotics: Bactrim prior to hospitalization Vancomycin  6/06-p    SUBJECTIVE: Afebrile overnight. No new complaints.   Review of Systems: Review of Systems  All other systems reviewed and are negative.    Scheduled Meds:  aspirin EC  81 mg Oral Daily   atorvastatin  40 mg Oral QHS   clopidogrel  75 mg Oral Daily   enoxaparin (LOVENOX) injection  40 mg Subcutaneous Q24H   losartan  100 mg Oral Daily   [START ON 11/12/2021] pantoprazole  40 mg Oral Daily   raloxifene  60 mg Oral Daily   senna-docusate  2 tablet Oral QHS   Continuous Infusions:  dextrose 5% lactated ringers 75 mL/hr at 11/11/21 1024   vancomycin 1,000 mg (11/10/21 2250)   PRN Meds:.acetaminophen, HYDROmorphone (DILAUDID) injection, melatonin, oxyCODONE, polyethylene glycol, prochlorperazine Allergies  Allergen Reactions   Latex Itching   Levofloxacin Other (See Comments)    Possible tendon rupture   Morphine And Related Nausea Only    OBJECTIVE: Vitals:   11/10/21 1620 11/10/21 2113 11/11/21 0418 11/11/21 0906  BP: 129/71 131/80 135/82 (!) 148/83  Pulse: (!) 101 93 92 87  Resp: '18 18 17 18  '$ Temp: 98.4 F (36.9 C) 98.3 F (36.8 C) 98.4 F (36.9 C) 98.2 F (36.8 C)  TempSrc: Oral Oral Oral Oral  SpO2: 100% 97% 99% 98%  Weight:       Body mass index is 24.8 kg/m.  Physical Exam Constitutional:      Appearance: Normal appearance.  HENT:     Head: Normocephalic and atraumatic.     Right Ear: Tympanic membrane normal.  Left Ear: Tympanic membrane normal.     Nose: Nose normal.     Mouth/Throat:     Mouth: Mucous membranes are moist.  Eyes:     Extraocular Movements: Extraocular movements intact.     Conjunctiva/sclera: Conjunctivae normal.     Pupils: Pupils are equal, round, and reactive to light.  Cardiovascular:     Rate and Rhythm: Normal rate and regular rhythm.     Heart sounds: No murmur heard.    No friction rub. No gallop.  Pulmonary:     Effort: Pulmonary effort is normal.     Breath sounds: Normal breath sounds.  Abdominal:      General: Abdomen is flat.     Palpations: Abdomen is soft.  Musculoskeletal:        General: Normal range of motion.  Skin:    General: Skin is warm and dry.     Comments: Right 3rd toe gangrene Rt leg wounds roofed Erythema to mid leg, unchanged.   Neurological:     General: No focal deficit present.     Mental Status: She is alert and oriented to person, place, and time.  Psychiatric:        Mood and Affect: Mood normal.       Lab Results Lab Results  Component Value Date   WBC 8.1 11/11/2021   HGB 7.9 (L) 11/11/2021   HCT 24.7 (L) 11/11/2021   MCV 85.8 11/11/2021   PLT 521 (H) 11/11/2021    Lab Results  Component Value Date   CREATININE 0.62 11/11/2021   BUN 9 11/11/2021   NA 136 11/11/2021   K 4.0 11/11/2021   CL 107 11/11/2021   CO2 23 11/11/2021    Lab Results  Component Value Date   ALT 13 11/10/2021   AST 14 (L) 11/10/2021   ALKPHOS 92 11/10/2021   BILITOT 0.5 11/10/2021        Laurice Record, [facility] 11/11/2021, 2:35 PM

## 2021-11-11 NOTE — H&P (Signed)
Anesthesia H&P Update: History and Physical Exam reviewed; patient is OK for planned anesthetic and procedure. ? ?

## 2021-11-11 NOTE — Anesthesia Preprocedure Evaluation (Addendum)
Anesthesia Evaluation  Patient identified by MRN, date of birth, ID band Patient awake    Reviewed: Allergy & Precautions, NPO status , Patient's Chart, lab work & pertinent test results  History of Anesthesia Complications (+) PONV and history of anesthetic complications  Airway Mallampati: II  TM Distance: >3 FB Neck ROM: Full    Dental  (+) Teeth Intact, Dental Advisory Given, Caps   Pulmonary asthma ,    Pulmonary exam normal breath sounds clear to auscultation       Cardiovascular hypertension, + Peripheral Vascular Disease  Normal cardiovascular exam+ Valvular Problems/Murmurs MVP  Rhythm:Regular Rate:Normal     Neuro/Psych PSYCHIATRIC DISORDERS Anxiety Depression negative neurological ROS     GI/Hepatic Neg liver ROS, hiatal hernia, GERD  ,  Endo/Other  negative endocrine ROS  Renal/GU negative Renal ROS     Musculoskeletal  (+) Arthritis , ulcers leg, dry gangrene   Abdominal   Peds  Hematology  (+) Blood dyscrasia (Plavix), anemia ,   Anesthesia Other Findings Day of surgery medications reviewed with the patient.  Reproductive/Obstetrics                            Anesthesia Physical Anesthesia Plan  ASA: 3  Anesthesia Plan: MAC   Post-op Pain Management: Tylenol PO (pre-op)*   Induction: Intravenous  PONV Risk Score and Plan: 3 and Dexamethasone, Ondansetron, Treatment may vary due to age or medical condition and TIVA  Airway Management Planned: Natural Airway and Nasal Cannula  Additional Equipment:   Intra-op Plan:   Post-operative Plan:   Informed Consent: I have reviewed the patients History and Physical, chart, labs and discussed the procedure including the risks, benefits and alternatives for the proposed anesthesia with the patient or authorized representative who has indicated his/her understanding and acceptance.     Dental advisory given  Plan  Discussed with: CRNA  Anesthesia Plan Comments:        Anesthesia Quick Evaluation

## 2021-11-11 NOTE — Progress Notes (Signed)
PROGRESS NOTE  [name] ZOX:[id number] DOB: March 04, 1951   PCP: [name], MD  Patient is from: Home.  DOA: 11/09/2021 LOS: 2  Chief complaints No chief complaint on file.    Brief Narrative / Interim history: 71 year old F with PMH of PAD with chronic right third toe ulcer s/p right femoral stent in 08/2021, HTN, HLD, anxiety, depression, insomnia, breast cancer and recent hospitalization from 5/16-5/20 for right third toe gangrene and cellulitis, admitted to [facility] with worsening right lower extremity swelling, pain and redness.  Patient was discharged on p.o. antibiotics that she did not tolerated due to nausea and vomiting after discharge.  Patient was started on IV vancomycin.  Infectious disease was consulted at [facility] and recommended surgical consultation, and patient was transferred here.  On arrival here, vitals and labs stable.  ID, vascular surgery and podiatry consulted.  Patient was started on IV vancomycin.  Vascular surgery recommends podiatry evaluation.  ID recommends continuing vancomycin for now.  Plan for OR with podiatry later today.    Subjective: Seen and examined earlier this morning.  She complains nausea and dry heaving.  She reports acid reflux.  No emesis or abdominal pain.  She denies diarrhea or UTI symptoms.  Objective: Vitals:   11/10/21 1620 11/10/21 2113 11/11/21 0418 11/11/21 0906  BP: 129/71 131/80 135/82 (!) 148/83  Pulse: (!) 101 93 92 87  Resp: '18 18 17 18  '$ Temp: 98.4 F (36.9 C) 98.3 F (36.8 C) 98.4 F (36.9 C) 98.2 F (36.8 C)  TempSrc: Oral Oral Oral Oral  SpO2: 100% 97% 99% 98%  Weight:        Examination:  GENERAL: No apparent distress.  Nontoxic. HEENT: MMM.  Vision and hearing grossly intact.  NECK: Supple.  No apparent JVD.  RESP:  No IWOB.  Fair aeration bilaterally. CVS:  RRR. Heart sounds normal.  ABD/GI/GU: BS+. Abd soft, NTND.  MSK/EXT:  Moves extremities. RLE erythema with scattered ulceration and  dry gangrene in the right third toe.  Erythema seems to have improved.  2+ DP pulses bilaterally.  SKIN: no apparent skin lesion or wound NEURO: Awake and alert. Oriented appropriately.  No apparent focal neuro deficit. PSYCH: Calm. Normal affect.   Procedures:  None  Microbiology summarized: None  Assessment and Plan: * Gangrene of toe of right foot [facility]) Patient had right femoral stenting 08/2021.  She seems to have good DP and PT pulses in right leg.  She has erythema and scattered ulceration in lower extremity.  No apparent drainage.  Erythema seems to be improving. -Appreciate input by ID and vascular surgery -Plan for OR with podiatry today. -Continue IV vancomycin for now  Multiple right leg ulcer with erythema Seems chronic.  No purulent drainage.  She has lower extremity erythema.  Seems erythema is receding.  Has no fever or leukocytosis either. -Antibiotics as above -Appreciate help by wound care  PAD (peripheral artery disease) (HCC) S/p right femoral stent in 08/2021. -Continue aspirin-increase statin to 40 mg daily  Hyponatremia and hypomagnesemia Resolved.  Hyponatremia Relatively stable. -Continue monitoring  Hypomagnesemia Mg 1.4.  IV magnesium sulfate 4 g x 1 -Continue monitoring  Normocytic anemia Recent Labs    08/25/21 0837 10/19/21 1342 10/20/21 0242 10/21/21 0055 11/09/21 2034 11/10/21 0233 11/11/21 0204  HGB 11.9* 10.5* 8.9* 8.8* 9.3* 7.9* 7.9*  Gradual drop in Hgb.  Partly dilutional from IV fluid.  Patient denies melena or hematochezia.  Anemia panel with iron deficiency. -Monitor H&H -We  will give IV iron prior to discharge   Essential hypertension Normotensive.  Continue home losartan  Anxiety, depression and insomnia Stable.  On significant dose of Elavil. -Continue home Elavil but concerned about risk of adverse effect given age    DVT prophylaxis:  enoxaparin (LOVENOX) injection 40 mg Start: 11/09/21 2200  Code Status:  Full code Family Communication: None at bedside. Level of care: Med-Surg Status is: Inpatient Remains inpatient appropriate because: Toe gangrene and foot pain and possible infection   Final disposition: TBD Consultants:  Infectious disease Vascular surgery Podiatry  Sch Meds:  Scheduled Meds:  aspirin EC  81 mg Oral Daily   atorvastatin  40 mg Oral QHS   clopidogrel  75 mg Oral Daily   enoxaparin (LOVENOX) injection  40 mg Subcutaneous Q24H   losartan  100 mg Oral Daily   [START ON 11/12/2021] pantoprazole  40 mg Oral Daily   raloxifene  60 mg Oral Daily   senna-docusate  2 tablet Oral QHS   Continuous Infusions:  dextrose 5% lactated ringers 75 mL/hr at 11/11/21 1024   vancomycin 1,000 mg (11/10/21 2250)   PRN Meds:.acetaminophen, HYDROmorphone (DILAUDID) injection, melatonin, oxyCODONE, polyethylene glycol, prochlorperazine  Antimicrobials: Anti-infectives (From admission, onward)    Start     Dose/Rate Route Frequency Ordered Stop   11/10/21 2200  vancomycin (VANCOCIN) IVPB 1000 mg/200 mL premix        1,000 mg 200 mL/hr over 60 Minutes Intravenous Every 24 hours 11/09/21 2130     11/09/21 2215  vancomycin (VANCOREADY) IVPB 1250 mg/250 mL        1,250 mg 166.7 mL/hr over 90 Minutes Intravenous  Once 11/09/21 2127 11/10/21 0043        I have personally reviewed the following labs and images: CBC: Recent Labs  Lab 11/09/21 2034 11/10/21 0233 11/11/21 0204  WBC 8.6 8.3 8.1  NEUTROABS 5.9 5.4 5.2  HGB 9.3* 7.9* 7.9*  HCT 29.6* 25.1* 24.7*  MCV 86.3 86.0 85.8  PLT 567* 533* 521*   BMP &GFR Recent Labs  Lab 11/09/21 2034 11/10/21 0233 11/11/21 0204  NA 136 134* 136  K 3.5 3.7 4.0  CL 102 102 107  CO2 '26 22 23  '$ GLUCOSE 138* 132* 122*  BUN '14 14 9  '$ CREATININE 0.83 0.69 0.62  CALCIUM 8.8* 8.4* 8.6*  MG 1.4* 1.4* 2.2  PHOS 3.1 3.7 3.5   Estimated Creatinine Clearance: 57.8 mL/min (by C-G formula based on SCr of 0.62 mg/dL). Liver &  Pancreas: Recent Labs  Lab 11/09/21 2034 11/10/21 0233 11/11/21 0204  AST 16 14*  --   ALT 12 13  --   ALKPHOS 109 92  --   BILITOT 0.1* 0.5  --   PROT 5.7* 5.1*  --   ALBUMIN 2.7* 2.5* 2.5*   No results for input(s): "LIPASE", "AMYLASE" in the last 168 hours. No results for input(s): "AMMONIA" in the last 168 hours. Diabetic: No results for input(s): "HGBA1C" in the last 72 hours. No results for input(s): "GLUCAP" in the last 168 hours. Cardiac Enzymes: No results for input(s): "CKTOTAL", "CKMB", "CKMBINDEX", "TROPONINI" in the last 168 hours. No results for input(s): "PROBNP" in the last 8760 hours. Coagulation Profile: No results for input(s): "INR", "PROTIME" in the last 168 hours. Thyroid Function Tests: No results for input(s): "TSH", "T4TOTAL", "FREET4", "T3FREE", "THYROIDAB" in the last 72 hours. Lipid Profile: No results for input(s): "CHOL", "HDL", "LDLCALC", "TRIG", "CHOLHDL", "LDLDIRECT" in the last 72 hours. Anemia Panel: Recent Labs  11/11/21 0204  VITAMINB12 1,329*  FOLATE 10.9  FERRITIN 17  TIBC 297  IRON 34  RETICCTPCT 1.9   Urine analysis: No results found for: "COLORURINE", "APPEARANCEUR", "LABSPEC", "PHURINE", "GLUCOSEU", "HGBUR", "BILIRUBINUR", "KETONESUR", "PROTEINUR", "UROBILINOGEN", "NITRITE", "LEUKOCYTESUR" Sepsis Labs: Invalid input(s): "PROCALCITONIN", "LACTICIDVEN"  Microbiology: No results found for this or any previous visit (from the past 240 hour(s)).  Radiology Studies: No results found.    Corrin Hingle T. [place]  If 7PM-7AM, please contact night-coverage [email or web address] 11/11/2021, 4:47 PM

## 2021-11-12 ENCOUNTER — Encounter (HOSPITAL_COMMUNITY): Payer: Self-pay | Admitting: Podiatry

## 2021-11-12 DIAGNOSIS — D509 Iron deficiency anemia, unspecified: Secondary | ICD-10-CM

## 2021-11-12 DIAGNOSIS — F32A Depression, unspecified: Secondary | ICD-10-CM

## 2021-11-12 DIAGNOSIS — E878 Other disorders of electrolyte and fluid balance, not elsewhere classified: Secondary | ICD-10-CM

## 2021-11-12 DIAGNOSIS — F419 Anxiety disorder, unspecified: Secondary | ICD-10-CM

## 2021-11-12 DIAGNOSIS — E871 Hypo-osmolality and hyponatremia: Secondary | ICD-10-CM

## 2021-11-12 DIAGNOSIS — L97921 Non-pressure chronic ulcer of unspecified part of left lower leg limited to breakdown of skin: Secondary | ICD-10-CM

## 2021-11-12 DIAGNOSIS — I739 Peripheral vascular disease, unspecified: Secondary | ICD-10-CM

## 2021-11-12 DIAGNOSIS — L97912 Non-pressure chronic ulcer of unspecified part of right lower leg with fat layer exposed: Secondary | ICD-10-CM

## 2021-11-12 DIAGNOSIS — I10 Essential (primary) hypertension: Secondary | ICD-10-CM

## 2021-11-12 LAB — CBC
HCT: 25.9 % — ABNORMAL LOW (ref 36.0–46.0)
Hemoglobin: 8.1 g/dL — ABNORMAL LOW (ref 12.0–15.0)
MCH: 26.7 pg (ref 26.0–34.0)
MCHC: 31.3 g/dL (ref 30.0–36.0)
MCV: 85.5 fL (ref 80.0–100.0)
Platelets: 559 10*3/uL — ABNORMAL HIGH (ref 150–400)
RBC: 3.03 MIL/uL — ABNORMAL LOW (ref 3.87–5.11)
RDW: 17 % — ABNORMAL HIGH (ref 11.5–15.5)
WBC: 9.5 10*3/uL (ref 4.0–10.5)
nRBC: 0 % (ref 0.0–0.2)

## 2021-11-12 LAB — MAGNESIUM: Magnesium: 1.9 mg/dL (ref 1.7–2.4)

## 2021-11-12 LAB — RENAL FUNCTION PANEL
Albumin: 2.7 g/dL — ABNORMAL LOW (ref 3.5–5.0)
Anion gap: 7 (ref 5–15)
BUN: 6 mg/dL — ABNORMAL LOW (ref 8–23)
CO2: 22 mmol/L (ref 22–32)
Calcium: 8.6 mg/dL — ABNORMAL LOW (ref 8.9–10.3)
Chloride: 104 mmol/L (ref 98–111)
Creatinine, Ser: 0.68 mg/dL (ref 0.44–1.00)
GFR, Estimated: >60 mL/min (ref >60)
Glucose, Bld: 123 mg/dL — ABNORMAL HIGH (ref 70–99)
Phosphorus: 4.1 mg/dL (ref 2.5–4.6)
Potassium: 4 mmol/L (ref 3.5–5.1)
Sodium: 133 mmol/L — ABNORMAL LOW (ref 135–145)

## 2021-11-12 MED ORDER — SENNOSIDES-DOCUSATE SODIUM 8.6-50 MG PO TABS: 2.0000 | ORAL_TABLET | Freq: Every day | ORAL | Status: AC

## 2021-11-12 MED ORDER — OXYCODONE HCL 5 MG PO TABS: 5.0000 mg | ORAL_TABLET | Freq: Three times a day (TID) | ORAL | 0 refills | Status: AC | PRN

## 2021-11-12 MED ORDER — FUROSEMIDE 40 MG PO TABS: 40.0000 mg | ORAL_TABLET | Freq: Every day | ORAL | 1 refills | Status: AC

## 2021-11-12 MED ORDER — DOXYCYCLINE HYCLATE 100 MG PO TABS
100.0000 mg | ORAL_TABLET | Freq: Two times a day (BID) | ORAL | Status: DC
Start: 2021-11-12 — End: 2021-11-12

## 2021-11-12 MED ORDER — SODIUM CHLORIDE 0.9 % IV SOLN
250.0000 mg | Freq: Once | INTRAVENOUS | Status: AC
  Administered 2021-11-12: 250 mg via INTRAVENOUS
  Filled 2021-11-12: qty 20

## 2021-11-12 MED ORDER — LOSARTAN POTASSIUM 100 MG PO TABS: 100.0000 mg | ORAL_TABLET | Freq: Every day | ORAL | 1 refills | Status: AC

## 2021-11-12 MED ORDER — DOXYCYCLINE HYCLATE 100 MG PO CAPS: 100.0000 mg | ORAL_CAPSULE | Freq: Two times a day (BID) | ORAL | 0 refills | Status: AC

## 2021-11-12 MED ORDER — ACETAMINOPHEN 500 MG PO TABS: ORAL_TABLET | ORAL | 0 refills | Status: AC

## 2021-11-12 NOTE — Evaluation (Signed)
Occupational Therapy Evaluation Patient Details Name: [name] MRN: [id number] DOB: 09/27/1950 Today's Date: 11/12/2021   History of Present Illness Pt is a 71 y/o female admitted from [facility] secondary to R 3rd toe gangrene. Pt is s/p 3rd R toe partial amputation. PMH includes HTN and breast cancer.   Clinical Impression   Prior to this admission, patient living with her son and granddaughter. Patient endorsed she needed help with IADLs due to weakness, but was gaining more independence since previous hospital admission. Patient endorses that her granddaughter oversees her medications, but has allowed patient to participate more recently in medication management. Currently, patient presents with decreased activity tolerance, weakness, decreased knowledge of precautions and post op shoe use and frequent bouts of tangential speech. Patient min guard for basic ADLs, but requires cues to maintain attention to task. OT recommending outpatient OT at discharge, and recommending BSC as patient has had frequent falls when attempting to reach the bathroom at night. OT will continue to follow acutely.      Recommendations for follow up therapy are one component of a multi-disciplinary discharge planning process, led by the attending physician.  Recommendations may be updated based on patient status, additional functional criteria and insurance authorization.   Follow Up Recommendations  Outpatient OT    Assistance Recommended at Discharge Frequent or constant Supervision/Assistance  Patient can return home with the following A little help with walking and/or transfers;A little help with bathing/dressing/bathroom;Assistance with cooking/housework;Direct supervision/assist for medications management;Direct supervision/assist for financial management;Help with stairs or ramp for entrance;Assist for transportation    Functional Status Assessment  Patient has had a recent decline in their  functional status and demonstrates the ability to make significant improvements in function in a reasonable and predictable amount of time.  Equipment Recommendations  BSC/3in1    Recommendations for Other Services       Precautions / Restrictions Precautions Precautions: Fall Required Braces or Orthoses: Other Brace Other Brace: post op shoe Restrictions Weight Bearing Restrictions: Yes RLE Weight Bearing: Weight bearing as tolerated      Mobility Bed Mobility               General bed mobility comments: patient up in chair upon OT evaluation    Transfers Overall transfer level: Needs assistance Equipment used: Rolling walker (2 wheels) Transfers: Sit to/from Stand Sit to Stand: Min guard           General transfer comment: Min guard for safety. Cues for hand placement      Balance Overall balance assessment: Needs assistance Sitting-balance support: No upper extremity supported Sitting balance-Leahy Scale: Fair     Standing balance support: Bilateral upper extremity supported Standing balance-Leahy Scale: Poor Standing balance comment: Reliant on BUE support                           ADL either performed or assessed with clinical judgement   ADL Overall ADL's : Needs assistance/impaired Eating/Feeding: Set up;Sitting   Grooming: Set up;Sitting   Upper Body Bathing: Set up;Sitting   Lower Body Bathing: Minimal assistance;Sitting/lateral leans;Sit to/from stand   Upper Body Dressing : Set up;Sitting   Lower Body Dressing: Minimal assistance;Sitting/lateral leans;Sit to/from stand   Toilet Transfer: Min guard;Cueing for safety;Cueing for sequencing;Ambulation;Rolling walker (2 wheels)   Toileting- Clothing Manipulation and Hygiene: Min guard;Sitting/lateral lean;Sit to/from stand       Functional mobility during ADLs: Min guard;Cueing for sequencing;Cueing for safety;Rolling walker (2  wheels) General ADL Comments: Patient presents  with decreased activity tolerance, weakness, decreased knowledge of precautions and post op shoe use and frequent bouts of tangential speech     Vision Baseline Vision/History: 1 Wears glasses Ability to See in Adequate Light: 0 Adequate Patient Visual Report: No change from baseline Additional Comments: Quick vision assessment completed at patient endorsed blurring of vision for the last few months, able to complete functional tasks and has an upcoming eye appointment     Perception     Praxis      Pertinent Vitals/Pain Pain Assessment Pain Assessment: Faces Faces Pain Scale: Hurts even more Pain Location: R foot Pain Descriptors / Indicators: Guarding, Grimacing Pain Intervention(s): Limited activity within patient's tolerance, Monitored during session, Repositioned     Hand Dominance Right   Extremity/Trunk Assessment Upper Extremity Assessment Upper Extremity Assessment: Generalized weakness   Lower Extremity Assessment Lower Extremity Assessment: Defer to PT evaluation RLE Deficits / Details: s/p R 3rd toe partial amputation   Cervical / Trunk Assessment Cervical / Trunk Assessment: Normal   Communication Communication Communication: No difficulties   Cognition Arousal/Alertness: Awake/alert Behavior During Therapy: WFL for tasks assessed/performed Overall Cognitive Status: No family/caregiver present to determine baseline cognitive functioning                                 General Comments: Pt very tangential throughout. Cues for safety throughout, needed reiteration of precautions and post op shoe use despite having PT evaluation within the same hour, unable to dual task in evaluation with OT     General Comments       Exercises     Shoulder Instructions      Home Living Family/patient expects to be discharged to:: Private residence Living Arrangements: Children Available Help at Discharge: Family Type of Home: House Home Access: Stairs  to enter Technical brewer of Steps: 4-5 Entrance Stairs-Rails: Right;Left Home Layout: One level     Bathroom Shower/Tub: Occupational psychologist: Handicapped height     [place]: Conservation officer, nature (2 wheels);Shower seat;Toilet riser;Other (comment) (grab bars on toilet)   Additional Comments: Planning on staying with her daughter      Prior Functioning/Environment Prior Level of Function : Independent/Modified Independent                        OT Problem List: Decreased strength;Decreased range of motion;Decreased activity tolerance;Impaired balance (sitting and/or standing);Decreased coordination;Decreased cognition;Decreased safety awareness;Decreased knowledge of use of DME or AE;Decreased knowledge of precautions;Pain      OT Treatment/Interventions: Self-care/ADL training;Energy conservation;DME and/or AE instruction;Manual therapy;Therapeutic activities;Cognitive remediation/compensation;Patient/family education;Balance training    OT Goals(Current goals can be found in the care plan section) Acute Rehab OT Goals Patient Stated Goal: to get my balance back OT Goal Formulation: With patient Time For Goal Achievement: 11/26/21 Potential to Achieve Goals: Good ADL Goals Pt Will Perform Lower Body Bathing: Independently;sit to/from stand;sitting/lateral leans;with adaptive equipment Pt Will Perform Lower Body Dressing: Independently;with adaptive equipment;sitting/lateral leans;sit to/from stand Pt Will Transfer to Toilet: Independently;ambulating Additional ADL Goal #1: Patient will be able to consistently state precautions and use of R shoe to protect foot for increased adherence and independence at home. Additional ADL Goal #2: Patient will be able to complete trail making tasks, with emphasis on dual tasking, with no cues to rorient or maintain attention to be able to return to prior level  of independence.  OT Frequency: Min 2X/week     Co-evaluation              AM-PAC OT "6 Clicks" Daily Activity     Outcome Measure Help from another person eating meals?: A Little Help from another person taking care of personal grooming?: A Little Help from another person toileting, which includes using toliet, bedpan, or urinal?: A Little Help from another person bathing (including washing, rinsing, drying)?: A Little Help from another person to put on and taking off regular upper body clothing?: A Little Help from another person to put on and taking off regular lower body clothing?: A Little 6 Click Score: 18   End of Session Equipment Utilized During Treatment: Rolling walker (2 wheels);Other (comment) (post op shoe) Nurse Communication: Mobility status  Activity Tolerance: Patient tolerated treatment well Patient left: in chair;with call bell/phone within reach  OT Visit Diagnosis: Unsteadiness on feet (R26.81);Other abnormalities of gait and mobility (R26.89);Repeated falls (R29.6);Muscle weakness (generalized) (M62.81);History of falling (Z91.81);Pain Pain - Right/Left: Right Pain - part of body: Ankle and joints of foot                Time: 1011-1027 OT Time Calculation (min): 16 min Charges:  OT General Charges $OT Visit: 1 Visit OT Evaluation $OT Eval Moderate Complexity: 1 Mod  Corinne Ports E. Daksh Coates, OTR/L Acute Rehabilitation Services [phone] [place] 11/12/2021, 10:45 AM

## 2021-11-12 NOTE — TOC Initial Note (Addendum)
Transition of Care [facility]) - Initial/Assessment Note    Patient Details  Name: [name] MRN: [id number] Date of Birth: Oct 06, 1950  Transition of [facility]) CM/SW Contact:    Marilu Favre, RN Phone Number: 11/12/2021, 10:30 AM  Clinical Narrative:                  Patient from home with son , grand daughter and grand daughter's boyfriend. At discharge patient plans to stay with her daughter for a few days. Daughter also lives in [place].   PT recommendations to continue OP PT. Patient states she was set up to go to [place], they just called her this morning with initial appointment for July 13, she would like to go somewhere else.   Discussed different locations. Patient would like [facility] at [place] phone [phone] fax [phone]. NCM called same from patient's room spoke to [place] they can see patient June 21 , 2023 at 1100 am. Patient accepted appointment . Information placed on AVS. [place] will need MD referral form signed and clinicals faxed to her. Secured chatted MD he will sign referral. Referral for OP PT and OP OT faxed   Called patient's PCP and scheduled appointment for 11/23/21 at 3:30PM, placed on AVS.   Patient has all DME at home. [employer] daughter can provide transportation to appointments.   OT updated recommendations for 3 in 1 ordered with [employer]  Expected Discharge Plan: Home/Self Care Barriers to Discharge: No Barriers Identified   Patient Goals and CMS Choice Patient states their goals for this hospitalization and ongoing recovery are:: to return to home CMS Medicare.gov Compare Post Acute Care list provided to:: Patient Choice offered to / list presented to : Patient  Expected Discharge Plan and Services Expected Discharge Plan: Home/Self Care     Post Acute Care Choice:  (OP PT) Living arrangements for the past 2 months: Single Family Home Expected Discharge Date: 11/12/21                DME Arranged: N/A         HH Arranged: NA          Prior Living Arrangements/Services Living arrangements for the past 2 months: Single Family Home Lives with:: Adult Children Patient language and need for interpreter reviewed:: Yes Do you feel safe going back to the place where you live?: Yes      Need for Family Participation in Patient Care: Yes (Comment) Care giver support system in place?: Yes (comment) Current home services: DME Criminal Activity/Legal Involvement Pertinent to Current Situation/Hospitalization: No - Comment as needed  Activities of Daily Living      Permission Sought/Granted   Permission granted to share information with : No              Emotional Assessment Appearance:: Appears stated age Attitude/Demeanor/Rapport: Engaged Affect (typically observed): Accepting Orientation: : Oriented to Self, Oriented to Place, Oriented to  Time, Oriented to Situation Alcohol / Substance Use: Not Applicable Psych Involvement: No (comment)  Admission diagnosis:  Cellulitis of right leg [L03.115] Gangrene of toe of right foot ([place]) [I96] Toe gangrene ([facility]) [I96] Patient Active Problem List   Diagnosis Date Noted   Hyponatremia and hypomagnesemia 11/11/2021   Multiple right leg ulcer with erythema 11/10/2021   Anxiety, depression and insomnia 11/10/2021   Essential hypertension 11/10/2021   Normocytic anemia 11/10/2021   Hypomagnesemia 11/10/2021  Hyponatremia 11/10/2021   PAD (peripheral artery disease) ([place]) 10/19/2021   Gangrene of toe of right foot ([place]) 10/19/2021   Idiopathic neuropathy 10/19/2021   Vitamin D deficiency 10/19/2021   Insomnia 10/19/2021   Depression 10/19/2021   PCP:  [name], MD Pharmacy:   [facility] [place], [address] AT [place] [address] [place] [address] Phone: [phone] Fax: [phone]  [facility] [place], [address] [place] [address] Phone: [phone] Fax: [phone]     Social Determinants of Health (SDOH) Interventions    Readmission Risk Interventions     No data to display

## 2021-11-12 NOTE — Evaluation (Signed)
Physical Therapy Evaluation Patient Details Name: Clarisse Rodriges MRN: [id number] DOB: 07-18-1950 Today's Date: 11/12/2021  History of Present Illness  Pt is a 71 y/o female admitted from [facility] secondary to R 3rd toe gangrene. Pt is s/p 3rd R toe partial amputation. PMH includes HTN and breast cancer.  Clinical Impression  Pt admitted secondary to problem above with deficits below. Pt requiring min guard to min A for gait and stair navigation this session. Required safety cues for safe use of RW and stair navigation. Pt reports she plans to stay with her daughter at d/c. Reports previous hospital she was at set her up with outpatient PT. Will continue to follow acutely.        Recommendations for follow up therapy are one component of a multi-disciplinary discharge planning process, led by the attending physician.  Recommendations may be updated based on patient status, additional functional criteria and insurance authorization.  Follow Up Recommendations Outpatient PT (Reports [facility] already set her up with outpatient PT)    Assistance Recommended at Discharge Intermittent Supervision/Assistance  Patient can return home with the following  A little help with bathing/dressing/bathroom;Help with stairs or ramp for entrance;Assist for transportation;Assistance with cooking/housework    Equipment Recommendations None recommended by PT  Recommendations for Other Services       Functional Status Assessment Patient has had a recent decline in their functional status and demonstrates the ability to make significant improvements in function in a reasonable and predictable amount of time.     Precautions / Restrictions Precautions Precautions: Fall Required Braces or Orthoses: Other Brace Other Brace: post op shoe Restrictions Weight Bearing Restrictions: Yes RLE Weight Bearing: Weight bearing as tolerated      Mobility  Bed Mobility Overal bed mobility: Modified  Independent                  Transfers Overall transfer level: Needs assistance Equipment used: Rolling walker (2 wheels) Transfers: Sit to/from Stand Sit to Stand: Min guard           General transfer comment: Min guard for safety. Cues for hand placement    Ambulation/Gait Ambulation/Gait assistance: Min guard Gait Distance (Feet): 150 Feet Assistive device: Rolling walker (2 wheels) Gait Pattern/deviations: Step-through pattern, Decreased stance time - right, Decreased stride length, Decreased weight shift to right, Antalgic Gait velocity: Decreased     General Gait Details: Slow, antalgic gait. Required safety cues for safe use of RW. Min guard for safety.  Stairs Stairs: Yes Stairs assistance: Min assist, Min guard Stair Management: One rail Right, Sideways, Step to pattern Number of Stairs: 4 General stair comments: Required cues for LE sequencing and hand placement using sideways technique. Initially requiring min A, but progressed to min guard A.  Wheelchair Mobility    Modified Rankin (Stroke Patients Only)       Balance Overall balance assessment: Needs assistance Sitting-balance support: No upper extremity supported Sitting balance-Leahy Scale: Fair     Standing balance support: Bilateral upper extremity supported Standing balance-Leahy Scale: Poor Standing balance comment: Reliant on BUE support                             Pertinent Vitals/Pain Pain Assessment Pain Assessment: Faces Faces Pain Scale: Hurts even more Pain Location: R foot Pain Descriptors / Indicators: Guarding, Grimacing Pain Intervention(s): Limited activity within patient's tolerance, Monitored during session, Repositioned    Home Living Family/patient expects to  be discharged to:: Private residence Living Arrangements: Children Available Help at Discharge: Family Type of Home: House Home Access: Stairs to enter Entrance Stairs-Rails:  Psychiatric nurse of Steps: 4-5   Home Layout: One level Home Equipment: Conservation officer, nature (2 wheels);Shower seat;Toilet riser;Other (comment) (grab bars on toilet) Additional Comments: Planning on staying with her daughter    Prior Function Prior Level of Function : Independent/Modified Independent                     Hand Dominance        Extremity/Trunk Assessment   Upper Extremity Assessment Upper Extremity Assessment: Defer to OT evaluation    Lower Extremity Assessment Lower Extremity Assessment: Generalized weakness;RLE deficits/detail RLE Deficits / Details: s/p R 3rd toe partial amputation    Cervical / Trunk Assessment Cervical / Trunk Assessment: Normal  Communication   Communication: No difficulties  Cognition Arousal/Alertness: Awake/alert Behavior During Therapy: WFL for tasks assessed/performed Overall Cognitive Status: No family/caregiver present to determine baseline cognitive functioning                                 General Comments: Pt very tangential throughout. Cues for safety throughout        General Comments      Exercises     Assessment/Plan    PT Assessment Patient needs continued PT services  PT Problem List Decreased strength;Decreased balance;Decreased activity tolerance;Decreased mobility;Decreased knowledge of use of DME;Decreased safety awareness;Decreased knowledge of precautions       PT Treatment Interventions DME instruction;Gait training;Stair training;Functional mobility training;Therapeutic activities;Therapeutic exercise;Balance training;Patient/family education    PT Goals (Current goals can be found in the Care Plan section)  Acute Rehab PT Goals Patient Stated Goal: to go home PT Goal Formulation: With patient Time For Goal Achievement: 11/26/21 Potential to Achieve Goals: Good    Frequency Min 3X/week     Co-evaluation               AM-PAC PT "6 Clicks" Mobility   Outcome Measure Help needed turning from your back to your side while in a flat bed without using bedrails?: None Help needed moving from lying on your back to sitting on the side of a flat bed without using bedrails?: None Help needed moving to and from a bed to a chair (including a wheelchair)?: A Little Help needed standing up from a chair using your arms (e.g., wheelchair or bedside chair)?: A Little Help needed to walk in hospital room?: A Little Help needed climbing 3-5 steps with a railing? : A Little 6 Click Score: 20    End of Session Equipment Utilized During Treatment: Gait belt Activity Tolerance: Patient tolerated treatment well Patient left: in chair;with call bell/phone within reach Nurse Communication: Mobility status PT Visit Diagnosis: Unsteadiness on feet (R26.81);Muscle weakness (generalized) (M62.81)    Time: 3382-5053 PT Time Calculation (min) (ACUTE ONLY): 23 min   Charges:   PT Evaluation $PT Eval Low Complexity: 1 Low PT Treatments $Gait Training: 8-22 mins        Lou Miner, DPT  Acute Rehabilitation Services  Office: [phone]   Rudean Hitt 11/12/2021, 10:14 AM

## 2021-11-12 NOTE — Progress Notes (Signed)
Patient discharged, educated on dressings changes and medications. IV removed and covered with gauze and tape. Patient had no questions. Patient taken down to main entrance by volunteers, for daughter to transport home.

## 2021-11-12 NOTE — Progress Notes (Signed)
[place] for Infectious Disease  Date of Admission:  11/09/2021   Total days of inpatient antibiotics 2  Principal Problem:   Gangrene of toe of right foot ([place]) Active Problems:   PAD (peripheral artery disease) (HCC)   Multiple right leg ulcer with erythema   Anxiety, depression and insomnia   Essential hypertension   Normocytic anemia   Hypomagnesemia   Hyponatremia   Hyponatremia and hypomagnesemia          Assessment: 71 year old female with recent admission for right lower extremity cellulitis and right third toe gangrene transferred from outside hospital for right third toe gangrene.   #Rt 3rd toe gangrene, failed outpatient therapy SP partial right 3rd toe amputaion #RLE erythema -She was recently admitted to [facility] in 5/16 - 5/20 for right lower extremity cellulitis and right third toe gangrene.  Podiatry consulted did did not see signs of acute infection at that point and plan to follow-up outpatient.  Patient was discharged on Bactrim x2 weeks. -Presented to [facility] due to antibiotic intolerance on 6/3 she developed N/V and  noted to have RLE cellulitis. MRI showed  soft tissue ulceration at tip of 3rd toe. Placed back on vanc. ID consulted and recommended surgical consultation transfer to [facility]  -I spoke with pt and her daughter, they report RLE erythema has been present for months, and is unchanged.  -Seen by Vascular,  and pt is optimized from vascular standpoint. Taken to OR  with podiatry on 6/8 and underwent partial 3rd toe amputation.    Recommendations: -D/C vancomycin -Start Doxycyline -Follow-up with ID on 6/20 and if Cx and path negative for infection will plan to stop antibiotics.  -Agree with Derm referral  -ID will sign off Microbiology:   Antibiotics: Bactrim prior to hospitalization Vancomycin 6/06-p    SUBJECTIVE: Afebrile overnight. No new complaint.s   Review of Systems: Review of Systems  All other systems  reviewed and are negative.    Scheduled Meds:  aspirin EC  81 mg Oral Daily   atorvastatin  40 mg Oral QHS   clopidogrel  75 mg Oral Daily   doxycycline  100 mg Oral Q12H   enoxaparin (LOVENOX) injection  40 mg Subcutaneous Q24H   losartan  100 mg Oral Daily   pantoprazole  40 mg Oral Daily   raloxifene  60 mg Oral Daily   senna-docusate  2 tablet Oral QHS   Continuous Infusions:  dextrose 5% lactated ringers 75 mL/hr at 11/12/21 0550   PRN Meds:.acetaminophen, HYDROmorphone (DILAUDID) injection, melatonin, oxyCODONE, polyethylene glycol, prochlorperazine Allergies  Allergen Reactions   Latex Itching   Levofloxacin Other (See Comments)    Possible tendon rupture   Morphine And Related Nausea Only    OBJECTIVE: Vitals:   11/12/21 0026 11/12/21 0500 11/12/21 0800 11/12/21 1307  BP: (!) 158/81 (!) 155/92 138/72 135/72  Pulse: 98 94 85 96  Resp: '16 18 18 18  '$ Temp: 98.7 F (37.1 C) 98.3 F (36.8 C) 97.9 F (36.6 C) 98.3 F (36.8 C)  TempSrc: Oral Oral Oral Oral  SpO2: 94% 96% 99% 97%  Weight:  66.3 kg    Height:       Body mass index is 25.89 kg/m.  Physical Exam Constitutional:      Appearance: Normal appearance.  HENT:     Head: Normocephalic and atraumatic.     Right Ear: Tympanic membrane normal.     Left Ear: Tympanic membrane normal.  Nose: Nose normal.     Mouth/Throat:     Mouth: Mucous membranes are moist.  Eyes:     Extraocular Movements: Extraocular movements intact.     Conjunctiva/sclera: Conjunctivae normal.     Pupils: Pupils are equal, round, and reactive to light.  Cardiovascular:     Rate and Rhythm: Normal rate and regular rhythm.     Heart sounds: No murmur heard.    No friction rub. No gallop.  Pulmonary:     Effort: Pulmonary effort is normal.     Breath sounds: Normal breath sounds.  Abdominal:     General: Abdomen is flat.     Palpations: Abdomen is soft.  Musculoskeletal:        General: Normal range of motion.  Skin:     General: Skin is warm and dry.     Comments: Right 3rd toe wound  Neurological:     General: No focal deficit present.     Mental Status: She is alert and oriented to person, place, and time.  Psychiatric:        Mood and Affect: Mood normal.       Lab Results Lab Results  Component Value Date   WBC 9.5 11/12/2021   HGB 8.1 (L) 11/12/2021   HCT 25.9 (L) 11/12/2021   MCV 85.5 11/12/2021   PLT 559 (H) 11/12/2021    Lab Results  Component Value Date   CREATININE 0.68 11/12/2021   BUN 6 (L) 11/12/2021   NA 133 (L) 11/12/2021   K 4.0 11/12/2021   CL 104 11/12/2021   CO2 22 11/12/2021    Lab Results  Component Value Date   ALT 13 11/10/2021   AST 14 (L) 11/10/2021   ALKPHOS 92 11/10/2021   BILITOT 0.5 11/10/2021        Laurice Record, [place] 11/12/2021, 1:12 PM

## 2021-11-12 NOTE — Care Management Important Message (Signed)
Important Message  Patient Details  Name: [name] MRN: [id number] Date of Birth: 1950/08/29   Medicare Important Message Given:  Yes     Lilley Hubble 11/12/2021, 2:22 PM

## 2021-11-12 NOTE — Discharge Summary (Signed)
Physician Discharge Summary  [name] XIP:[id number] DOB: 05-11-1951 DOA: 11/09/2021  PCP: [name], MD  Admit date: 11/09/2021 Discharge date: 11/12/2021 Admitted From: Home Disposition: Home Recommendations for Outpatient Follow-up:  Follow ups as below. Please obtain CBC/BMP/Mag at follow up Please follow up on the following pending results: Surgical tissue culture and pathology  Home Health: Outpatient referral to therapy Equipment/Devices: Not indicated  Discharge Condition: Stable CODE STATUS: Full code  Follow-up Information     [name], MD. Go today.   Specialty: Internal Medicine Why: November 23, 2021 at 3:30 pm Contact information: [address] [place] [address] [facility] up.   Why: November 24, 2021 at 1100 am Contact information: [facility] [place] phone [phone] course 71 year old F with PMH of PAD with chronic right third toe ulcer s/p right femoral stent in 08/2021, HTN, HLD, anxiety, depression, insomnia, breast cancer and recent hospitalization from 5/16-5/20 for right third toe gangrene and cellulitis, admitted to [facility] with worsening right lower extremity swelling, pain and redness.  Patient was discharged on p.o. antibiotics that she did not tolerated due to nausea and vomiting after discharge.  Patient was started on IV vancomycin.  Infectious disease was consulted at [facility] and recommended surgical consultation, and patient was transferred here.  On arrival here, vitals and labs stable.  ID, vascular surgery and podiatry consulted.  Patient was started on IV vancomycin.  Vascular surgery evaluated patient and felt her distal circulation is optimal, and recommended podiatry evaluation.  Podiatry consulted.  She underwent partial amputation of right third toe and full-thickness excisional  debridement of skin subcutaneous tissue in the right leg on 11/11/2021.  Patient received IV vancomycin in house with improvement in cellulitis.  She was cleared for discharge on p.o. doxycycline by podiatry and infectious disease.  Podiatry and infectious disease to arrange outpatient follow-up.  Referral for outpatient therapy ordered.    See individual problem list below for more on hospital course.  Problems addressed during this hospitalization Problem  Gangrene of Toe of Right Foot (Hcc)  Multiple right leg ulcer with erythema  Hyponatremia and hypomagnesemia  Iron Deficiency Anemia  Pad (Peripheral Artery Disease) (Hcc)  Anxiety, depression and insomnia  Essential Hypertension  Hyponatremia    Assessment and Plan: * Gangrene of toe of right foot [facility]) Patient had right femoral stenting 08/2021.  She seems to have good DP and PT pulses in right leg.  She has erythema and scattered ulceration in lower extremity.  No apparent drainage.  Erythema improved. -S/p partial amputation of right third toe by podiatry, Dr. Sherryle Lis on 6/8. -Vancomycin for 2 days in house.  Discharged on p.o. doxycycline for 8 more days per podiatry and ID -Podiatry to follow-up on tissue culture and pathology -Podiatry and ID to arrange outpatient follow-up. -WBAT on right leg.  Dressing change every other day.  Multiple right leg ulcer with erythema Seems chronic.  No purulent drainage.  She has lower extremity erythema.  Seems erythema is receding.  Has no fever or leukocytosis either. -S/p full-thickness excisional debridement of skin subcutaneous tissues -Antibiotics as above -Follow-up pathology  Hyponatremia and hypomagnesemia Hypomagnesemia resolved.  Hyponatremia mild.  Recheck at follow-up  Iron deficiency anemia Recent Labs  08/25/21 0962 10/19/21 1342 10/20/21 0242 10/21/21 0055 11/09/21 2034 11/10/21 0233 11/11/21 0204 11/12/21 0825  HGB 11.9* 10.5* 8.9* 8.8* 9.3* 7.9* 7.9* 8.1*   Gradual drop in Hgb.  Partly dilutional from IV fluid.  Patient denies melena or hematochezia.  Anemia panel with iron deficiency. -Received IV ferric gluconate 250 mg x 1 prior to discharge. -Recheck CBC at follow-up   PAD (peripheral artery disease) (HCC) S/p right femoral stent in 08/2021. -Continue aspirin and statin.  Hyponatremia Relatively stable. -Continue monitoring  Essential hypertension Normotensive.  Continue home losartan  Anxiety, depression and insomnia Stable.  On significant dose of Elavil. -Continue home Elavil but concerned about risk of adverse effect given age                 Vital signs Vitals:   11/12/21 0026 11/12/21 0500 11/12/21 0800 11/12/21 1307  BP: (!) 158/81 (!) 155/92 138/72 135/72  Pulse: 98 94 85 96  Temp: 98.7 F (37.1 C) 98.3 F (36.8 C) 97.9 F (36.6 C) 98.3 F (36.8 C)  Resp: '16 18 18 18  '$ Height:      Weight:  66.3 kg    SpO2: 94% 96% 99% 97%  TempSrc: Oral Oral Oral Oral  BMI (Calculated):  25.9       Discharge exam  GENERAL: No apparent distress.  Nontoxic. HEENT: MMM.  Vision and hearing grossly intact.  NECK: Supple.  No apparent JVD.  RESP:  No IWOB.  Fair aeration bilaterally. CVS:  RRR. Heart sounds normal.  ABD/GI/GU: BS+. Abd soft, NTND.  MSK/EXT:  Moves extremities.  Dressing and Ace wrap over RLE.  Neurovascular intact distally. SKIN: Dressing and Ace wrap over RLE.   NEURO: Awake and alert. Oriented appropriately.  No apparent focal neuro deficit. PSYCH: Calm. Normal affect.   Discharge Instructions Discharge Instructions     Call MD for:  extreme fatigue   Complete by: As directed    Call MD for:  persistant dizziness or light-headedness   Complete by: As directed    Call MD for:  redness, tenderness, or signs of infection (pain, swelling, redness, odor or green/yellow discharge around incision site)   Complete by: As directed    Call MD for:  severe uncontrolled pain   Complete by: As  directed    Call MD for:  temperature >100.4   Complete by: As directed    Diet - low sodium heart healthy   Complete by: As directed    Discharge instructions   Complete by: As directed    It has been a pleasure taking care of you!  You were hospitalized due to toe gangrene and possible right foot and leg infection (cellulitis) for which you have been treated surgically medically with antibiotics.  Your symptoms improved to the point we think it is safe to let you go home and follow-up with your doctors.  We are discharging you on more antibiotics to complete treatment course for possible infection.  We made some adjustment to your home medication during this hospitalization.  Please review your new medication list and the directions on your medications before you take them.   Take care,   Discharge wound care:   Complete by: As directed    WBAT in surgical shoe.  Would recommend every other day dressing changes with mupirocin ointment to the ulcerated areas of the leg and foot, does not need to apply ointment to the amputation site, dressed with dry sterile gauze  Increase activity slowly   Complete by: As directed       Allergies as of 11/12/2021       Reactions   Latex Itching   Levofloxacin Other (See Comments)   Possible tendon rupture   Morphine And Related Nausea Only        Medication List     STOP taking these medications    losartan-hydrochlorothiazide 100-25 MG tablet Commonly known as: HYZAAR       TAKE these medications    acetaminophen 500 MG tablet Commonly known as: TYLENOL Take 2 tablets (1,000 mg total) by mouth every 8 (eight) hours for 4 days, THEN 2 tablets (1,000 mg total) every 8 (eight) hours as needed for up to 4 days. Start taking on: November 12, 2021   amitriptyline 75 MG tablet Commonly known as: ELAVIL Take 75 mg by mouth at bedtime.   aspirin EC 81 MG tablet Take 81 mg by mouth daily. Swallow whole.   atorvastatin 20 MG  tablet Commonly known as: LIPITOR Take 20 mg by mouth at bedtime.   calcium carbonate 600 MG Tabs tablet Commonly known as: OS-CAL Take 600 mg by mouth 2 (two) times daily with a meal. Vitamin D 400 mg   clopidogrel 75 MG tablet Commonly known as: Plavix Take 1 tablet (75 mg total) by mouth daily. What changed: when to take this   COQ10 PO Take 200 mg by mouth daily.   diphenoxylate-atropine 2.5-0.025 MG tablet Commonly known as: LOMOTIL Take 1 tablet by mouth every 6 (six) hours.   doxycycline 100 MG capsule Commonly known as: VIBRAMYCIN Take 1 capsule (100 mg total) by mouth 2 (two) times daily for 8 days.   fluticasone 50 MCG/ACT nasal spray Commonly known as: FLONASE Place 1 spray into both nostrils daily as needed for allergies or rhinitis.   furosemide 40 MG tablet Commonly known as: LASIX Take 1 tablet (40 mg total) by mouth daily. What changed: when to take this   gabapentin 600 MG tablet Commonly known as: NEURONTIN Take 600 mg by mouth 3 (three) times daily.   losartan 100 MG tablet Commonly known as: COZAAR Take 1 tablet (100 mg total) by mouth daily.   Lysine HCl 1000 MG Tabs Take 1,000 mg by mouth daily as needed (wound healing).   MAGNESIUM PO Take 400 mg by mouth at bedtime.   montelukast 10 MG tablet Commonly known as: SINGULAIR Take 10 mg by mouth daily.   multivitamin tablet Take 1 tablet by mouth daily.   mupirocin ointment 2 % Commonly known as: BACTROBAN Apply 1 application. topically daily.   oxyCODONE 5 MG immediate release tablet Commonly known as: Oxy IR/ROXICODONE Take 1 tablet (5 mg total) by mouth every 8 (eight) hours as needed for up to 3 days for breakthrough pain or severe pain.   pantoprazole 40 MG tablet Commonly known as: PROTONIX Take 40 mg by mouth daily.   promethazine 25 MG tablet Commonly known as: PHENERGAN Take 25 mg by mouth every 6 (six) hours as needed for nausea/vomiting.   raloxifene 60 MG  tablet Commonly known as: EVISTA Take 60 mg by mouth daily.   senna-docusate 8.6-50 MG tablet Commonly known as: Senokot-S Take 2 tablets by mouth at bedtime.   VITAMIN D3 PO Take 5,000 Units by mouth at bedtime.               Durable Medical Equipment  (From admission, onward)  Start     Ordered   11/12/21 1046  For home use only DME 3 n 1  Once        11/12/21 1046              Discharge Care Instructions  (From admission, onward)           Start     Ordered   11/12/21 0000  Discharge wound care:       Comments: WBAT in surgical shoe.  Would recommend every other day dressing changes with mupirocin ointment to the ulcerated areas of the leg and foot, does not need to apply ointment to the amputation site, dressed with dry sterile gauze     11/12/21 0909            Consultations: Vascular surgery Infectious disease Podiatry  Procedures/Studies: 1) partial amputation right third toe 2) full-thickness excisional debridement skin subcutaneous tissue 3.0 x 1.5 cm, 6.0 x 1.5 cm   CT Angio Chest/Abd/Pel for Dissection W and/or W/WO  Result Date: 10/21/2021 CLINICAL DATA:  Inpatient. Gangrenous second and third toes in the right foot. Monophasic waveforms in the right lower extremity on duplex ultrasound, evaluate for possible proximal vascular disease. EXAM: CT ANGIOGRAPHY CHEST, ABDOMEN AND PELVIS TECHNIQUE: Non-contrast CT of the chest was initially obtained. Multidetector CT imaging through the chest, abdomen and pelvis was performed using the standard protocol during bolus administration of intravenous contrast. Multiplanar reconstructed images and MIPs were obtained and reviewed to evaluate the vascular anatomy. RADIATION DOSE REDUCTION: This exam was performed according to the departmental dose-optimization program which includes automated exposure control, adjustment of the mA and/or kV according to patient size and/or use of iterative  reconstruction technique. CONTRAST:  193m OMNIPAQUE IOHEXOL 350 MG/ML SOLN COMPARISON:  08/04/2021 CT angiogram of the chest, abdomen and pelvis with lower extremity runoff. FINDINGS: CTA CHEST FINDINGS Cardiovascular: Normal heart size. No significant pericardial effusion/thickening. Three-vessel coronary atherosclerosis. Atherosclerotic nonaneurysmal thoracic aorta. No evidence of acute intramural hematoma, dissection, pseudoaneurysm or penetrating atherosclerotic ulcer in the thoracic aorta. Aortic arch branch vessels are patent. Normal caliber pulmonary arteries. No central pulmonary emboli. Mediastinum/Nodes: No discrete thyroid nodules. Unremarkable esophagus. Surgical clips in the right axilla. No axillary adenopathy. Lungs/Pleura: No pneumothorax. No pleural effusion. No acute consolidative airspace disease, lung masses or significant pulmonary nodules. Musculoskeletal: No aggressive appearing focal osseous lesions. Healed lateral left sixth and seventh rib fractures. Acute posterolateral left eighth rib fracture, new from 08/04/2021 CT. Mild thoracic spondylosis. Stable post lumpectomy change in the right breast. Review of the MIP images confirms the above findings. CTA ABDOMEN AND PELVIS FINDINGS VASCULAR Aorta: Moderately atherosclerotic nonaneurysmal abdominal aorta without dissection, vasculitis or significant stenosis. Minimum luminal diameter 8.3 x 7.6 mm. Celiac: Patent without evidence of aneurysm, dissection, vasculitis or significant stenosis. SMA: Mildly atherosclerotic without evidence of aneurysm, dissection, vasculitis or significant stenosis. Renals: Bilateral single renal arteries are patent without evidence of aneurysm, dissection, vasculitis, fibromuscular dysplasia or significant stenosis. IMA: Patent without evidence of aneurysm, dissection, vasculitis or significant stenosis. Inflow: Mildly atherosclerotic iliofemoral arteries bilaterally without evidence of aneurysm, dissection,  vasculitis or significant stenosis. Veins: No obvious venous abnormality within the limitations of this arterial phase study. Review of the MIP images confirms the above findings. NON-VASCULAR Hepatobiliary: Normal liver with no liver mass. Normal gallbladder with no radiopaque cholelithiasis. No biliary ductal dilatation. Pancreas: Normal, with no mass or duct dilation. Spleen: Normal size. No mass. Adrenals/Urinary Tract: Normal adrenals. No contour deforming renal masses.  No hydronephrosis. Normal bladder. Stomach/Bowel: Moderate hiatal hernia. Otherwise normal nondistended stomach. Normal caliber small bowel with no small bowel wall thickening. Appendectomy. Postsurgical changes from partial distal colectomy with intact appearing distal colonic anastomosis. Moderate diffuse colonic diverticulosis with no large bowel wall thickening or significant pericolonic fat stranding. Vascular/Lymphatic: No pathologically enlarged lymph nodes in the abdomen or pelvis. Reproductive: Status post hysterectomy, with no abnormal findings at the vaginal cuff. No adnexal mass. Other: No pneumoperitoneum, ascites or focal fluid collection. Musculoskeletal: No aggressive appearing focal osseous lesions. Review of the MIP images confirms the above findings. IMPRESSION: 1. No acute aortic syndrome. Mild-to-moderate arterial atherosclerosis in the abdominal aorta and branch vessels. No high-grade stenoses. 2. Acute posterolateral left eighth rib fracture, new from 08/04/2021 CT. No pneumothorax. 3. Three-vessel coronary atherosclerosis. 4. Moderate hiatal hernia. 5. Moderate diffuse colonic diverticulosis. 6. Aortic Atherosclerosis (ICD10-I70.0). Electronically Signed   By: Ilona Sorrel M.D.   On: 10/21/2021 10:05   VAS [place] LOWER EXTREMITY ARTERIAL DUPLEX  Result Date: 10/20/2021 LOWER EXTREMITY ARTERIAL DUPLEX STUDY Patient Name:  STEFANNY PIERI Rezek  Date of Exam:   10/20/2021 Medical Rec #: [id number]           Accession #:     [id number] Date of Birth: 17-Oct-1950           Patient Gender: F Patient Age:   74 years Exam Location:  [facility] Procedure:      VAS [place] LOWER EXTREMITY ARTERIAL DUPLEX Referring Phys: ERIC CHEN --------------------------------------------------------------------------------  Indications: Ulceration, gangrene, peripheral artery disease, and Right SFA              stent. High Risk Factors: Hypertension, hyperlipidemia, no history of smoking.  Vascular               Right SFA stent 08/2021. Interventions: Current ABI:           ABI/TBI- right=1.09 (falsely elevated)/0.45,                        left=1.05/0.12 Comparison Study: No prior study Performing Technologist: Maudry Mayhew MHA, RDMS, RVT, RDCS  Examination Guidelines: A complete evaluation includes B-mode imaging, spectral Doppler, color Doppler, and power Doppler as needed of all accessible portions of each vessel. Bilateral testing is considered an integral part of a complete examination. Limited examinations for reoccurring indications may be performed as noted.  +-----------+--------+-----+--------+----------+--------+ RIGHT      PSV cm/sRatioStenosisWaveform  Comments +-----------+--------+-----+--------+----------+--------+ CFA Mid    181                  monophasic         +-----------+--------+-----+--------+----------+--------+ DFA        67                   monophasic         +-----------+--------+-----+--------+----------+--------+ SFA Prox   226                  monophasic         +-----------+--------+-----+--------+----------+--------+ SFA Mid    213                  monophasic         +-----------+--------+-----+--------+----------+--------+ SFA Distal 180                  monophasic         +-----------+--------+-----+--------+----------+--------+ POP Prox   220  monophasic         +-----------+--------+-----+--------+----------+--------+ POP Distal 179                   monophasic         +-----------+--------+-----+--------+----------+--------+ TP Trunk   168                  monophasic         +-----------+--------+-----+--------+----------+--------+ ATA Distal 123                  monophasic         +-----------+--------+-----+--------+----------+--------+ PTA Distal 86                   monophasic         +-----------+--------+-----+--------+----------+--------+ PERO Distal108                  monophasic         +-----------+--------+-----+--------+----------+--------+  Right Stent(s): +---------------+--------+--------+----------+--------+ Prox SFA       PSV cm/sStenosisWaveform  Comments +---------------+--------+--------+----------+--------+ Prox to Stent  226             monophasic         +---------------+--------+--------+----------+--------+ Proximal Stent 188             monophasic         +---------------+--------+--------+----------+--------+ Mid Stent      191             monophasic         +---------------+--------+--------+----------+--------+ Distal Stent   218             monophasic         +---------------+--------+--------+----------+--------+ Distal to [id number]             monophasic         +---------------+--------+--------+----------+--------+    Summary: Right: Patent stent with 1-49% stenosis in the right proximal superficial femoral artery.  See table(s) above for measurements and observations. Electronically signed by Jamelle Haring on 10/20/2021 at 9:14:48 PM.    Final    VAS [place] ABI WITH/WO TBI  Result Date: 10/20/2021  LOWER EXTREMITY DOPPLER STUDY Patient Name:  Madinah Quarry  Date of Exam:   10/20/2021 Medical Rec #: [id number]           Accession #:    [id number] Date of Birth: [birth year]           Patient Gender: F Patient Age:   59 years Exam Location:  [facility] Procedure:      VAS [place] ABI WITH/WO TBI Referring Phys: ERIC CHEN  --------------------------------------------------------------------------------  Indications: Gangrene, and peripheral artery disease. High Risk Factors: Hypertension, hyperlipidemia, no history of smoking.  Vascular Interventions: Right SFA stent 08/2021. Comparison Study: 07/23/2021- noncompressible ABI bilaterally Performing Technologist: Maudry Mayhew MHA, RVT, RDCS, RDMS  Examination Guidelines: A complete evaluation includes at minimum, Doppler waveform signals and systolic blood pressure reading at the level of bilateral brachial, anterior tibial, and posterior tibial arteries, when vessel segments are accessible. Bilateral testing is considered an integral part of a complete examination. Photoelectric Plethysmograph (PPG) waveforms and toe systolic pressure readings are included as required and additional duplex testing as needed. Limited examinations for reoccurring indications may be performed as noted.  ABI Findings: +---------+------------------+-----+----------+--------------------+ Right    Rt Pressure (mmHg)IndexWaveform  Comment              +---------+------------------+-----+----------+--------------------+ Brachial  triphasic Restricted extremity +---------+------------------+-----+----------+--------------------+ PTA      187               1.09 monophasic                     +---------+------------------+-----+----------+--------------------+ DP       158               0.92 monophasic                     +---------+------------------+-----+----------+--------------------+ Great Toe77                0.45                                +---------+------------------+-----+----------+--------------------+ +---------+------------------+-----+----------+-------+ Left     Lt Pressure (mmHg)IndexWaveform  Comment +---------+------------------+-----+----------+-------+ Brachial 172                    triphasic          +---------+------------------+-----+----------+-------+ PTA      181               1.05 monophasic        +---------+------------------+-----+----------+-------+ DP       166               0.97 triphasic         +---------+------------------+-----+----------+-------+ Great Toe21                0.12                   +---------+------------------+-----+----------+-------+ +-------+-----------+-----------+------------+------------+ ABI/TBIToday's ABIToday's TBIPrevious ABIPrevious TBI +-------+-----------+-----------+------------+------------+ Right  1.09       0.45       1.48        0.57         +-------+-----------+-----------+------------+------------+ Left   1.05       0.12       1.39        0.55         +-------+-----------+-----------+------------+------------+  Summary: Right: Resting right ankle-brachial index is within normal range, however given previous noncompressible ABI and abnormal waveforms, this is likely falsely elevated. The right toe-brachial index is abnormal. Left: Resting left ankle-brachial index is within normal range. No evidence of significant left lower extremity arterial disease. The left toe-brachial index is abnormal. *See table(s) above for measurements and observations.  Electronically signed by Jamelle Haring on 10/20/2021 at 9:14:02 PM.    Final    MR FOOT RIGHT WO CONTRAST  Result Date: 10/20/2021 CLINICAL DATA:  Right second and third toe dry gangrene. EXAM: MRI OF THE RIGHT FOREFOOT WITHOUT CONTRAST TECHNIQUE: Multiplanar, multisequence MR imaging of the right forefoot was performed. No intravenous contrast was administered. COMPARISON:  Right foot x-rays from yesterday. FINDINGS: Bones/Joint/Cartilage No marrow signal abnormality. No fracture or dislocation. Joint spaces are preserved. No joint effusion. Ligaments Collateral ligaments are intact. Muscles and Tendons Flexor and extensor tendons are intact. Soft tissue Soft tissue ulceration at  the tip of the third toe. Diffuse soft tissue swelling of the forefoot. No fluid collection or hematoma. No soft tissue mass. IMPRESSION: 1. Soft tissue ulceration at the tip of the third toe. No evidence of osteomyelitis or abscess. Electronically Signed   By: Titus Dubin M.D.   On: 10/20/2021 08:52   DG Foot Complete Right  Result Date: 10/19/2021 CLINICAL DATA:  Gangrene of  the second and third toes. Pain and swelling. EXAM: RIGHT FOOT COMPLETE - 3+ VIEW; RIGHT TIBIA AND FIBULA - 2 VIEW COMPARISON:  None. FINDINGS: There is soft tissue swelling over the dorsum of the foot as well as the third toe. There is no evidence for acute fracture or dislocation. There are no cortical erosions. There is no focal osteopenia. There is a small nonspecific soft tissue calcification in the mid third toe laterally. Joint spaces are well maintained. IMPRESSION: *No acute bony abnormality. *Soft tissue swelling of the third toe and dorsum of the foot. Electronically Signed   By: Ronney Asters M.D.   On: 10/19/2021 22:07   DG Tibia/Fibula Right  Result Date: 10/19/2021 CLINICAL DATA:  Gangrene of the second and third toes. Pain and swelling. EXAM: RIGHT FOOT COMPLETE - 3+ VIEW; RIGHT TIBIA AND FIBULA - 2 VIEW COMPARISON:  None. FINDINGS: There is soft tissue swelling over the dorsum of the foot as well as the third toe. There is no evidence for acute fracture or dislocation. There are no cortical erosions. There is no focal osteopenia. There is a small nonspecific soft tissue calcification in the mid third toe laterally. Joint spaces are well maintained. IMPRESSION: *No acute bony abnormality. *Soft tissue swelling of the third toe and dorsum of the foot. Electronically Signed   By: Ronney Asters M.D.   On: 10/19/2021 22:07       The results of significant diagnostics from this hospitalization (including imaging, microbiology, ancillary and laboratory) are listed below for reference.     Microbiology: Recent  Results (from the past 240 hour(s))  Aerobic/Anaerobic Culture w Gram Stain (surgical/deep wound)     Status: None (Preliminary result)   Collection Time: 11/11/21  8:06 PM   Specimen: Wound  Result Value Ref Range Status   Specimen Description WOUND  Final   Special Requests RIGHT LEG TEST VANCO  Final   Gram Stain   Final    RARE WBC PRESENT,BOTH PMN AND MONONUCLEAR NO ORGANISMS SEEN    Culture   Final    NO GROWTH < 12 HOURS Performed at [facility], [place] [address] [place], [place] [address]    Report Status PENDING  Incomplete     Labs:  CBC: Recent Labs  Lab 11/09/21 2034 11/10/21 0233 11/11/21 0204 11/12/21 0825  WBC 8.6 8.3 8.1 9.5  NEUTROABS 5.9 5.4 5.2  --   HGB 9.3* 7.9* 7.9* 8.1*  HCT 29.6* 25.1* 24.7* 25.9*  MCV 86.3 86.0 85.8 85.5  PLT 567* 533* 521* 559*   BMP &GFR Recent Labs  Lab 11/09/21 2034 11/10/21 0233 11/11/21 0204 11/12/21 0825  NA 136 134* 136 133*  K 3.5 3.7 4.0 4.0  CL 102 102 107 104  CO2 '26 22 23 22  '$ GLUCOSE 138* 132* 122* 123*  BUN '14 14 9 '$ 6*  CREATININE 0.83 0.69 0.62 0.68  CALCIUM 8.8* 8.4* 8.6* 8.6*  MG 1.4* 1.4* 2.2 1.9  PHOS 3.1 3.7 3.5 4.1   Estimated Creatinine Clearance: 59.1 mL/min (by C-G formula based on SCr of 0.68 mg/dL). Liver & Pancreas: Recent Labs  Lab 11/09/21 2034 11/10/21 0233 11/11/21 0204 11/12/21 0825  AST 16 14*  --   --   ALT 12 13  --   --   ALKPHOS 109 92  --   --   BILITOT 0.1* 0.5  --   --   PROT 5.7* 5.1*  --   --   ALBUMIN 2.7* 2.5*  2.5* 2.7*   No results for input(s): "LIPASE", "AMYLASE" in the last 168 hours. No results for input(s): "AMMONIA" in the last 168 hours. Diabetic: No results for input(s): "HGBA1C" in the last 72 hours. No results for input(s): "GLUCAP" in the last 168 hours. Cardiac Enzymes: No results for input(s): "CKTOTAL", "CKMB", "CKMBINDEX", "TROPONINI" in the last 168 hours. No results for input(s): "PROBNP" in the last 8760 hours. Coagulation  Profile: No results for input(s): "INR", "PROTIME" in the last 168 hours. Thyroid Function Tests: No results for input(s): "TSH", "T4TOTAL", "FREET4", "T3FREE", "THYROIDAB" in the last 72 hours. Lipid Profile: No results for input(s): "CHOL", "HDL", "LDLCALC", "TRIG", "CHOLHDL", "LDLDIRECT" in the last 72 hours. Anemia Panel: Recent Labs    11/11/21 0204  VITAMINB12 1,329*  FOLATE 10.9  FERRITIN 17  TIBC 297  IRON 34  RETICCTPCT 1.9   Urine analysis: No results found for: "COLORURINE", "APPEARANCEUR", "LABSPEC", "PHURINE", "GLUCOSEU", "HGBUR", "BILIRUBINUR", "KETONESUR", "PROTEINUR", "UROBILINOGEN", "NITRITE", "LEUKOCYTESUR" Sepsis Labs: Invalid input(s): "PROCALCITONIN", "LACTICIDVEN"   Time coordinating discharge: 45 minutes  SIGNED:  Mercy Riding, MD  [facility] 11/12/2021, 6:12 PM

## 2021-11-13 ENCOUNTER — Other Ambulatory Visit: Payer: Self-pay | Admitting: Student

## 2021-11-13 MED ORDER — MUPIROCIN 2 % EX OINT: 1.0000 "application " | TOPICAL_OINTMENT | Freq: Every day | CUTANEOUS | 0 refills | Status: DC

## 2021-11-13 NOTE — Progress Notes (Signed)
Rx for Mupirocin sent to [facility] in [place]

## 2021-11-15 LAB — SURGICAL PATHOLOGY

## 2021-11-16 LAB — AEROBIC/ANAEROBIC CULTURE W GRAM STAIN (SURGICAL/DEEP WOUND)

## 2021-11-22 ENCOUNTER — Ambulatory Visit (INDEPENDENT_AMBULATORY_CARE_PROVIDER_SITE_OTHER): Payer: Medicare HMO | Admitting: Podiatry

## 2021-11-22 DIAGNOSIS — Z89421 Acquired absence of other right toe(s): Secondary | ICD-10-CM | POA: Diagnosis not present

## 2021-11-22 DIAGNOSIS — L97912 Non-pressure chronic ulcer of unspecified part of right lower leg with fat layer exposed: Secondary | ICD-10-CM | POA: Diagnosis not present

## 2021-11-22 MED ORDER — MUPIROCIN 2 % EX OINT: 1.0000 | TOPICAL_OINTMENT | Freq: Every day | CUTANEOUS | 2 refills | Status: DC

## 2021-11-22 NOTE — Progress Notes (Signed)
  Subjective:  Patient ID: [name], female    DOB: Mar 23, 1951,  MRN: [id number]  Chief Complaint  Patient presents with   Routine Post Op      POV#1 DOS 11/11/2021 AMPUTATION TOE;RIGHT FOOT    Procedure: Amputation of right third toe, debridement of wounds with application of ACell  71 y.o. female returns for post-op check.  Overall doing well she says they were able to finally get the mupirocin and have been using it  Review of Systems: Negative except as noted in the HPI. Denies N/V/F/Ch.   Objective:  There were no vitals filed for this visit. There is no height or weight on file to calculate BMI. Constitutional Well developed. Well nourished.  Vascular Foot warm and well perfused. Capillary refill normal to all digits.  Calf is soft and supple, no posterior calf or knee pain, negative Homans' sign  Neurologic Normal speech. Oriented to person, place, and time. Epicritic sensation to light touch grossly reduced bilaterally.  Dermatologic Skin healing well without signs of infection. Skin edges well coapted without signs of infection.  She has multiple lower extremity ulcerations the worst of which is on the mid lower lateral leg and the anterior proximal shin  Orthopedic: Tenderness to palpation noted about the surgical site.   Pathology results with staph epidermidis sensitive to tetracycline  Wound biopsy, toe amputation  FINAL MICROSCOPIC DIAGNOSIS:   A. RIGHT LEG WOUND, EXCISION:  Gangrenous ulcer with underlying gangrenous cellulitis with scattered  foreign body type giant cells   B. RIGHT THIRD TOE, AMPUTATION:  Gangrenous ulcer with underlying gangrenous cellulitis  Negative for acute osteomyelitis  Acute cellulitis present in proximal margin  Arteriolosclerosis    Assessment:   1. Ulcer of right lower extremity with fat layer exposed ([place])   2. Status post amputation of lesser toe of right foot ([place])    Plan:  Patient was evaluated and treated  and all questions answered.  S/p foot surgery right -Progressing as expected post-operatively. -WB Status: May return to regular shoe gear -Sutures: Staples and sutures removed from biopsy sites and leg debridement, toe amputation site left intact, she will return for the nurse schedule next week to have them removed. -Medications: Refill of mupirocin sent. -Regarding her leg ulcerations I would like her to have continued care at the wound care center, urgent referral was sent.  Advised her if they are unable to see her within the next 3 to 4 weeks that she should let me know and I can see her again here 1 more time.  I suspect that this is a form of pyoderma gangrenosum versus vasculitic ulcerations -Foot redressed.  No follow-ups on file.

## 2021-11-22 NOTE — Patient Instructions (Signed)
[facility]: Address: [address], [place], [place] [address] Phone: [phone]   Call to schedule your appointment. If they cannot see you by July 10-14, please let me know and schedule a follow up here

## 2021-11-23 ENCOUNTER — Other Ambulatory Visit: Payer: Self-pay

## 2021-11-23 ENCOUNTER — Ambulatory Visit (INDEPENDENT_AMBULATORY_CARE_PROVIDER_SITE_OTHER): Payer: Medicare HMO | Admitting: Internal Medicine

## 2021-11-23 ENCOUNTER — Encounter: Payer: Self-pay | Admitting: Internal Medicine

## 2021-11-23 VITALS — BP 108/76 | HR 103 | Temp 98.4°F | Wt 146.0 lb

## 2021-11-23 DIAGNOSIS — S91301S Unspecified open wound, right foot, sequela: Secondary | ICD-10-CM

## 2021-11-23 DIAGNOSIS — R7303 Prediabetes: Secondary | ICD-10-CM | POA: Diagnosis not present

## 2021-11-23 DIAGNOSIS — K219 Gastro-esophageal reflux disease without esophagitis: Secondary | ICD-10-CM | POA: Diagnosis not present

## 2021-11-23 DIAGNOSIS — I96 Gangrene, not elsewhere classified: Secondary | ICD-10-CM | POA: Diagnosis not present

## 2021-11-23 DIAGNOSIS — S98131A Complete traumatic amputation of one right lesser toe, initial encounter: Secondary | ICD-10-CM | POA: Diagnosis not present

## 2021-11-23 DIAGNOSIS — E871 Hypo-osmolality and hyponatremia: Secondary | ICD-10-CM | POA: Diagnosis not present

## 2021-11-23 DIAGNOSIS — I10 Essential (primary) hypertension: Secondary | ICD-10-CM | POA: Diagnosis not present

## 2021-11-23 DIAGNOSIS — L03031 Cellulitis of right toe: Secondary | ICD-10-CM

## 2021-11-23 DIAGNOSIS — M1990 Unspecified osteoarthritis, unspecified site: Secondary | ICD-10-CM | POA: Diagnosis not present

## 2021-11-23 DIAGNOSIS — C50919 Malignant neoplasm of unspecified site of unspecified female breast: Secondary | ICD-10-CM | POA: Diagnosis not present

## 2021-11-23 NOTE — Progress Notes (Addendum)
Patient Active Problem List   Diagnosis Date Noted   Hyponatremia and hypomagnesemia 11/11/2021   Multiple right leg ulcer with erythema 11/10/2021   Anxiety, depression and insomnia 11/10/2021   Essential hypertension 11/10/2021   Iron deficiency anemia 11/10/2021   Hyponatremia 11/10/2021   PAD (peripheral artery disease) ([place]) 10/19/2021   Gangrene of toe of right foot ([place]) 10/19/2021   Idiopathic neuropathy 10/19/2021   Vitamin D deficiency 10/19/2021   Insomnia 10/19/2021   Depression 10/19/2021    Patient's Medications  New Prescriptions   No medications on file  Previous Medications   AMITRIPTYLINE (ELAVIL) 75 MG TABLET    Take 75 mg by mouth at bedtime.   ASPIRIN EC 81 MG TABLET    Take 81 mg by mouth daily. Swallow whole.   ATORVASTATIN (LIPITOR) 20 MG TABLET    Take 20 mg by mouth at bedtime.   CALCIUM CARBONATE (OS-CAL) 600 MG TABS    Take 600 mg by mouth 2 (two) times daily with a meal. Vitamin D 400 mg   CHOLECALCIFEROL (VITAMIN D3 PO)    Take 5,000 Units by mouth at bedtime.   CLOPIDOGREL (PLAVIX) 75 MG TABLET    Take 1 tablet (75 mg total) by mouth daily.   COENZYME Q10 (COQ10 PO)    Take 200 mg by mouth daily.   DIPHENOXYLATE-ATROPINE (LOMOTIL) 2.5-0.025 MG TABLET    Take 1 tablet by mouth every 6 (six) hours.   FLUTICASONE (FLONASE) 50 MCG/ACT NASAL SPRAY    Place 1 spray into both nostrils daily as needed for allergies or rhinitis.   FUROSEMIDE (LASIX) 40 MG TABLET    Take 1 tablet (40 mg total) by mouth daily.   GABAPENTIN (NEURONTIN) 600 MG TABLET    Take 600 mg by mouth 3 (three) times daily.   LOSARTAN (COZAAR) 100 MG TABLET    Take 1 tablet (100 mg total) by mouth daily.   LYSINE HCL 1000 MG TABS    Take 1,000 mg by mouth daily as needed (wound healing).   MAGNESIUM PO    Take 400 mg by mouth at bedtime.   MONTELUKAST (SINGULAIR) 10 MG TABLET    Take 10 mg by mouth daily.   MULTIPLE VITAMIN (MULTIVITAMIN) TABLET    Take 1 tablet by mouth  daily.   MUPIROCIN OINTMENT (BACTROBAN) 2 %    Apply 1 Application topically daily.   PANTOPRAZOLE (PROTONIX) 40 MG TABLET    Take 40 mg by mouth daily.   PROMETHAZINE (PHENERGAN) 25 MG TABLET    Take 25 mg by mouth every 6 (six) hours as needed for nausea/vomiting.   RALOXIFENE (EVISTA) 60 MG TABLET    Take 60 mg by mouth daily.   SENNA-DOCUSATE (SENOKOT-S) 8.6-50 MG TABLET    Take 2 tablets by mouth at bedtime.  Modified Medications   No medications on file  Discontinued Medications   No medications on file    Subjective: 71 year old 71-year-old female with past medical history as below presents for hospital follow-up.  She was admitted to [facility] 6/6-6/9 with right lower extremity cellulitis and right third toe gangrene.  She had also had a recent admission 5/16-5/20 for right lower extremity cellulitis and 3rd toe gangrene.  podiatry was consulted and did not see any signs of acute infection.  She was discharged on Bactrim x2 weeks.  During recent admission she was started on vancomycin.  MRI showed soft tissue ulceration at the tip  of the third toe.    Seen by vascular she was felt to be optimized on vascular standpoint.She was taken to the OR Partial third toe amputation.  OR cultures grew staph epi.  She was discharged on doxycycline with ID follow-up. Interim: Pathology showed brain mass ulcer with giant cell on the right lower extremity wound, no acute osteomyelitis at third toe amputation site. Seen by Podiatry(Dr. Sherryle Lis) on 6/19 urgent referral to wound care placed.  Today 11/23/2021: She states she stopped antibiotics on 6/17.  She denies fever, chills, worsening wounds.  Review of Systems: Review of Systems  All other systems reviewed and are negative.   Past Medical History:  Diagnosis Date   Arthritis    joints   Asthma    allergic   Breast cancer ([place])    Gastric mass 05/24/2012   GERD (gastroesophageal reflux disease)    H/O hiatal hernia    Heart murmur    not  MVP no treatment   Hyperlipidemia    Hypertension    Insomnia    Osteopenia    Osteoporosis    PONV (postoperative nausea and vomiting)    Rosacea    neck,cheeks    Social History   Tobacco Use   Smoking status: Never   Smokeless tobacco: Never  Substance Use Topics   Alcohol use: No   Drug use: No    No family history on file.  Allergies  Allergen Reactions   Latex Itching   Levofloxacin Other (See Comments)    Possible tendon rupture   Morphine And Related Nausea Only    Health Maintenance  Topic Date Due   COVID-19 Vaccine (1) Never done   Hepatitis C Screening  Never done   TETANUS/TDAP  Never done   COLONOSCOPY (Pts 45-87yr Insurance coverage will need to be confirmed)  Never done   MAMMOGRAM  Never done   Zoster Vaccines- Shingrix (1 of 2) Never done   Pneumonia Vaccine 72 Years old (1 - PCV) Never done   DEXA SCAN  Never done   INFLUENZA VACCINE  01/04/2022   HPV VACCINES  Aged Out    Objective:  Vitals:   11/23/21 1108  Weight: 146 lb (66.2 kg)   Body mass index is 25.86 kg/m.  Physical Exam Constitutional:      Appearance: Normal appearance.  HENT:     Head: Normocephalic and atraumatic.     Right Ear: Tympanic membrane normal.     Left Ear: Tympanic membrane normal.     Nose: Nose normal.     Mouth/Throat:     Mouth: Mucous membranes are moist.  Eyes:     Extraocular Movements: Extraocular movements intact.     Conjunctiva/sclera: Conjunctivae normal.     Pupils: Pupils are equal, round, and reactive to light.  Cardiovascular:     Rate and Rhythm: Normal rate and regular rhythm.     Heart sounds: No murmur heard.    No friction rub. No gallop.  Pulmonary:     Effort: Pulmonary effort is normal.     Breath sounds: Normal breath sounds.  Abdominal:     General: Abdomen is flat.     Palpations: Abdomen is soft.  Musculoskeletal:        General: Normal range of motion.  Skin:    General: Skin is warm and dry.  Neurological:      General: No focal deficit present.     Mental Status: She is alert and oriented to person,  place, and time.  Psychiatric:        Mood and Affect: Mood normal.     Lab Results Lab Results  Component Value Date   WBC 9.5 11/12/2021   HGB 8.1 (L) 11/12/2021   HCT 25.9 (L) 11/12/2021   MCV 85.5 11/12/2021   PLT 559 (H) 11/12/2021    Lab Results  Component Value Date   CREATININE 0.68 11/12/2021   BUN 6 (L) 11/12/2021   NA 133 (L) 11/12/2021   K 4.0 11/12/2021   CL 104 11/12/2021   CO2 22 11/12/2021    Lab Results  Component Value Date   ALT 13 11/10/2021   AST 14 (L) 11/10/2021   ALKPHOS 92 11/10/2021   BILITOT 0.5 11/10/2021    Lab Results  Component Value Date   CHOL 153 10/20/2021   HDL 56 10/20/2021   LDLCALC 85 10/20/2021   TRIG 59 10/20/2021   CHOLHDL 2.7 10/20/2021   No results found for: "LABRPR", "RPRTITER" No results found for: "HIV1RNAQUANT", "HIV1RNAVL", "CD4TABS"   A/P #Rt 3rd toe gangrene, failed outpatient therapy SP partial right 3rd toe amputaion #RLE erythema -Pathology was negative from acute oM at thrid toe amputation site. Both right leg wound and 3rd toe amputation showed gangrenous ulcer with gangrenous cellulitis, scattered freign body type giant cells on RLW. As not OM noted, no further need for antibiotics. Last dose of antibiotics was on Saturday 6/17(sufficient coverage for SSTI). -OR Cx of wound+ Staph epi->felt to be contaminant Plan -Stop antibioitcs,  -HCV in the setting of possible pyoderma given her age.  Counseled to follow up with dermatology for complete work-up of possible pyoderma/vasculitis -Follow-up with wound care -Follow-up PRN  I spent more than 45 minutes for this patient encounter including reviewing data/chart, and coordinating care and >50% direct face to face time providing counseling/discussing diagnostics/treatment plan with patient   Laurice Record, [place] [facility] 11/23/2021, 11:14 AM

## 2021-11-24 ENCOUNTER — Other Ambulatory Visit: Payer: Self-pay | Admitting: *Deleted

## 2021-11-24 DIAGNOSIS — I70261 Atherosclerosis of native arteries of extremities with gangrene, right leg: Secondary | ICD-10-CM

## 2021-11-24 DIAGNOSIS — R2689 Other abnormalities of gait and mobility: Secondary | ICD-10-CM | POA: Diagnosis not present

## 2021-11-24 DIAGNOSIS — M6281 Muscle weakness (generalized): Secondary | ICD-10-CM | POA: Diagnosis not present

## 2021-11-24 DIAGNOSIS — M25674 Stiffness of right foot, not elsewhere classified: Secondary | ICD-10-CM | POA: Diagnosis not present

## 2021-11-24 DIAGNOSIS — I75021 Atheroembolism of right lower extremity: Secondary | ICD-10-CM

## 2021-11-24 DIAGNOSIS — I739 Peripheral vascular disease, unspecified: Secondary | ICD-10-CM

## 2021-11-24 DIAGNOSIS — M79671 Pain in right foot: Secondary | ICD-10-CM | POA: Diagnosis not present

## 2021-11-24 LAB — HEPATITIS C ANTIBODY: Hepatitis C Ab: NONREACTIVE

## 2021-11-29 DIAGNOSIS — M25674 Stiffness of right foot, not elsewhere classified: Secondary | ICD-10-CM | POA: Diagnosis not present

## 2021-11-29 DIAGNOSIS — R2689 Other abnormalities of gait and mobility: Secondary | ICD-10-CM | POA: Diagnosis not present

## 2021-11-29 DIAGNOSIS — M6281 Muscle weakness (generalized): Secondary | ICD-10-CM | POA: Diagnosis not present

## 2021-11-29 DIAGNOSIS — M79671 Pain in right foot: Secondary | ICD-10-CM | POA: Diagnosis not present

## 2021-11-30 ENCOUNTER — Encounter: Payer: Medicare HMO | Attending: Physician Assistant | Admitting: Physician Assistant

## 2021-11-30 DIAGNOSIS — I739 Peripheral vascular disease, unspecified: Secondary | ICD-10-CM | POA: Diagnosis not present

## 2021-11-30 DIAGNOSIS — Z89431 Acquired absence of right foot: Secondary | ICD-10-CM | POA: Diagnosis not present

## 2021-11-30 DIAGNOSIS — L03115 Cellulitis of right lower limb: Secondary | ICD-10-CM | POA: Insufficient documentation

## 2021-11-30 DIAGNOSIS — Z89421 Acquired absence of other right toe(s): Secondary | ICD-10-CM | POA: Diagnosis not present

## 2021-11-30 DIAGNOSIS — I87331 Chronic venous hypertension (idiopathic) with ulcer and inflammation of right lower extremity: Secondary | ICD-10-CM | POA: Insufficient documentation

## 2021-11-30 DIAGNOSIS — L97812 Non-pressure chronic ulcer of other part of right lower leg with fat layer exposed: Secondary | ICD-10-CM | POA: Diagnosis not present

## 2021-12-01 ENCOUNTER — Ambulatory Visit (INDEPENDENT_AMBULATORY_CARE_PROVIDER_SITE_OTHER): Payer: Medicare HMO

## 2021-12-01 DIAGNOSIS — Z89421 Acquired absence of other right toe(s): Secondary | ICD-10-CM

## 2021-12-01 NOTE — Progress Notes (Signed)
Patient seen in office for post op visit #2 Amputation toe, Right foot. Patient denies any nausea, vomiting, fever, chills. Stiches and staples were removed today. Patient tolerated procedure with no complications. Cleansed area, dressed with iodine followed by an adhesive bandage. Patient was advised to keep area dry for a least 1 to 2 days.   All questions were answered at this visit.  Advised patient to contact office with any concerns. If any signs or symptoms of infection occur to go to the ED immediately or to contact our office.

## 2021-12-01 NOTE — Progress Notes (Signed)
CLOE, SOCKWELL ([id number]) Visit Report for 11/30/2021 Abuse Risk Screen Details Patient Name: [name], [name]. Date of Service: 11/30/2021 8:45 AM Medical Record Number: [id number] Patient Account Number: [id number] Date of Birth/Sex: 10-Jun-1950 (71 y.o. F) Treating RN: Carlene Coria Primary Care Tajon Moring: [name] Other Clinician: Referring Abrianna Sidman: Lanae Crumbly Treating Sapphira Harjo/Extender: Skipper Cliche in Treatment: 0 Abuse Risk Screen Items Answer ABUSE RISK SCREEN: Has anyone close to you tried to hurt or harm you recentlyo No Do you feel uncomfortable with anyone in your familyo No Has anyone forced you do things that you didnot want to doo No Electronic Signature(s) Signed: 12/01/2021 8:01:30 AM By: Carlene Coria RN Entered By: Carlene Coria on 11/30/2021 09:10:14 Cabriales, [name] ([id number]) -------------------------------------------------------------------------------- Activities of Daily Living Details Patient Name: [name]. Date of Service: 11/30/2021 8:45 AM Medical Record Number: [id number] Patient Account Number: [id number] Date of Birth/Sex: 1950-12-06 (71 y.o. F) Treating RN: Carlene Coria Primary Care Jhon Mallozzi: [name] Other Clinician: Referring Tahjir Silveria: Lanae Crumbly Treating Shineka Auble/Extender: Skipper Cliche in Treatment: 0 Activities of Daily Living Items Answer Activities of Daily Living (Please select one for each item) Drive Automobile Completely Able Take Medications Completely Able Use Telephone Completely Able Care for Appearance Completely Able Use Toilet Completely Able Bath / Shower Completely Able Dress Self Completely Able Feed Self Completely Able Walk Completely Able Get In / Out Bed Completely Able Housework Completely Able Prepare Meals Completely Able Handle Money Completely Able Shop for Self Completely Able Electronic Signature(s) Signed: 12/01/2021 8:01:30 AM By: Carlene Coria RN Entered By: Carlene Coria  on 11/30/2021 09:10:35 [place], [name] ([id number]) -------------------------------------------------------------------------------- Education Screening Details Patient Name: [name]. Date of Service: 11/30/2021 8:45 AM Medical Record Number: [id number] Patient Account Number: [id number] Date of Birth/Sex: November 19, 1950 (71 y.o. F) Treating RN: Carlene Coria Primary Care Mayu Ronk: [name] Other Clinician: Referring Renaye Janicki: Lanae Crumbly Treating Wayde Gopaul/Extender: Skipper Cliche in Treatment: 0 Primary Learner Assessed: Patient Learning Preferences/Education Level/Primary Language Learning Preference: Explanation Highest Education Level: College or Above Preferred Language: English Cognitive Barrier Language Barrier: No Translator Needed: No Memory Deficit: No Emotional Barrier: No Cultural/Religious Beliefs Affecting Medical Care: No Physical Barrier Impaired Vision: Yes Glasses Impaired Hearing: No Decreased Hand dexterity: No Knowledge/Comprehension Knowledge Level: Medium Comprehension Level: High Ability to understand written instructions: High Ability to understand verbal instructions: High Motivation Anxiety Level: Anxious Cooperation: Cooperative Education Importance: Acknowledges Need Interest in Health Problems: Asks Questions Perception: Coherent Willingness to Engage in Self-Management High Activities: Readiness to Engage in Self-Management High Activities: Electronic Signature(s) Signed: 12/01/2021 8:01:30 AM By: Carlene Coria RN Entered By: Carlene Coria on 11/30/2021 09:11:15 Biancardi, [name] ([id number]) -------------------------------------------------------------------------------- Fall Risk Assessment Details Patient Name: [name]. Date of Service: 11/30/2021 8:45 AM Medical Record Number: [id number] Patient Account Number: [id number] Date of Birth/Sex: May 13, 1951 (71 y.o. F) Treating RN: Carlene Coria Primary Care Tani Virgo:  [name] Other Clinician: Referring Donaldson Richter: Lanae Crumbly Treating Janessa Mickle/Extender: Skipper Cliche in Treatment: 0 Fall Risk Assessment Items Have you had 2 or more falls in the last 12 monthso 0 No Have you had any fall that resulted in injury in the last 12 monthso 0 No FALLS RISK SCREEN History of falling - immediate or within 3 months 0 No Secondary diagnosis (Do you have 2 or more medical diagnoseso) 0 No Ambulatory aid None/bed rest/wheelchair/nurse 0 No Crutches/cane/walker 0 No Furniture 0 No Intravenous therapy Access/Saline/Heparin Lock 0 No Gait/Transferring Normal/ bed rest/ wheelchair 0 No Weak (short steps  with or without shuffle, stooped but able to lift head while walking, may 0 No seek support from furniture) Impaired (short steps with shuffle, may have difficulty arising from chair, head down, impaired 0 No balance) Mental Status Oriented to own ability 0 No Electronic Signature(s) Signed: 12/01/2021 8:01:30 AM By: Carlene Coria RN Entered By: Carlene Coria on 11/30/2021 [place], [name] ([id number]) -------------------------------------------------------------------------------- Foot Assessment Details Patient Name: [name]. Date of Service: 11/30/2021 8:45 AM Medical Record Number: [id number] Patient Account Number: [id number] Date of Birth/Sex: 08-Jun-1950 (71 y.o. F) Treating RN: Carlene Coria Primary Care Annel Zunker: [name] Other Clinician: Referring Houda Brau: Lanae Crumbly Treating Hibah Odonnell/Extender: Skipper Cliche in Treatment: 0 Foot Assessment Items Site Locations + = Sensation present, - = Sensation absent, C = Callus, U = Ulcer R = Redness, W = Warmth, M = Maceration, PU = Pre-ulcerative lesion F = Fissure, S = Swelling, D = Dryness Assessment Right: Left: Other Deformity: No No Prior Foot Ulcer: No No Prior Amputation: Yes No Charcot Joint: No No Ambulatory Status: Ambulatory Without Help Gait:  Steady Electronic Signature(s) Signed: 12/01/2021 8:01:30 AM By: Carlene Coria RN Entered By: Carlene Coria on 11/30/2021 09:21:08 [place], [name] ([id number]) -------------------------------------------------------------------------------- Nutrition Risk Screening Details Patient Name: [name]. Date of Service: 11/30/2021 8:45 AM Medical Record Number: [id number] Patient Account Number: [id number] Date of Birth/Sex: May 26, 1951 (71 y.o. F) Treating RN: Carlene Coria Primary Care Kolby Schara: [name] Other Clinician: Referring Aleah Ahlgrim: Lanae Crumbly Treating Krystan Northrop/Extender: Skipper Cliche in Treatment: 0 Height (in): 63 Weight (lbs): 147 Body Mass Index (BMI): 26 Nutrition Risk Screening Items Score Screening NUTRITION RISK SCREEN: I have an illness or condition that made me change the kind and/or amount of food I eat 0 No I eat fewer than two meals per day 0 No I eat few fruits and vegetables, or milk products 0 No I have three or more drinks of beer, liquor or wine almost every day 0 No I have tooth or mouth problems that make it hard for me to eat 0 No I don't always have enough money to buy the food I need 0 No I eat alone most of the time 0 No I take three or more different prescribed or over-the-counter drugs a day 1 Yes Without wanting to, I have lost or gained 10 pounds in the last six months 0 No I am not always physically able to shop, cook and/or feed myself 0 No Nutrition Protocols Good Risk Protocol 0 No interventions needed Moderate Risk Protocol High Risk Proctocol Risk Level: Good Risk Score: 1 Electronic Signature(s) Signed: 12/01/2021 8:01:30 AM By: Carlene Coria RN Entered By: Carlene Coria on 11/30/2021 09:11:42

## 2021-12-01 NOTE — Progress Notes (Signed)
CYDNI, REDDOCH ([id number]) Visit Report for 11/30/2021 Allergy List Details Patient Name: [name], [name]. Date of Service: 11/30/2021 8:45 AM Medical Record Number: [id number] Patient Account Number: [id number] Date of Birth/Sex: April 02, 1951 (71 y.o. F) Treating RN: [name] Primary Care [name]: [name] Other Clinician: Referring [name]: [name] Treating [name]/Extender: [name] Weeks in Treatment: 0 Allergies Active Allergies latex Allergy Notes Electronic Signature(s) Signed: 12/01/2021 8:01:30 AM By: Brandy Coria RN Entered By: [name] on 11/30/2021 08:65:78 [name] ([id number]) -------------------------------------------------------------------------------- Arrival Information Details Patient Name: [name] Date of Service: 11/30/2021 8:45 AM Medical Record Number: [id number] Patient Account Number: [id number] Date of Birth/Sex: [birth year] (71 y.o. F) Treating RN: [name] Primary Care [name]: [name] Other Clinician: Referring [name]: [name] Treating [name]/Extender: [name] in Treatment: 0 Visit Information Patient Arrived: Ambulatory Arrival Time: 08:59 Accompanied By: daughter Transfer Assistance: None Patient Identification Verified: Yes Secondary Verification Process Completed: Yes Patient Requires Transmission-Based No Precautions: Patient Has Alerts: Yes Patient Alerts: Patient on Blood Thinner Electronic Signature(s) Signed: 12/01/2021 8:01:30 AM By: Brandy Coria RN Entered By: [name] on 11/30/2021 09:03:36 [name], [name] ([id number]) -------------------------------------------------------------------------------- Clinic Level of Care Assessment Details Patient Name: [name]. Date of Service: 11/30/2021 8:45 AM Medical Record Number: [id number] Patient Account Number: [id number] Date of Birth/Sex: 03/30/1951 (71 y.o. F) Treating RN: [name] Primary Care  [name]: [name] Other Clinician: Referring [name]: [name] Treating [name]/Extender: [name] in Treatment: 0 Clinic Level of Care Assessment Items TOOL 1 Quantity Score X - Use when EandM and Procedure is performed on INITIAL visit 1 0 ASSESSMENTS - Nursing Assessment / Reassessment X - General Physical Exam (combine w/ comprehensive assessment (listed just below) when performed on new 1 20 pt. evals) X- 1 25 Comprehensive Assessment (HX, ROS, Risk Assessments, Wounds Hx, etc.) ASSESSMENTS - Wound and Skin Assessment / Reassessment _0  - Dermatologic / Skin Assessment (not related to wound area) 0 ASSESSMENTS - Ostomy and/or Continence Assessment and Care _1  - Incontinence Assessment and Management 0 _2  - 0 Ostomy Care Assessment and Management (repouching, etc.) PROCESS - Coordination of Care X - Simple Patient / Family Education for ongoing care 1 15 _3  - 0 Complex (extensive) Patient / Family Education for ongoing care X- 1 10 Staff obtains Programmer, systems, Records, Test Results / Process Orders _4  - 0 Staff telephones HHA, Nursing Homes / Clarify orders / etc _5  - 0 Routine Transfer to another Facility (non-emergent condition) _6  - 0 Routine Hospital Admission (non-emergent condition) X- 1 15 New Admissions / Biomedical engineer / Ordering NPWT, Apligraf, etc. _7  - 0 Emergency Hospital Admission (emergent condition) PROCESS - Special Needs _8  - Pediatric / Minor Patient Management 0 _9  - 0 Isolation Patient Management _10  - 0 Hearing / Language / Visual special needs _11  - 0 Assessment of Community assistance (transportation, D/C planning, etc.) _12  - 0 Additional assistance / Altered mentation _13  - 0 Support Surface(s) Assessment (bed, cushion, seat, etc.) INTERVENTIONS - Miscellaneous _14  - External ear exam 0 _15  - 0 Patient Transfer (multiple staff / Civil Service fast streamer / Similar devices) _16  - 0 Simple Staple / Suture removal (25 or less) _17  -  0 Complex Staple / Suture removal (26 or more) _18  - 0 Hypo/Hyperglycemic Management (do not check if billed separately) X- 1 15 Ankle / Brachial Index (ABI) - do not check if billed separately Has the patient been seen at the hospital within the last three years: Yes Total Score:  100 Level Of Care: New/Established - Level 3 [name] ([id number]) Electronic Signature(s) Signed: 12/01/2021 8:01:30 AM By: Brandy Coria RN Entered By: [name] on 11/30/2021 09:54:49 [name] ([id number]) -------------------------------------------------------------------------------- Encounter Discharge Information Details Patient Name: [name]. Date of Service: 11/30/2021 8:45 AM Medical Record Number: [id number] Patient Account Number: [id number] Date of Birth/Sex: 29-Sep-1950 (71 y.o. F) Treating RN: [name] Primary Care Sebrina Kessner: [name] Other Clinician: Referring Chanese Hartsough: [name] Treating Malena Timpone/Extender: [name] in Treatment: 0 Encounter Discharge Information Items Post Procedure Vitals Discharge Condition: Stable Temperature (F): 97.8 Ambulatory Status: Ambulatory Pulse (bpm): 97 Discharge Destination: Home Respiratory Rate (breaths/min): 18 Transportation: Private Auto Blood Pressure (mmHg): 131/73 Accompanied By: daughter Schedule Follow-up Appointment: Yes Clinical Summary of Care: Electronic Signature(s) Signed: 12/01/2021 8:01:30 AM By: Brandy Coria RN Entered By: [name] on 11/30/2021 09:55:49 Dudenhoeffer, [name] ([id number]) -------------------------------------------------------------------------------- Lower Extremity Assessment Details Patient Name: [name]. Date of Service: 11/30/2021 8:45 AM Medical Record Number: [id number] Patient Account Number: [id number] Date of Birth/Sex: 08/10/1950 (71 y.o. F) Treating RN: [name] Primary Care Evangelynn Lochridge: [name] Other Clinician: Referring Oris Calmes: [name] Treating Vong Garringer/Extender: [name] in Treatment: 0 Edema Assessment Assessed: [Left: No] [Right: No] Edema: [Left: Ye] [Right: s] Calf Left: Right: Point of Measurement: 32 cm From Medial Instep 35 cm Ankle Left: Right: Point of Measurement: 10 cm From Medial Instep 21 cm Knee To Floor Left: Right: From Medial Instep 42 cm Vascular Assessment Pulses: Dorsalis Pedis Palpable: [Right:Yes] Electronic Signature(s) Signed: 12/01/2021 8:01:30 AM By: Brandy Coria RN Entered By: [name] on 11/30/2021 09:26:12 Platt, [name] ([id number]) -------------------------------------------------------------------------------- Multi Wound Chart Details Patient Name: [name]. Date of Service: 11/30/2021 8:45 AM Medical Record Number: [id number] Patient Account Number: [id number] Date of Birth/Sex: [birth year] (71 y.o. F) Treating RN: [name] Primary Care Roopa Graver: [name] Other Clinician: Referring Avedis Bevis: [name] Treating Kalieb Freeland/Extender: [name] in Treatment: 0 Vital Signs Height(in): 63 Pulse(bpm): 97 Weight(lbs): 147 Blood Pressure(mmHg): 131/73 Body Mass Index(BMI): 26 Temperature(F): 97.9 Respiratory Rate(breaths/min): 18 Photos: [N/A:N/A] Wound Location: Right, Medial Lower Leg Right, Lateral Lower Leg N/A Wounding Event: Gradually Appeared Gradually Appeared N/A Primary Etiology: Venous Leg Ulcer Venous Leg Ulcer N/A Comorbid History: Hypertension, Peripheral Arterial Hypertension, Peripheral Arterial N/A Disease Disease Date Acquired: 07/07/2021 07/08/2021 N/A Weeks of Treatment: 0 0 N/A Wound Status: Open Open N/A Wound Recurrence: No No N/A Measurements L x W x D (cm) 3x1.2x0.3 7x1.6x0.4 N/A Area (cm) : 2.827 8.796 N/A Volume (cm) : 0.848 3.519 N/A % Reduction in Area: N/A 0.00% N/A % Reduction in Volume: N/A 0.00% N/A Classification: Full Thickness Without Exposed Full Thickness Without Exposed N/A Support  Structures Support Structures Exudate Amount: Medium Medium N/A Exudate Type: Serosanguineous Serosanguineous N/A Exudate Color: red, brown red, brown N/A Granulation Amount: Small (1-33%) Medium (34-66%) N/A Granulation Quality: Pink Red N/A Necrotic Amount: Large (67-100%) Medium (34-66%) N/A Exposed Structures: Fat Layer (Subcutaneous Tissue): Fat Layer (Subcutaneous Tissue): N/A Yes Yes Fascia: No Fascia: No Tendon: No Tendon: No Muscle: No Muscle: No Joint: No Joint: No Bone: No Bone: No Epithelialization: None N/A N/A Treatment Notes Electronic Signature(s) Signed: 12/01/2021 8:01:30 AM By: Brandy Coria RN Entered By: [name] on 11/30/2021 09:45:26 Eshleman, [name] ([id number]) -------------------------------------------------------------------------------- [place] Details Patient Name: [name]. Date of Service: 11/30/2021 8:45 AM Medical Record Number: [id number] Patient Account Number: [id number] Date of Birth/Sex: [birth year] (71 y.o. F) Treating RN: [name]  Primary Care Ramsey Guadamuz: [name] Other Clinician: Referring Bita Cartwright: [name] Treating Devesh Monforte/Extender: [name] in Treatment: 0 Active Inactive Wound/Skin Impairment Nursing Diagnoses: Knowledge deficit related to ulceration/compromised skin integrity Goals: Patient/caregiver will verbalize understanding of skin care regimen Date Initiated: 11/30/2021 Target Resolution Date: 12/30/2021 Goal Status: Active Ulcer/skin breakdown will have a volume reduction of 30% by week 4 Date Initiated: 11/30/2021 Target Resolution Date: 01/30/2022 Goal Status: Active Ulcer/skin breakdown will have a volume reduction of 50% by week 8 Date Initiated: 11/30/2021 Target Resolution Date: 03/02/2022 Goal Status: Active Ulcer/skin breakdown will have a volume reduction of 80% by week 12 Date Initiated: 11/30/2021 Target Resolution Date: 04/01/2022 Goal Status:  Active Ulcer/skin breakdown will heal within 14 weeks Date Initiated: 11/30/2021 Target Resolution Date: 05/02/2022 Goal Status: Active Interventions: Assess patient/caregiver ability to obtain necessary supplies Assess patient/caregiver ability to perform ulcer/skin care regimen upon admission and as needed Assess ulceration(s) every visit Notes: Electronic Signature(s) Signed: 12/01/2021 8:01:30 AM By: Brandy Coria RN Entered By: [name] on 11/30/2021 09:45:14 Mcginniss, [name] ([id number]) -------------------------------------------------------------------------------- Pain Assessment Details Patient Name: [name]. Date of Service: 11/30/2021 8:45 AM Medical Record Number: [id number] Patient Account Number: [id number] Date of Birth/Sex: 1951-06-02 (71 y.o. F) Treating RN: [name] Primary Care Piper Hassebrock: [name] Other Clinician: Referring Tarsha Blando: [name] Treating Makaylie Dedeaux/Extender: [name] in Treatment: 0 Active Problems Location of Pain Severity and Description of Pain Patient Has Paino Yes Site Locations With Dressing Change: Yes Duration of the Pain. Constant / Intermittento Intermittent How Long Does it Lasto Hours: Minutes: 15 Rate the pain. Current Pain Level: 2 Worst Pain Level: 5 Least Pain Level: 0 Tolerable Pain Level: 5 Character of Pain Describe the Pain: Burning, Throbbing Pain Management and Medication Current Pain Management: Medication: Yes Cold Application: No Rest: Yes Massage: No Activity: No T.E.N.S.: No Heat Application: No Leg drop or elevation: No Is the Current Pain Management Adequate: Inadequate How does your wound impact your activities of daily livingo Sleep: No Bathing: No Appetite: No Relationship With Others: No Bladder Continence: No Emotions: No Bowel Continence: No Work: No Toileting: No Drive: No Dressing: No Hobbies: No Electronic Signature(s) Signed: 12/01/2021 8:01:30 AM By:  Brandy Coria RN Entered By: [name] on 11/30/2021 09:05:35 Blinder, [name] ([id number]) -------------------------------------------------------------------------------- Patient/Caregiver Education Details Patient Name: [name]. Date of Service: 11/30/2021 8:45 AM Medical Record Number: [id number] Patient Account Number: [id number] Date of Birth/Gender: Oct 13, 1950 (71 y.o. F) Treating RN: [name] Primary Care Physician: [name] Other Clinician: Referring Physician: Lanae Mccarthy Treating Physician/Extender: [name] in Treatment: 0 Education Assessment Education Provided To: Patient Education Topics Provided Wound/Skin Impairment: Methods: Explain/Verbal Responses: State content correctly Electronic Signature(s) Signed: 12/01/2021 8:01:30 AM By: Brandy Coria RN Entered By: [name] on 11/30/2021 09:55:11 Giacobbe, [name] ([id number]) -------------------------------------------------------------------------------- Wound Assessment Details Patient Name: [name]. Date of Service: 11/30/2021 8:45 AM Medical Record Number: [id number] Patient Account Number: [id number] Date of Birth/Sex: 07/17/1950 (71 y.o. F) Treating RN: [name] Primary Care Tandi Hanko: [name] Other Clinician: Referring Dwyane Dupree: [name] Treating Welma Mccombs/Extender: [name] in Treatment: 0 Wound Status Wound Number: 1 Primary Etiology: Venous Leg Ulcer Wound Location: Right, Medial Lower Leg Wound Status: Open Wounding Event: Gradually Appeared Comorbid History: Hypertension, Peripheral Arterial Disease Date Acquired: 07/07/2021 Weeks Of Treatment: 0 Clustered Wound: No Photos Wound Measurements Length: (cm) 3 Width: (cm) 1.2 Depth: (cm) 0.3 Area: (cm) 2.827 Volume: (cm) 0.848 % Reduction in Area: % Reduction  in Volume: Epithelialization: None Tunneling: No Undermining: No Wound Description Classification: Full Thickness Without  Exposed Support Structures Exudate Amount: Medium Exudate Type: Serosanguineous Exudate Color: red, brown Foul Odor After Cleansing: No Slough/Fibrino Yes Wound Bed Granulation Amount: Small (1-33%) Exposed Structure Granulation Quality: Pink Fascia Exposed: No Necrotic Amount: Large (67-100%) Fat Layer (Subcutaneous Tissue) Exposed: Yes Necrotic Quality: Adherent Slough Tendon Exposed: No Muscle Exposed: No Joint Exposed: No Bone Exposed: No Treatment Notes Wound #1 (Lower Leg) Wound Laterality: Right, Medial Cleanser Byram Ancillary Kit - 15 Day Supply Discharge Instruction: Use supplies as instructed; Kit contains: (15) Saline Bullets; (15) 3x3 Gauze; 15 pr Gloves [name]-Wound Care HAZLEY, DEZEEUW ([id number]) Topical Primary Dressing Silvercel 4 1/4x 4 1/4 (in/in) Discharge Instruction: Apply Silvercel 4 1/4x 4 1/4 (in/in) as instructed Secondary Dressing ABD Pad 5x9 (in/in) Discharge Instruction: Cover with ABD pad Secured With Compression Wrap Compression Stockings Add-Ons Electronic Signature(s) Signed: 12/01/2021 8:01:30 AM By: Brandy Coria RN Entered By: [name] on 11/30/2021 09:23:26 Srinivasan, [name] ([id number]) -------------------------------------------------------------------------------- Wound Assessment Details Patient Name: [name]. Date of Service: 11/30/2021 8:45 AM Medical Record Number: [id number] Patient Account Number: [id number] Date of Birth/Sex: 1951-06-03 (71 y.o. F) Treating RN: [name] Primary Care Isiac Breighner: [name] Other Clinician: Referring Romero Letizia: [name] Treating Greenleigh Kauth/Extender: [name] in Treatment: 0 Wound Status Wound Number: 2 Primary Etiology: Venous Leg Ulcer Wound Location: Right, Lateral Lower Leg Wound Status: Open Wounding Event: Gradually Appeared Comorbid History: Hypertension, Peripheral Arterial Disease Date Acquired: 07/08/2021 Weeks Of Treatment: 0 Clustered Wound:  No Photos Wound Measurements Length: (cm) 7 Width: (cm) 1.6 Depth: (cm) 0.4 Area: (cm) 8.796 Volume: (cm) 3.519 % Reduction in Area: 0% % Reduction in Volume: 0% Tunneling: No Undermining: No Wound Description Classification: Full Thickness Without Exposed Support Structures Exudate Amount: Medium Exudate Type: Serosanguineous Exudate Color: red, brown Foul Odor After Cleansing: No Slough/Fibrino Yes Wound Bed Granulation Amount: Medium (34-66%) Exposed Structure Granulation Quality: Red Fascia Exposed: No Necrotic Amount: Medium (34-66%) Fat Layer (Subcutaneous Tissue) Exposed: Yes Necrotic Quality: Adherent Slough Tendon Exposed: No Muscle Exposed: No Joint Exposed: No Bone Exposed: No Treatment Notes Wound #2 (Lower Leg) Wound Laterality: Right, Lateral Cleanser Byram Ancillary Kit - 15 Day Supply Discharge Instruction: Use supplies as instructed; Kit contains: (15) Saline Bullets; (15) 3x3 Gauze; 15 pr Gloves [name]-Wound Care RACHELANNE, WHIDBY ([id number]) Topical Primary Dressing Silvercel 4 1/4x 4 1/4 (in/in) Discharge Instruction: Apply Silvercel 4 1/4x 4 1/4 (in/in) as instructed tubi grip Discharge Instruction: size D Secondary Dressing ABD Pad 5x9 (in/in) Discharge Instruction: Cover with ABD pad tubi grip Discharge Instruction: size D Secured With Compression Wrap Compression Stockings Add-Ons Electronic Signature(s) Signed: 12/01/2021 8:01:30 AM By: Brandy Coria RN Entered By: [name] on 11/30/2021 09:24:47 Morrissey, [name] ([id number]) -------------------------------------------------------------------------------- Vitals Details Patient Name: [name]. Date of Service: 11/30/2021 8:45 AM Medical Record Number: [id number] Patient Account Number: [id number] Date of Birth/Sex: 05/22/1951 (71 y.o. F) Treating RN: [name] Primary Care Selvin Yun: [name] Other Clinician: Referring Gehrig Patras: [name] Treating  Trentyn Boisclair/Extender: [name] in Treatment: 0 Vital Signs Time Taken: 09:05 Temperature (F): 97.9 Height (in): 63 Pulse (bpm): 97 Source: Stated Respiratory Rate (breaths/min): 18 Weight (lbs): 147 Blood Pressure (mmHg): 131/73 Source: Stated Reference Range: 80 - 120 mg / dl Body Mass Index (BMI): 26 Electronic Signature(s) Signed: 12/01/2021 8:01:30 AM By: Brandy Coria RN Entered By: [name] on 11/30/2021 09:07:48

## 2021-12-01 NOTE — Progress Notes (Signed)
Office Note   HPI: [name] is a 71 y.o. ([birth year]) female presenting in follow s/p right leg SFA stenting to exclude the possible nidus for blue toe syndrome.   She was last seen in the hospital with superficial gangrenous ulcers in the pretibial distribution as well as gangrenous right third toe requiring amputation.  On exam today, [name] was doing well, accompanied by her son-in-law.  She has seen podiatry, dermatology, and is currently going to the wound care center for the pretibial ulcerations on her right foot.  She dermatology believes these lesions to be pyoderma.  Her right third toe amputation has healed nicely.  Overall she had no complaints, wondering when she could go back to having her part-time. Favour denies symptoms of claudication, ischemic rest pain, further tissue loss.  He is happy that her wounds are healing.  The pt is  on a statin for cholesterol management.  The pt is not on a daily aspirin.   Other AC:  - The pt is  on medication for hypertension.   The pt is  diabetic.  Tobacco hx:  none  Past Medical History:  Diagnosis Date   Arthritis    joints   Asthma    allergic   Breast cancer ([place])    Gastric mass 05/24/2012   GERD (gastroesophageal reflux disease)    H/O hiatal hernia    Heart murmur    not MVP no treatment   Hyperlipidemia    Hypertension    Insomnia    Osteopenia    Osteoporosis    PONV (postoperative nausea and vomiting)    Rosacea    neck,cheeks    Past Surgical History:  Procedure Laterality Date   ABDOMINAL AORTOGRAM W/LOWER EXTREMITY Right 08/25/2021   Procedure: ABDOMINAL AORTOGRAM W/LOWER EXTREMITY;  Surgeon: Broadus John, MD;  Location: [facility];  Service: Cardiovascular;  Laterality: Right;   ABDOMINAL HYSTERECTOMY     AMPUTATION TOE Right 11/11/2021   Procedure: AMPUTATION TOE;  Surgeon: Criselda Peaches, DPM;  Location: [facility];  Service: Podiatry;  Laterality: Right;  third toe   Bladder Tack      Blader Tack     BREAST LUMPECTOMY  04-1999   BREAST LUMPECTOMY  05-1999   COLON SURGERY  07-2010   COLONOSCOPY W/ POLYPECTOMY     EDG  with polypectomy (gastric polyps)     EUS  05/24/2012   Procedure: UPPER ENDOSCOPIC ULTRASOUND (EUS) LINEAR;  Surgeon: Milus Banister, MD;  Location: [facility];  Service: Endoscopy;  Laterality: N/A;   Lumpectomy 07-2000  2002   Port a cath Insertion  2001   PORT-A-CATH REMOVAL  05-2000   TONSILLECTOMY     WOUND DEBRIDEMENT Right 11/11/2021   Procedure: DEBRIDEMENT WOUND;  Surgeon: Criselda Peaches, DPM;  Location: [place];  Service: Podiatry;  Laterality: Right;    Social History   Socioeconomic History   Marital status: Widowed    Spouse name: Not on file   Number of children: Not on file   Years of education: Not on file   Highest education level: Not on file  Occupational History   Not on file  Tobacco Use   Smoking status: Never   Smokeless tobacco: Never  Vaping Use   Vaping Use: Not on file  Substance and Sexual Activity   Alcohol use: No   Drug use: No   Sexual activity: Not Currently    Birth control/protection: Surgical    Comment: hysterectomy  Other Topics Concern   Not on file  Social History Narrative   Not on file   Social Determinants of Health   Financial Resource Strain: Not on file  Food Insecurity: Not on file  Transportation Needs: Not on file  Physical Activity: Not on file  Stress: Not on file  Social Connections: Not on file  Intimate Partner Violence: Not on file   No family history on file.  Current Outpatient Medications  Medication Sig Dispense Refill   amitriptyline (ELAVIL) 75 MG tablet Take 75 mg by mouth at bedtime.     aspirin EC 81 MG tablet Take 81 mg by mouth daily. Swallow whole.     atorvastatin (LIPITOR) 20 MG tablet Take 20 mg by mouth at bedtime.     calcium carbonate (OS-CAL) 600 MG TABS Take 600 mg by mouth 2 (two) times daily with a meal. Vitamin D 400 mg     Cholecalciferol (VITAMIN  D3 PO) Take 5,000 Units by mouth at bedtime.     clopidogrel (PLAVIX) 75 MG tablet Take 1 tablet (75 mg total) by mouth daily. (Patient taking differently: Take 75 mg by mouth every evening.) 30 tablet 11   Coenzyme Q10 (COQ10 PO) Take 200 mg by mouth daily.     diphenoxylate-atropine (LOMOTIL) 2.5-0.025 MG tablet Take 1 tablet by mouth every 6 (six) hours.     fluticasone (FLONASE) 50 MCG/ACT nasal spray Place 1 spray into both nostrils daily as needed for allergies or rhinitis.     furosemide (LASIX) 40 MG tablet Take 1 tablet (40 mg total) by mouth daily. 30 tablet 1   gabapentin (NEURONTIN) 600 MG tablet Take 600 mg by mouth 3 (three) times daily.     losartan (COZAAR) 100 MG tablet Take 1 tablet (100 mg total) by mouth daily. 90 tablet 1   Lysine HCl 1000 MG TABS Take 1,000 mg by mouth daily as needed (wound healing).     MAGNESIUM PO Take 400 mg by mouth at bedtime.     montelukast (SINGULAIR) 10 MG tablet Take 10 mg by mouth daily.     Multiple Vitamin (MULTIVITAMIN) tablet Take 1 tablet by mouth daily.     mupirocin ointment (BACTROBAN) 2 % Apply 1 Application topically daily. 60 g 2   pantoprazole (PROTONIX) 40 MG tablet Take 40 mg by mouth daily.     promethazine (PHENERGAN) 25 MG tablet Take 25 mg by mouth every 6 (six) hours as needed for nausea/vomiting.     raloxifene (EVISTA) 60 MG tablet Take 60 mg by mouth daily.     senna-docusate (SENOKOT-S) 8.6-50 MG tablet Take 2 tablets by mouth at bedtime.     No current facility-administered medications for this visit.    Allergies  Allergen Reactions   Latex Itching   Levofloxacin Other (See Comments)    Possible tendon rupture   Morphine And Related Nausea Only     REVIEW OF SYSTEMS:   '[X]'$  denotes positive finding, '[ ]'$  denotes negative finding Cardiac  Comments:  Chest pain or chest pressure:    Shortness of breath upon exertion:    Short of breath when lying flat:    Irregular heart rhythm:        Vascular    Pain in  calf, thigh, or hip brought on by ambulation:    Pain in feet at night that wakes you up from your sleep:     Blood clot in your veins:    Leg swelling:  Pulmonary    Oxygen at home:    Productive cough:     Wheezing:         Neurologic    Sudden weakness in arms or legs:     Sudden numbness in arms or legs:     Sudden onset of difficulty speaking or slurred speech:    Temporary loss of vision in one eye:     Problems with dizziness:         Gastrointestinal    Blood in stool:     Vomited blood:         Genitourinary    Burning when urinating:     Blood in urine:        Psychiatric    Major depression:         Hematologic    Bleeding problems:    Problems with blood clotting too easily:        Skin    Rashes or ulcers:        Constitutional    Fever or chills:      PHYSICAL EXAMINATION:  There were no vitals filed for this visit.  General:  WDWN in NAD; vital signs documented above Gait: Not observed HENT: WNL, normocephalic Pulmonary: normal non-labored breathing , without wheezing Cardiac: regular HR,  Abdomen: soft, NT, no masses Skin: without rashes Vascular Exam/Pulses:  Right Left  Radial 2+ (normal) 2+ (normal)  Ulnar 2+ (normal) 2+ (normal)  Femoral 2+ (normal) 2+ (normal)  Popliteal    DP 2+ (normal) 2+ (normal)  PT     Extremities: with ischemic changes, without Gangrene , third toe amputation healed nicely.  Pretibial wounds much smaller than previous  musculoskeletal: no muscle wasting or atrophy Lymphedema appreciated in bilateral lower extremities  Neurologic: A&O X 3;  No focal weakness or paresthesias are detected Psychiatric:  The pt has Normal affect.   Non-Invasive Vascular Imaging:   ABI Findings:  +---------+------------------+-----+---------+--------+  Right    Rt Pressure (mmHg)IndexWaveform Comment   +---------+------------------+-----+---------+--------+  PTA      154               1.10 triphasic           +---------+------------------+-----+---------+--------+  DP       134               0.96 biphasic           +---------+------------------+-----+---------+--------+  Great Toe84                0.60                    +---------+------------------+-----+---------+--------+   +---------+------------------+-----+---------+-------+  Left     Lt Pressure (mmHg)IndexWaveform Comment  +---------+------------------+-----+---------+-------+  Brachial 140                                      +---------+------------------+-----+---------+-------+  PTA      163               1.16 triphasic         +---------+------------------+-----+---------+-------+  DP       143               1.02 biphasic          +---------+------------------+-----+---------+-------+  Great Toe71                0.51                   +---------+------------------+-----+---------+-------+   +-------+-----------+-----------+------------+------------+  ABI/TBIToday's ABIToday's TBIPrevious ABIPrevious TBI  +-------+-----------+-----------+------------+------------+  Right  1.1        0.6        1.09        0.45          +-------+-----------+-----------+------------+------------+  Left   1.16       0.51       1.05        0.12          +-------+-----------+-----------+------------+------------+     ASSESSMENT/PLAN: Winnifred Dufford is a 71 y.o. female presenting in follow s/p right leg SFA stenting to exclude the possible nidus for blue toe syndrome. She was last seen in the hospital with superficial gangrenous ulcers in the pretibial distribution as well as gangrenous right third toe requiring amputation.  Pressure wounds were diagnosed as pyoderma, which she is being treated at the wound care center.  Her right third toe amputation is healed nicely.  ABIs and duplex ultrasound of the right leg were reviewed demonstrating improved flow with widely patent  stent.  Had a long conversation with Airi regarding the above.  We discussed that healing pyoderma can take several months. I plan to see Kayla again in 1 year to follow the right SFA stent.  She was asked to call my office should any questions or concerns arise.   Broadus John, MD [facility] [phone]

## 2021-12-02 DIAGNOSIS — R2689 Other abnormalities of gait and mobility: Secondary | ICD-10-CM | POA: Diagnosis not present

## 2021-12-02 DIAGNOSIS — L02415 Cutaneous abscess of right lower limb: Secondary | ICD-10-CM | POA: Diagnosis not present

## 2021-12-02 DIAGNOSIS — M6281 Muscle weakness (generalized): Secondary | ICD-10-CM | POA: Diagnosis not present

## 2021-12-02 DIAGNOSIS — M79671 Pain in right foot: Secondary | ICD-10-CM | POA: Diagnosis not present

## 2021-12-02 DIAGNOSIS — M25674 Stiffness of right foot, not elsewhere classified: Secondary | ICD-10-CM | POA: Diagnosis not present

## 2021-12-03 ENCOUNTER — Encounter: Payer: Self-pay | Admitting: Vascular Surgery

## 2021-12-03 ENCOUNTER — Ambulatory Visit: Payer: Medicare HMO | Admitting: Vascular Surgery

## 2021-12-03 ENCOUNTER — Ambulatory Visit (HOSPITAL_COMMUNITY)
Admission: RE | Admit: 2021-12-03 | Discharge: 2021-12-03 | Disposition: A | Discharge status: Home / Self Care | Payer: Medicare HMO | Source: Ambulatory Visit | Attending: Vascular Surgery | Admitting: Vascular Surgery

## 2021-12-03 ENCOUNTER — Ambulatory Visit (INDEPENDENT_AMBULATORY_CARE_PROVIDER_SITE_OTHER)
Admission: RE | Admit: 2021-12-03 | Discharge: 2021-12-03 | Disposition: A | Discharge status: Home / Self Care | Payer: Medicare HMO | Source: Ambulatory Visit | Attending: Vascular Surgery | Admitting: Vascular Surgery

## 2021-12-03 VITALS — BP 129/84 | HR 82 | Temp 97.9°F | Resp 14 | Ht 63.0 in | Wt 143.0 lb

## 2021-12-03 DIAGNOSIS — I70261 Atherosclerosis of native arteries of extremities with gangrene, right leg: Secondary | ICD-10-CM | POA: Insufficient documentation

## 2021-12-03 DIAGNOSIS — I75021 Atheroembolism of right lower extremity: Secondary | ICD-10-CM | POA: Insufficient documentation

## 2021-12-03 DIAGNOSIS — I739 Peripheral vascular disease, unspecified: Secondary | ICD-10-CM | POA: Insufficient documentation

## 2021-12-03 NOTE — Progress Notes (Signed)
TERITA, HEJL ([id number]) Visit Report for 11/30/2021 Chief Complaint Document Details Patient Name: [name], [name]. Date of Service: 11/30/2021 8:45 AM Medical Record Number: [id number] Patient Account Number: [id number] Date of Birth/Sex: 1951-04-26 (71 y.o. F) Treating RN: Carlene Coria Primary Care Provider: [name] Other Clinician: Referring Provider: Lanae Crumbly Treating Provider/Extender: Skipper Cliche in Treatment: 0 Information Obtained from: Patient Chief Complaint Right LE Ulcers Electronic Signature(s) Signed: 11/30/2021 10:41:29 AM By: Worthy Keeler PA-C Entered By: Worthy Keeler on 11/30/2021 10:41:29 Daffron, [name] ([id number]) -------------------------------------------------------------------------------- Debridement Details Patient Name: [name]. Date of Service: 11/30/2021 8:45 AM Medical Record Number: [id number] Patient Account Number: [id number] Date of Birth/Sex: 16-Nov-1950 (71 y.o. F) Treating RN: Carlene Coria Primary Care Provider: [name] Other Clinician: Referring Provider: Lanae Crumbly Treating Provider/Extender: Skipper Cliche in Treatment: 0 Debridement Performed for Wound #1 Right,Medial Lower Leg Assessment: Performed By: Physician Tommie Sams., PA-C Debridement Type: Chemical/Enzymatic/Mechanical Agent Used: saline and gauze Severity of Tissue Pre Debridement: Fat layer exposed Level of Consciousness (Pre- Awake and Alert procedure): Pre-procedure Verification/Time Out Yes - 09:50 Taken: Start Time: 09:50 Instrument: Other : saline gauze Bleeding: Minimum Hemostasis Achieved: Pressure End Time: 09:52 Procedural Pain: 0 Post Procedural Pain: 0 Response to Treatment: Procedure was tolerated well Level of Consciousness (Post- Awake and Alert procedure): Post Debridement Measurements of Total Wound Length: (cm) 3 Width: (cm) 1.2 Depth: (cm) 0.3 Volume: (cm) 0.848 Character of Wound/Ulcer Post  Debridement: Improved Severity of Tissue Post Debridement: Fat layer exposed Post Procedure Diagnosis Same as Pre-procedure Electronic Signature(s) Signed: 12/01/2021 8:01:30 AM By: Carlene Coria RN Signed: 12/03/2021 4:59:17 PM By: Worthy Keeler PA-C Entered By: Carlene Coria on 11/30/2021 09:51:32 Splinter, [name] ([id number]) -------------------------------------------------------------------------------- Debridement Details Patient Name: [name]. Date of Service: 11/30/2021 8:45 AM Medical Record Number: [id number] Patient Account Number: [id number] Date of Birth/Sex: Dec 11, 1950 (71 y.o. F) Treating RN: Carlene Coria Primary Care Provider: [name] Other Clinician: Referring Provider: Lanae Crumbly Treating Provider/Extender: Skipper Cliche in Treatment: 0 Debridement Performed for Wound #2 Right,Lateral Lower Leg Assessment: Performed By: Physician Tommie Sams., PA-C Debridement Type: Chemical/Enzymatic/Mechanical Agent Used: saline and gauze Severity of Tissue Pre Debridement: Fat layer exposed Level of Consciousness (Pre- Awake and Alert procedure): Pre-procedure Verification/Time Out Yes - 09:50 Taken: Start Time: 09:50 Instrument: Other : saline gauze Bleeding: Minimum Hemostasis Achieved: Pressure End Time: 09:52 Procedural Pain: 0 Post Procedural Pain: 0 Response to Treatment: Procedure was tolerated well Level of Consciousness (Post- Awake and Alert procedure): Post Debridement Measurements of Total Wound Length: (cm) 7 Width: (cm) 1.6 Depth: (cm) 0.4 Volume: (cm) 3.519 Character of Wound/Ulcer Post Debridement: Improved Severity of Tissue Post Debridement: Fat layer exposed Post Procedure Diagnosis Same as Pre-procedure Electronic Signature(s) Signed: 12/01/2021 8:01:30 AM By: Carlene Coria RN Signed: 12/03/2021 4:59:17 PM By: Worthy Keeler PA-C Entered By: Carlene Coria on 11/30/2021 09:51:54 Stolar, [name]  ([id number]) -------------------------------------------------------------------------------- HPI Details Patient Name: [name]. Date of Service: 11/30/2021 8:45 AM Medical Record Number: [id number] Patient Account Number: [id number] Date of Birth/Sex: November 29, 1950 (71 y.o. F) Treating RN: Carlene Coria Primary Care Provider: [name] Other Clinician: Referring Provider: Lanae Crumbly Treating Provider/Extender: Skipper Cliche in Treatment: 0 History of Present Illness HPI Description: 11-30-2021 upon evaluation today patient presents for initial inspection here in our clinic concerning wounds that she has over the right lateral leg as well as the right medial leg. Subsequently the patient is dealing with lower  extremity edema which I do believe has been one of the big causes of what is going on here. She also does have what appears to be cellulitis of the leg as well as peripheral vascular disease. She already has amputation of a toe on the right foot. With that being said I did review that surgical pathology from the amputation this was the right third toe which showed a gangrenous ulcer with underlying cellulitis negative for acute osteomyelitis but acute cellulitis was present in the proximal margin. There was also noted on this pathology report a biopsy from the right leg which showed a gangrenous ulcer with underlying cellulitis and foreign body type giant cell reaction. That was performed on 11-11-2021 and resulted on 11-15-2021 She is also had lower extremity arterial duplex studies which did show that she had falsely elevated ABI on the right with a TBI of 0.45. On the left she had an ABI of 1.05 with a TBI of 0.12. She is pretty much monophasic throughout. With that being said there was definite reason for concern with regard to her blood flow into the legs she does have a follow-up with the vascular surgeon coming up shortly as well. Electronic Signature(s) Signed: 12/01/2021  5:15:47 PM By: Worthy Keeler PA-C Previous Signature: 11/30/2021 4:08:20 PM Version By: Worthy Keeler PA-C Entered By: Worthy Keeler on 12/01/2021 17:15:47 Gentle, [name] ([id number]) -------------------------------------------------------------------------------- Physical Exam Details Patient Name: [name] Date of Service: 11/30/2021 8:45 AM Medical Record Number: [id number] Patient Account Number: [id number] Date of Birth/Sex: 28-Sep-1950 (71 y.o. F) Treating RN: Carlene Coria Primary Care Provider: [name] Other Clinician: Referring Provider: Lanae Crumbly Treating Provider/Extender: Skipper Cliche in Treatment: 0 Constitutional sitting or standing blood pressure is within target range for patient.. pulse regular and within target range for patient.Marland Kitchen respirations regular, non- labored and within target range for patient.Marland Kitchen temperature within target range for patient.. Well-nourished and well-hydrated in no acute distress. Eyes conjunctiva clear no eyelid edema noted. pupils equal round and reactive to light and accommodation. Ears, Nose, Mouth, and Throat no gross abnormality of ear auricles or external auditory canals. normal hearing noted during conversation. mucus membranes moist. Respiratory normal breathing without difficulty. Cardiovascular 2+ dorsalis pedis/posterior tibialis pulses. no clubbing, cyanosis, significant edema, <3 sec cap refill. Musculoskeletal normal gait and posture. no significant deformity or arthritic changes, no loss or range of motion, no clubbing. Psychiatric this patient is able to make decisions and demonstrates good insight into disease process. Alert and Oriented x 3. pleasant and cooperative. Notes Upon inspection patient's wound bed actually does appear to show some necrotic tissue at this point. With that being said I am going to avoid sharp debridement right now while we wait on the vascular evaluation that she may need some  debridement going forward. Electronic Signature(s) Signed: 12/01/2021 5:29:17 PM By: Worthy Keeler PA-C Entered By: Worthy Keeler on 12/01/2021 17:29:17 Donald, [name] ([id number]) -------------------------------------------------------------------------------- Physician Orders Details Patient Name: [name]. Date of Service: 11/30/2021 8:45 AM Medical Record Number: [id number] Patient Account Number: [id number] Date of Birth/Sex: 21-Nov-1950 (71 y.o. F) Treating RN: Carlene Coria Primary Care Provider: [name] Other Clinician: Referring Provider: Lanae Crumbly Treating Provider/Extender: Skipper Cliche in Treatment: 0 Verbal / Phone Orders: No Diagnosis Coding Follow-up Appointments o Return Appointment in 2 weeks. Edema Control - Lymphedema / Segmental Compressive Device / Other o Tubigrip double layer applied - size D o Elevate, Exercise Daily and Avoid Standing for  Long Periods of Time. o Elevate legs to the level of the heart and pump ankles as often as possible o Elevate leg(s) parallel to the floor when sitting. Wound Treatment Wound #1 - Lower Leg Wound Laterality: Right, Medial Cleanser: Byram Ancillary Kit - 15 Day Supply (DME) (Generic) 3 x Per Day/30 Days Discharge Instructions: Use supplies as instructed; Kit contains: (15) Saline Bullets; (15) 3x3 Gauze; 15 pr Gloves Primary Dressing: Silvercel 4 1/4x 4 1/4 (in/in) (DME) (Generic) 3 x Per Day/30 Days Discharge Instructions: Apply Silvercel 4 1/4x 4 1/4 (in/in) as instructed Secondary Dressing: ABD Pad 5x9 (in/in) (DME) (Generic) 3 x Per Day/30 Days Discharge Instructions: Cover with ABD pad Wound #2 - Lower Leg Wound Laterality: Right, Lateral Cleanser: Byram Ancillary Kit - 15 Day Supply (DME) (Generic) 3 x Per Day/30 Days Discharge Instructions: Use supplies as instructed; Kit contains: (15) Saline Bullets; (15) 3x3 Gauze; 15 pr Gloves Primary Dressing: Silvercel 4 1/4x 4 1/4 (in/in)  (DME) (Generic) 3 x Per Day/30 Days Discharge Instructions: Apply Silvercel 4 1/4x 4 1/4 (in/in) as instructed Primary Dressing: tubi grip 3 x Per Day/30 Days Discharge Instructions: size D Secondary Dressing: ABD Pad 5x9 (in/in) (DME) (Generic) 3 x Per Day/30 Days Discharge Instructions: Cover with ABD pad Secondary Dressing: tubi grip 3 x Per Day/30 Days Discharge Instructions: size D Electronic Signature(s) Signed: 12/01/2021 8:01:30 AM By: Carlene Coria RN Signed: 12/03/2021 4:59:17 PM By: Worthy Keeler PA-C Entered By: Carlene Coria on 11/30/2021 09:54:28 Nakatani, [name] ([id number]) -------------------------------------------------------------------------------- Problem List Details Patient Name: [name]. Date of Service: 11/30/2021 8:45 AM Medical Record Number: [id number] Patient Account Number: [id number] Date of Birth/Sex: 1950-07-11 (71 y.o. F) Treating RN: Carlene Coria Primary Care Provider: [name] Other Clinician: Referring Provider: Lanae Crumbly Treating Provider/Extender: Skipper Cliche in Treatment: 0 Active Problems ICD-10 Encounter Code Description Active Date MDM Diagnosis I87.331 Chronic venous hypertension (idiopathic) with ulcer and inflammation of 11/30/2021 No Yes right lower extremity L97.812 Non-pressure chronic ulcer of other part of right lower leg with fat layer 11/30/2021 No Yes exposed L03.115 Cellulitis of right lower limb 11/30/2021 No Yes I73.89 Other specified peripheral vascular diseases 11/30/2021 No Yes Z89.421 Acquired absence of other right toe(s) 11/30/2021 No Yes Inactive Problems Resolved Problems Electronic Signature(s) Signed: 11/30/2021 10:41:18 AM By: Worthy Keeler PA-C Entered By: Worthy Keeler on 11/30/2021 10:41:18 Dolce, [name] ([id number]) -------------------------------------------------------------------------------- Progress Note Details Patient Name: [name]. Date of Service: 11/30/2021  8:45 AM Medical Record Number: [id number] Patient Account Number: [id number] Date of Birth/Sex: 03/24/1951 (71 y.o. F) Treating RN: Carlene Coria Primary Care Provider: [name] Other Clinician: Referring Provider: Lanae Crumbly Treating Provider/Extender: Skipper Cliche in Treatment: 0 Subjective Chief Complaint Information obtained from Patient Right LE Ulcers History of Present Illness (HPI) 11-30-2021 upon evaluation today patient presents for initial inspection here in our clinic concerning wounds that she has over the right lateral leg as well as the right medial leg. Subsequently the patient is dealing with lower extremity edema which I do believe has been one of the big causes of what is going on here. She also does have what appears to be cellulitis of the leg as well as peripheral vascular disease. She already has amputation of a toe on the right foot. With that being said I did review that surgical pathology from the amputation this was the right third toe which showed a gangrenous ulcer with underlying cellulitis negative for acute osteomyelitis but acute cellulitis  was present in the proximal margin. There was also noted on this pathology report a biopsy from the right leg which showed a gangrenous ulcer with underlying cellulitis and foreign body type giant cell reaction. That was performed on 11-11-2021 and resulted on 11-15-2021 She is also had lower extremity arterial duplex studies which did show that she had falsely elevated ABI on the right with a TBI of 0.45. On the left she had an ABI of 1.05 with a TBI of 0.12. She is pretty much monophasic throughout. With that being said there was definite reason for concern with regard to her blood flow into the legs she does have a follow-up with the vascular surgeon coming up shortly as well. Patient History Allergies latex Social History Never smoker, Marital Status - Widowed, Alcohol Use - Never, Drug Use - No History, Caffeine  Use - Moderate. Medical History Cardiovascular Patient has history of Hypertension, Peripheral Arterial Disease Review of Systems (ROS) Eyes Complains or has symptoms of Glasses / Contacts. Denies complaints or symptoms of Vision Changes. Integumentary (Skin) Complains or has symptoms of Wounds. Objective Constitutional sitting or standing blood pressure is within target range for patient.. pulse regular and within target range for patient.Marland Kitchen respirations regular, non- labored and within target range for patient.Marland Kitchen temperature within target range for patient.. Well-nourished and well-hydrated in no acute distress. Vitals Time Taken: 9:05 AM, Height: 63 in, Source: Stated, Weight: 147 lbs, Source: Stated, BMI: 26, Temperature: 97.9 F, Pulse: 97 bpm, Respiratory Rate: 18 breaths/min, Blood Pressure: 131/73 mmHg. Eyes conjunctiva clear no eyelid edema noted. pupils equal round and reactive to light and accommodation. Ears, Nose, Mouth, and Throat no gross abnormality of ear auricles or external auditory canals. normal hearing noted during conversation. mucus membranes moist. Respiratory Heinzelman, Mena K. ([id number]) normal breathing without difficulty. Cardiovascular 2+ dorsalis pedis/posterior tibialis pulses. no clubbing, cyanosis, significant edema, Musculoskeletal normal gait and posture. no significant deformity or arthritic changes, no loss or range of motion, no clubbing. Psychiatric this patient is able to make decisions and demonstrates good insight into disease process. Alert and Oriented x 3. pleasant and cooperative. General Notes: Upon inspection patient's wound bed actually does appear to show some necrotic tissue at this point. With that being said I am going to avoid sharp debridement right now while we wait on the vascular evaluation that she may need some debridement going forward. Integumentary (Hair, Skin) Wound #1 status is Open. Original cause of wound was  Gradually Appeared. The date acquired was: 07/07/2021. The wound is located on the Right,Medial Lower Leg. The wound measures 3cm length x 1.2cm width x 0.3cm depth; 2.827cm^2 area and 0.848cm^3 volume. There is Fat Layer (Subcutaneous Tissue) exposed. There is no tunneling or undermining noted. There is a medium amount of serosanguineous drainage noted. There is small (1-33%) pink granulation within the wound bed. There is a large (67-100%) amount of necrotic tissue within the wound bed including Adherent Slough. Wound #2 status is Open. Original cause of wound was Gradually Appeared. The date acquired was: 07/08/2021. The wound is located on the Right,Lateral Lower Leg. The wound measures 7cm length x 1.6cm width x 0.4cm depth; 8.796cm^2 area and 3.519cm^3 volume. There is Fat Layer (Subcutaneous Tissue) exposed. There is no tunneling or undermining noted. There is a medium amount of serosanguineous drainage noted. There is medium (34-66%) red granulation within the wound bed. There is a medium (34-66%) amount of necrotic tissue within the wound bed including Adherent Slough. Assessment Active Problems ICD-10  Chronic venous hypertension (idiopathic) with ulcer and inflammation of right lower extremity Non-pressure chronic ulcer of other part of right lower leg with fat layer exposed Cellulitis of right lower limb Other specified peripheral vascular diseases Acquired absence of other right toe(s) Procedures Wound #1 Pre-procedure diagnosis of Wound #1 is a Venous Leg Ulcer located on the Right,Medial Lower Leg .Severity of Tissue Pre Debridement is: Fat layer exposed. There was a Chemical/Enzymatic/Mechanical debridement performed by Tommie Sams., PA-C. With the following instrument(s): saline gauze to remove Viable and Non-Viable tissue/material. Material removed includes Subcutaneous Tissue. Other agent used was saline and gauze. A time out was conducted at 09:50, prior to the start of the  procedure. A Minimum amount of bleeding was controlled with Pressure. The procedure was tolerated well with a pain level of 0 throughout and a pain level of 0 following the procedure. Post Debridement Measurements: 3cm length x 1.2cm width x 0.3cm depth; 0.848cm^3 volume. Character of Wound/Ulcer Post Debridement is improved. Severity of Tissue Post Debridement is: Fat layer exposed. Post procedure Diagnosis Wound #1: Same as Pre-Procedure Wound #2 Pre-procedure diagnosis of Wound #2 is a Venous Leg Ulcer located on the Right,Lateral Lower Leg .Severity of Tissue Pre Debridement is: Fat layer exposed. There was a Excisional Skin/Subcutaneous Tissue Chemical/Enzymatic/Mechanical performed by Tommie Sams., PA-C. With the following instrument(s): saline gauze to remove Viable and Non-Viable tissue/material. Material removed includes Subcutaneous Tissue. Other agent used was saline and gauze. A time out was conducted at 09:50, prior to the start of the procedure. A Minimum amount of bleeding was controlled with Pressure. The procedure was tolerated well with a pain level of 0 throughout and a pain level of 0 following the procedure. Post Debridement Measurements: 7cm length x 1.6cm width x 0.4cm depth; 3.519cm^3 volume. Character of Wound/Ulcer Post Debridement is improved. Severity of Tissue Post Debridement is: Fat layer exposed. Post procedure Diagnosis Wound #2: Same as Pre-Procedure Plan [name], [name] ([id number]) Follow-up Appointments: Return Appointment in 2 weeks. Edema Control - Lymphedema / Segmental Compressive Device / Other: Tubigrip double layer applied - size D Elevate, Exercise Daily and Avoid Standing for Long Periods of Time. Elevate legs to the level of the heart and pump ankles as often as possible Elevate leg(s) parallel to the floor when sitting. WOUND #1: - Lower Leg Wound Laterality: Right, Medial Cleanser: Byram Ancillary Kit - 15 Day Supply (DME) (Generic) 3 x  Per Day/30 Days Discharge Instructions: Use supplies as instructed; Kit contains: (15) Saline Bullets; (15) 3x3 Gauze; 15 pr Gloves Primary Dressing: Silvercel 4 1/4x 4 1/4 (in/in) (DME) (Generic) 3 x Per Day/30 Days Discharge Instructions: Apply Silvercel 4 1/4x 4 1/4 (in/in) as instructed Secondary Dressing: ABD Pad 5x9 (in/in) (DME) (Generic) 3 x Per Day/30 Days Discharge Instructions: Cover with ABD pad WOUND #2: - Lower Leg Wound Laterality: Right, Lateral Cleanser: Byram Ancillary Kit - 15 Day Supply (DME) (Generic) 3 x Per Day/30 Days Discharge Instructions: Use supplies as instructed; Kit contains: (15) Saline Bullets; (15) 3x3 Gauze; 15 pr Gloves Primary Dressing: Silvercel 4 1/4x 4 1/4 (in/in) (DME) (Generic) 3 x Per Day/30 Days Discharge Instructions: Apply Silvercel 4 1/4x 4 1/4 (in/in) as instructed Primary Dressing: tubi grip 3 x Per Day/30 Days Discharge Instructions: size D Secondary Dressing: ABD Pad 5x9 (in/in) (DME) (Generic) 3 x Per Day/30 Days Discharge Instructions: Cover with ABD pad Secondary Dressing: tubi grip 3 x Per Day/30 Days Discharge Instructions: size D 1. I would recommend currently that  we go ahead and initiate treatment with a silver alginate dressing which I think is good to be the best way to go currently and the patient is in agreement with that plan. 2. I am going to suggest as well that we have the patient continue to monitor for any signs of worsening or infection obviously if anything changes she should let me know. 3. We will use Tubigrip to help with edema control. Follow-up 1 Electronic Signature(s) Signed: 12/01/2021 5:29:47 PM By: Worthy Keeler PA-C Entered By: Worthy Keeler on 12/01/2021 17:29:47 Harth, [name] ([id number]) -------------------------------------------------------------------------------- ROS/PFSH Details Patient Name: [name]. Date of Service: 11/30/2021 8:45 AM Medical Record Number: [id number] Patient  Account Number: [id number] Date of Birth/Sex: 1950/06/26 (71 y.o. F) Treating RN: Carlene Coria Primary Care Provider: [name] Other Clinician: Referring Provider: Lanae Crumbly Treating Provider/Extender: Skipper Cliche in Treatment: 0 Eyes Complaints and Symptoms: Positive for: Glasses / Contacts Negative for: Vision Changes Integumentary (Skin) Complaints and Symptoms: Positive for: Wounds Cardiovascular Medical History: Positive for: Hypertension; Peripheral Arterial Disease Immunizations Pneumococcal Vaccine: Received Pneumococcal Vaccination: Yes Received Pneumococcal Vaccination On or After 60th Birthday: Yes Implantable Devices None Family and Social History Never smoker; Marital Status - Widowed; Alcohol Use: Never; Drug Use: No History; Caffeine Use: Moderate Electronic Signature(s) Signed: 12/01/2021 8:01:30 AM By: Carlene Coria RN Signed: 12/03/2021 4:59:17 PM By: Worthy Keeler PA-C Entered By: Carlene Coria on 11/30/2021 09:10:06 Foglio, [name] ([id number]) -------------------------------------------------------------------------------- SuperBill Details Patient Name: [name]. Date of Service: 11/30/2021 Medical Record Number: [id number] Patient Account Number: [id number] Date of Birth/Sex: April 01, 1951 (71 y.o. 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 leg with fat layer exposed L03.115 Cellulitis of right lower limb I73.89 Other specified peripheral vascular diseases Z89.421 Acquired absence of other right toe(s) Facility Procedures CPT4 Code: [id number] Description: 99213 - WOUND CARE VISIT-LEV 3 EST  PT Modifier: Quantity: 1 Physician Procedures CPT4 Code Description: [id number] WC PHYS LEVEL 3 o NEW PT Modifier: Quantity: 1 CPT4 Code Description: ICD-10 Diagnosis Description I87.331 Chronic venous hypertension (idiopathic) with ulcer and inflammation L97.812 Non-pressure chronic ulcer of other part of right lower leg with fat L03.115 Cellulitis of right lower limb I73.89  Other specified peripheral vascular diseases Modifier: of right lower extremit layer exposed Quantity: y Engineer, maintenance) Signed: 12/01/2021 5:30:25 PM By: Worthy Keeler PA-C Previous Signature: 12/01/2021 8:01:30 AM Version By: Carlene Coria RN Entered By: Worthy Keeler on 12/01/2021 17:30:25

## 2021-12-08 DIAGNOSIS — M1990 Unspecified osteoarthritis, unspecified site: Secondary | ICD-10-CM | POA: Diagnosis not present

## 2021-12-08 DIAGNOSIS — C50919 Malignant neoplasm of unspecified site of unspecified female breast: Secondary | ICD-10-CM | POA: Diagnosis not present

## 2021-12-08 DIAGNOSIS — S98131A Complete traumatic amputation of one right lesser toe, initial encounter: Secondary | ICD-10-CM | POA: Diagnosis not present

## 2021-12-08 DIAGNOSIS — I10 Essential (primary) hypertension: Secondary | ICD-10-CM | POA: Diagnosis not present

## 2021-12-08 DIAGNOSIS — R7303 Prediabetes: Secondary | ICD-10-CM | POA: Diagnosis not present

## 2021-12-08 DIAGNOSIS — I96 Gangrene, not elsewhere classified: Secondary | ICD-10-CM | POA: Diagnosis not present

## 2021-12-08 DIAGNOSIS — D649 Anemia, unspecified: Secondary | ICD-10-CM | POA: Diagnosis not present

## 2021-12-08 DIAGNOSIS — E871 Hypo-osmolality and hyponatremia: Secondary | ICD-10-CM | POA: Diagnosis not present

## 2021-12-09 DIAGNOSIS — M6281 Muscle weakness (generalized): Secondary | ICD-10-CM | POA: Diagnosis not present

## 2021-12-09 DIAGNOSIS — R2689 Other abnormalities of gait and mobility: Secondary | ICD-10-CM | POA: Diagnosis not present

## 2021-12-09 DIAGNOSIS — M25674 Stiffness of right foot, not elsewhere classified: Secondary | ICD-10-CM | POA: Diagnosis not present

## 2021-12-09 DIAGNOSIS — M79671 Pain in right foot: Secondary | ICD-10-CM | POA: Diagnosis not present

## 2021-12-13 DIAGNOSIS — R2689 Other abnormalities of gait and mobility: Secondary | ICD-10-CM | POA: Diagnosis not present

## 2021-12-13 DIAGNOSIS — M79671 Pain in right foot: Secondary | ICD-10-CM | POA: Diagnosis not present

## 2021-12-13 DIAGNOSIS — M25674 Stiffness of right foot, not elsewhere classified: Secondary | ICD-10-CM | POA: Diagnosis not present

## 2021-12-13 DIAGNOSIS — M6281 Muscle weakness (generalized): Secondary | ICD-10-CM | POA: Diagnosis not present

## 2021-12-14 ENCOUNTER — Encounter: Payer: Medicare HMO | Attending: Physician Assistant | Admitting: Physician Assistant

## 2021-12-14 DIAGNOSIS — L97812 Non-pressure chronic ulcer of other part of right lower leg with fat layer exposed: Secondary | ICD-10-CM | POA: Diagnosis not present

## 2021-12-14 DIAGNOSIS — Z89421 Acquired absence of other right toe(s): Secondary | ICD-10-CM | POA: Diagnosis not present

## 2021-12-14 DIAGNOSIS — I872 Venous insufficiency (chronic) (peripheral): Secondary | ICD-10-CM | POA: Diagnosis not present

## 2021-12-14 DIAGNOSIS — L03115 Cellulitis of right lower limb: Secondary | ICD-10-CM | POA: Insufficient documentation

## 2021-12-14 DIAGNOSIS — I7389 Other specified peripheral vascular diseases: Secondary | ICD-10-CM | POA: Insufficient documentation

## 2021-12-14 DIAGNOSIS — I87331 Chronic venous hypertension (idiopathic) with ulcer and inflammation of right lower extremity: Secondary | ICD-10-CM | POA: Diagnosis not present

## 2021-12-14 NOTE — Progress Notes (Signed)
GILA, LAUF ([id number]) Visit Report for 12/14/2021 Arrival Information Details Patient Name: [name], [name]. Date of Service: 12/14/2021 9:15 AM Medical Record Number: [id number] Patient Account Number: [id number] Date of Birth/Sex: January 22, 1951 (71 y.o. F) Treating RN: Levora Dredge Primary Care Ersilia Brawley: [name] Other Clinician: Referring Yadira Hada: [name] Treating Karsynn Deweese/Extender: Skipper Cliche in Treatment: 2 Visit Information History Since Last Visit Added or deleted any medications: No Patient Arrived: Ambulatory Any new allergies or adverse reactions: No Arrival Time: 09:39 Had a fall or experienced change in No Accompanied By: family activities of daily living that may affect Transfer Assistance: None risk of falls: Patient Identification Verified: Yes Hospitalized since last visit: No Secondary Verification Process Completed: Yes Has Dressing in Place as Prescribed: Yes Patient Requires Transmission-Based No Has Compression in Place as Prescribed: Yes Precautions: Pain Present Now: Yes Patient Has Alerts: Yes Patient Alerts: Patient on Blood Thinner Electronic Signature(s) Signed: 12/14/2021 4:07:49 PM By: Levora Dredge Entered By: Levora Dredge on 12/14/2021 09:39:44 [name], [name] ([id number]) -------------------------------------------------------------------------------- Clinic Level of Care Assessment Details Patient Name: [name]. Date of Service: 12/14/2021 9:15 AM Medical Record Number: [id number] Patient Account Number: [id number] Date of Birth/Sex: [birth year] (71 y.o. F) Treating RN: Levora Dredge Primary Care Farzana Koci: [name] Other Clinician: Referring Durante Violett: [name] Treating Larenda Reedy/Extender: Skipper Cliche in Treatment: 2 Clinic Level of Care Assessment Items TOOL 1 Quantity Score '[]'$  - Use when EandM and Procedure is performed on INITIAL visit 0 ASSESSMENTS - Nursing Assessment / Reassessment '[]'$   - General Physical Exam (combine w/ comprehensive assessment (listed just below) when performed on new 0 pt. evals) '[]'$  - 0 Comprehensive Assessment (HX, ROS, Risk Assessments, Wounds Hx, etc.) ASSESSMENTS - Wound and Skin Assessment / Reassessment '[]'$  - Dermatologic / Skin Assessment (not related to wound area) 0 ASSESSMENTS - Ostomy and/or Continence Assessment and Care '[]'$  - Incontinence Assessment and Management 0 '[]'$  - 0 Ostomy Care Assessment and Management (repouching, etc.) PROCESS - Coordination of Care '[]'$  - Simple Patient / Family Education for ongoing care 0 '[]'$  - 0 Complex (extensive) Patient / Family Education for ongoing care '[]'$  - 0 Staff obtains Programmer, systems, Records, Test Results / Process Orders '[]'$  - 0 Staff telephones HHA, Nursing Homes / Clarify orders / etc '[]'$  - 0 Routine Transfer to another Facility (non-emergent condition) '[]'$  - 0 Routine Hospital Admission (non-emergent condition) '[]'$  - 0 New Admissions / Biomedical engineer / Ordering NPWT, Apligraf, etc. '[]'$  - 0 Emergency Hospital Admission (emergent condition) PROCESS - Special Needs '[]'$  - Pediatric / Minor Patient Management 0 '[]'$  - 0 Isolation Patient Management '[]'$  - 0 Hearing / Language / Visual special needs '[]'$  - 0 Assessment of Community assistance (transportation, D/C planning, etc.) '[]'$  - 0 Additional assistance / Altered mentation '[]'$  - 0 Support Surface(s) Assessment (bed, cushion, seat, etc.) INTERVENTIONS - Miscellaneous '[]'$  - External ear exam 0 '[]'$  - 0 Patient Transfer (multiple staff / Civil Service fast streamer / Similar devices) '[]'$  - 0 Simple Staple / Suture removal (25 or less) '[]'$  - 0 Complex Staple / Suture removal (26 or more) '[]'$  - 0 Hypo/Hyperglycemic Management (do not check if billed separately) '[]'$  - 0 Ankle / Brachial Index (ABI) - do not check if billed separately Has the patient been seen at the hospital within the last three years: Yes Total Score: 0 Level Of Care: ____ [name]  ([id number]) Electronic Signature(s) Signed: 12/14/2021 4:07:49 PM By: Levora Dredge Entered By: Levora Dredge on 12/14/2021 09:50:46 [name],  [name] ([id number]) -------------------------------------------------------------------------------- Encounter Discharge Information Details Patient Name: [name], [name]. Date of Service: 12/14/2021 9:15 AM Medical Record Number: [id number] Patient Account Number: [id number] Date of Birth/Sex: 08/17/1950 (71 y.o. F) Treating RN: Levora Dredge Primary Care Andrej Spagnoli: [name] Other Clinician: Referring Debe Anfinson: [name] Treating Tommy Minichiello/Extender: Skipper Cliche in Treatment: 2 Encounter Discharge Information Items Post Procedure Vitals Discharge Condition: Stable Temperature (F): 98.2 Ambulatory Status: Ambulatory Pulse (bpm): 98 Discharge Destination: Home Respiratory Rate (breaths/min): 18 Transportation: Private Auto Blood Pressure (mmHg): 144/78 Accompanied By: family Schedule Follow-up Appointment: Yes Clinical Summary of Care: Electronic Signature(s) Signed: 12/14/2021 1:32:40 PM By: Levora Dredge Entered By: Levora Dredge on 12/14/2021 13:32:39 [name], [name] ([id number]) -------------------------------------------------------------------------------- Lower Extremity Assessment Details Patient Name: [name]. Date of Service: 12/14/2021 9:15 AM Medical Record Number: [id number] Patient Account Number: [id number] Date of Birth/Sex: January 22, 1951 (71 y.o. F) Treating RN: Levora Dredge Primary Care Rhodes Calvert: [name] Other Clinician: Referring Ezrah Panning: [name] Treating Jnaya Butrick/Extender: Skipper Cliche in Treatment: 2 Edema Assessment Assessed: [Left: No] [Right: No] Edema: [Left: Ye] [Right: s] Calf Left: Right: Point of Measurement: 32 cm From Medial Instep 30.5 cm Ankle Left: Right: Point of Measurement: 10 cm From Medial Instep 20.5 cm Vascular Assessment Pulses: Dorsalis  Pedis Palpable: [Right:Yes] Notes pedal pulse palpable but weak Electronic Signature(s) Signed: 12/14/2021 4:07:49 PM By: Levora Dredge Entered By: Levora Dredge on 12/14/2021 09:44:58 Laflam, [name] ([id number]) -------------------------------------------------------------------------------- Multi Wound Chart Details Patient Name: [name]. Date of Service: 12/14/2021 9:15 AM Medical Record Number: [id number] Patient Account Number: [id number] Date of Birth/Sex: [birth year] (71 y.o. F) Treating RN: Levora Dredge Primary Care Caedyn Tassinari: [name] Other Clinician: Referring Braleigh Massoud: [name] Treating Kayvon Mo/Extender: Skipper Cliche in Treatment: 2 Vital Signs Height(in): 66 Pulse(bpm): 22 Weight(lbs): 147 Blood Pressure(mmHg): 144/78 Body Mass Index(BMI): 26 Temperature(F): 98.2 Respiratory Rate(breaths/min): 18 Photos: [N/A:N/A] Wound Location: Right, Medial Lower Leg Right, Lateral Lower Leg N/A Wounding Event: Gradually Appeared Gradually Appeared N/A Primary Etiology: Venous Leg Ulcer Venous Leg Ulcer N/A Comorbid History: Hypertension, Peripheral Arterial Hypertension, Peripheral Arterial N/A Disease Disease Date Acquired: 07/07/2021 07/08/2021 N/A Weeks of Treatment: 2 2 N/A Wound Status: Open Open N/A Wound Recurrence: No No N/A Measurements L x W x D (cm) 1.5x1x0.1 6.8x1.4x0.3 N/A Area (cm) : 1.178 7.477 N/A Volume (cm) : 0.118 2.243 N/A % Reduction in Area: 58.30% 15.00% N/A % Reduction in Volume: 86.10% 36.30% N/A Classification: Full Thickness Without Exposed Full Thickness Without Exposed N/A Support Structures Support Structures Exudate Amount: Medium Medium N/A Exudate Type: Serosanguineous Serosanguineous N/A Exudate Color: red, brown red, brown N/A Granulation Amount: Small (1-33%) Small (1-33%) N/A Granulation Quality: Pink Red N/A Necrotic Amount: Large (67-100%) Large (67-100%) N/A Exposed Structures: Fat Layer (Subcutaneous  Tissue): Fat Layer (Subcutaneous Tissue): N/A Yes Yes Fascia: No Fascia: No Tendon: No Tendon: No Muscle: No Muscle: No Joint: No Joint: No Bone: No Bone: No Epithelialization: None None N/A Assessment Notes: several spots have opened up, PA N/A N/A Stone aware Treatment Notes Electronic Signature(s) Signed: 12/14/2021 4:07:49 PM By: Levora Dredge Entered By: Levora Dredge on 12/14/2021 09:45:10 Jeon, [name] ([id number]) [place], [name] ([id number]) -------------------------------------------------------------------------------- [place] Details Patient Name: [name]. Date of Service: 12/14/2021 9:15 AM Medical Record Number: [id number] Patient Account Number: [id number] Date of Birth/Sex: [birth year] (71 y.o. F) Treating RN: Levora Dredge Primary Care Lenoard Helbert: [name] Other Clinician: Referring Yariel Ferraris: [name] Treating Thornton Dohrmann/Extender: Skipper Cliche in Treatment: 2 Active Inactive  Wound/Skin Impairment Nursing Diagnoses: Knowledge deficit related to ulceration/compromised skin integrity Goals: Patient/caregiver will verbalize understanding of skin care regimen Date Initiated: 11/30/2021 Target Resolution Date: 12/30/2021 Goal Status: Active Ulcer/skin breakdown will have a volume reduction of 30% by week 4 Date Initiated: 11/30/2021 Target Resolution Date: 01/30/2022 Goal Status: Active Ulcer/skin breakdown will have a volume reduction of 50% by week 8 Date Initiated: 11/30/2021 Target Resolution Date: 03/02/2022 Goal Status: Active Ulcer/skin breakdown will have a volume reduction of 80% by week 12 Date Initiated: 11/30/2021 Target Resolution Date: 04/01/2022 Goal Status: Active Ulcer/skin breakdown will heal within 14 weeks Date Initiated: 11/30/2021 Target Resolution Date: 05/02/2022 Goal Status: Active Interventions: Assess patient/caregiver ability to obtain necessary supplies Assess patient/caregiver  ability to perform ulcer/skin care regimen upon admission and as needed Assess ulceration(s) every visit Notes: Electronic Signature(s) Signed: 12/14/2021 4:07:49 PM By: Levora Dredge Entered By: Levora Dredge on 12/14/2021 09:45:02 Adolph, [name] ([id number]) -------------------------------------------------------------------------------- Pain Assessment Details Patient Name: [name]. Date of Service: 12/14/2021 9:15 AM Medical Record Number: [id number] Patient Account Number: [id number] Date of Birth/Sex: 06-28-1950 (71 y.o. F) Treating RN: Levora Dredge Primary Care Marshea Wisher: [name] Other Clinician: Referring Jatavion Peaster: [name] Treating Erendira Crabtree/Extender: Skipper Cliche in Treatment: 2 Active Problems Location of Pain Severity and Description of Pain Patient Has Paino Yes Site Locations Rate the pain. Current Pain Level: 4 Pain Management and Medication Current Pain Management: Notes pt states pain in right lower leg wounds Electronic Signature(s) Signed: 12/14/2021 4:07:49 PM By: Levora Dredge Entered By: Levora Dredge on 12/14/2021 09:40:20 Saidi, [name] ([id number]) -------------------------------------------------------------------------------- Patient/Caregiver Education Details Patient Name: [name]. Date of Service: 12/14/2021 9:15 AM Medical Record Number: [id number] Patient Account Number: [id number] Date of Birth/Gender: 02-Oct-1950 (71 y.o. F) Treating RN: Levora Dredge Primary Care Physician: [name] Other Clinician: Referring Physician: [name] Treating Physician/Extender: Skipper Cliche in Treatment: 2 Education Assessment Education Provided To: Patient and Caregiver Education Topics Provided Wound/Skin Impairment: Handouts: Caring for Your Ulcer Methods: Explain/Verbal Responses: State content correctly Electronic Signature(s) Signed: 12/14/2021 4:07:49 PM By: Levora Dredge Entered By: Levora Dredge on 12/14/2021 13:31:46 [place], [name] ([id number]) -------------------------------------------------------------------------------- Wound Assessment Details Patient Name: [name]. Date of Service: 12/14/2021 9:15 AM Medical Record Number: [id number] Patient Account Number: [id number] Date of Birth/Sex: 1951/02/25 (71 y.o. F) Treating RN: Levora Dredge Primary Care Antoino Westhoff: [name] Other Clinician: Referring Purnell Daigle: [name] Treating Zamarah Ullmer/Extender: Skipper Cliche in Treatment: 2 Wound Status Wound Number: 1 Primary Etiology: Venous Leg Ulcer Wound Location: Right, Medial Lower Leg Wound Status: Open Wounding Event: Gradually Appeared Comorbid History: Hypertension, Peripheral Arterial Disease Date Acquired: 07/07/2021 Weeks Of Treatment: 2 Clustered Wound: No Photos Wound Measurements Length: (cm) 1.5 Width: (cm) 1 Depth: (cm) 0.1 Area: (cm) 1.178 Volume: (cm) 0.118 % Reduction in Area: 58.3% % Reduction in Volume: 86.1% Epithelialization: None Tunneling: No Undermining: No Wound Description Classification: Full Thickness Without Exposed Support Structures Exudate Amount: Medium Exudate Type: Serosanguineous Exudate Color: red, brown Foul Odor After Cleansing: No Slough/Fibrino Yes Wound Bed Granulation Amount: Small (1-33%) Exposed Structure Granulation Quality: Pink Fascia Exposed: No Necrotic Amount: Large (67-100%) Fat Layer (Subcutaneous Tissue) Exposed: Yes Necrotic Quality: Adherent Slough Tendon Exposed: No Muscle Exposed: No Joint Exposed: No Bone Exposed: No Assessment Notes several spots have opened up, PA Stone aware Treatment Notes Wound #1 (Lower Leg) Wound Laterality: Right, Medial Cleanser Hibiclens, 16 (oz) [name], [name] ([id number]) Discharge Instruction: Put Hibiclenso on your skin and rub it  in gently for five minutes with a washcloth. Turn the water back on and rinse very well with warm water. Do  not use your regular soap after using and rinsing Hibiclenso. Pat yourself dry with a clean towel. Peri-Wound Care Topical Primary Dressing Silvercel 4 1/4x 4 1/4 (in/in) Discharge Instruction: Apply Silvercel 4 1/4x 4 1/4 (in/in) as instructed Secondary Dressing ABD Pad 5x9 (in/in) Discharge Instruction: Cover with ABD pad Secured With [place] Soft Cloth Surgical Tape, 2x2 (in/yd) Conform 4'' - Conforming Stretch Gauze Bandage 4x75 (in/in) Discharge Instruction: Apply as directed Tubigrip Size D, 3x10 (in/yd) Discharge Instruction: double layer Compression Wrap Compression Stockings Add-Ons Electronic Signature(s) Signed: 12/14/2021 4:07:49 PM By: Levora Dredge Entered By: Levora Dredge on 12/14/2021 09:43:23 Krauter, [name] ([id number]) -------------------------------------------------------------------------------- Wound Assessment Details Patient Name: [name]. Date of Service: 12/14/2021 9:15 AM Medical Record Number: [id number] Patient Account Number: [id number] Date of Birth/Sex: [birth year] (71 y.o. F) Treating RN: Levora Dredge Primary Care Nixon Kolton: [name] Other Clinician: Referring Evony Rezek: [name] Treating Roselynne Lortz/Extender: Skipper Cliche in Treatment: 2 Wound Status Wound Number: 2 Primary Etiology: Venous Leg Ulcer Wound Location: Right, Lateral Lower Leg Wound Status: Open Wounding Event: Gradually Appeared Comorbid History: Hypertension, Peripheral Arterial Disease Date Acquired: 07/08/2021 Weeks Of Treatment: 2 Clustered Wound: No Photos Wound Measurements Length: (cm) 6.8 Width: (cm) 1.4 Depth: (cm) 0.3 Area: (cm) 7.477 Volume: (cm) 2.243 % Reduction in Area: 15% % Reduction in Volume: 36.3% Epithelialization: None Tunneling: No Undermining: No Wound Description Classification: Full Thickness Without Exposed Support Structures Exudate Amount: Medium Exudate Type: Serosanguineous Exudate Color:  red, brown Foul Odor After Cleansing: No Slough/Fibrino Yes Wound Bed Granulation Amount: Small (1-33%) Exposed Structure Granulation Quality: Red Fascia Exposed: No Necrotic Amount: Large (67-100%) Fat Layer (Subcutaneous Tissue) Exposed: Yes Necrotic Quality: Adherent Slough Tendon Exposed: No Muscle Exposed: No Joint Exposed: No Bone Exposed: No Treatment Notes Wound #2 (Lower Leg) Wound Laterality: Right, Lateral Cleanser Hibiclens, 16 (oz) Discharge Instruction: Put Hibiclenso on your skin and rub it in gently for five minutes with a washcloth. Turn the water back on and rinse very well with warm water. Do not use your regular soap after using and rinsing Hibiclenso. Pat yourself dry with a clean towel. Peri-Wound Care DAWNMARIE, BREON ([id number]) Topical Primary Dressing Silvercel 4 1/4x 4 1/4 (in/in) Discharge Instruction: Apply Silvercel 4 1/4x 4 1/4 (in/in) as instructed Secondary Dressing ABD Pad 5x9 (in/in) Discharge Instruction: Cover with ABD pad Secured With [place] Surgical Tape, 2x2 (in/yd) Conform 4'' - Conforming Stretch Gauze Bandage 4x75 (in/in) Discharge Instruction: Apply as directed Tubigrip Size D, 3x10 (in/yd) Discharge Instruction: double layer Compression Wrap Compression Stockings Add-Ons Electronic Signature(s) Signed: 12/14/2021 4:07:49 PM By: Levora Dredge Entered By: Levora Dredge on 12/14/2021 09:42:34 Hohler, [name] ([id number]) -------------------------------------------------------------------------------- Vitals Details Patient Name: [name]. Date of Service: 12/14/2021 9:15 AM Medical Record Number: [id number] Patient Account Number: [id number] Date of Birth/Sex: 12/19/1950 (71 y.o. F) Treating RN: Levora Dredge Primary Care Dontrez Pettis: [name] Other Clinician: Referring Cortez Flippen: [name] Treating Naureen Benton/Extender: Skipper Cliche in Treatment: 2 Vital Signs Time Taken:  09:25 Temperature (F): 98.2 Height (in): 63 Pulse (bpm): 98 Weight (lbs): 147 Respiratory Rate (breaths/min): 18 Body Mass Index (BMI): 26 Blood Pressure (mmHg): 144/78 Reference Range: 80 - 120 mg / dl Electronic Signature(s) Signed: 12/14/2021 4:07:49 PM By: Levora Dredge Entered By: Levora Dredge on 12/14/2021 09:40:04

## 2021-12-15 ENCOUNTER — Ambulatory Visit (INDEPENDENT_AMBULATORY_CARE_PROVIDER_SITE_OTHER): Payer: Medicare HMO | Admitting: Podiatry

## 2021-12-15 ENCOUNTER — Other Ambulatory Visit: Payer: Medicare HMO

## 2021-12-15 ENCOUNTER — Encounter: Payer: Self-pay | Admitting: Podiatry

## 2021-12-15 DIAGNOSIS — L88 Pyoderma gangrenosum: Secondary | ICD-10-CM | POA: Diagnosis not present

## 2021-12-15 DIAGNOSIS — Z89421 Acquired absence of other right toe(s): Secondary | ICD-10-CM

## 2021-12-16 DIAGNOSIS — M6281 Muscle weakness (generalized): Secondary | ICD-10-CM | POA: Diagnosis not present

## 2021-12-16 DIAGNOSIS — M79671 Pain in right foot: Secondary | ICD-10-CM | POA: Diagnosis not present

## 2021-12-16 DIAGNOSIS — R2689 Other abnormalities of gait and mobility: Secondary | ICD-10-CM | POA: Diagnosis not present

## 2021-12-16 DIAGNOSIS — M25674 Stiffness of right foot, not elsewhere classified: Secondary | ICD-10-CM | POA: Diagnosis not present

## 2021-12-16 NOTE — Progress Notes (Signed)
[name], [name] ([id number]) Visit Report for 12/14/2021 Chief Complaint Document Details Patient Name: [name], [name]. Date of Service: 12/14/2021 9:15 AM Medical Record Number: [id number] Patient Account Number: [id number] Date of Birth/Sex: 04/27/1951 (71 y.o. F) Treating RN: Levora Dredge Primary Care Provider: [name] Other Clinician: Referring Provider: [name] Treating Provider/Extender: Skipper Cliche in Treatment: 2 Information Obtained from: Patient Chief Complaint Right LE Ulcers Electronic Signature(s) Signed: 12/14/2021 10:00:17 AM By: Worthy Keeler PA-C Entered By: Worthy Keeler on 12/14/2021 10:00:16 Bors, [name] ([id number]) -------------------------------------------------------------------------------- Debridement Details Patient Name: [name]. Date of Service: 12/14/2021 9:15 AM Medical Record Number: [id number] Patient Account Number: [id number] Date of Birth/Sex: Mar 17, 1951 (71 y.o. F) Treating RN: Levora Dredge Primary Care Provider: [name] Other Clinician: Referring Provider: [name] Treating Provider/Extender: Skipper Cliche in Treatment: 2 Debridement Performed for Wound #2 Right,Lateral Lower Leg Assessment: Performed By: Physician Tommie Sams., PA-C Debridement Type: Debridement Severity of Tissue Pre Debridement: Fat layer exposed Level of Consciousness (Pre- Awake and Alert procedure): Pre-procedure Verification/Time Out Yes - 09:45 Taken: Pain Control: Lidocaine Injectable : 20 Total Area Debrided (L x W): 6.8 (cm) x 1.4 (cm) = 9.52 (cm) Tissue and other material Viable, Non-Viable, Slough, Subcutaneous, Skin: Dermis , Skin: Epidermis, Slough debrided: Level: Skin/Subcutaneous Tissue Debridement Description: Excisional Instrument: Curette, Scissors Bleeding: Moderate Hemostasis Achieved: Silver Nitrate Response to Treatment: Procedure was tolerated well Level of Consciousness (Post- Awake  and Alert procedure): Post Debridement Measurements of Total Wound Length: (cm) 6.8 Width: (cm) 1.4 Depth: (cm) 0.3 Volume: (cm) 2.243 Character of Wound/Ulcer Post Debridement: Stable Severity of Tissue Post Debridement: Fat layer exposed Post Procedure Diagnosis Same as Pre-procedure Notes 1 stuck silver nitrate used to stop bleeding Electronic Signature(s) Signed: 12/14/2021 4:07:49 PM By: Levora Dredge Signed: 12/16/2021 4:35:29 PM By: Worthy Keeler PA-C Entered By: Levora Dredge on 12/14/2021 09:58:08 [name], [name] ([id number]) -------------------------------------------------------------------------------- HPI Details Patient Name: [name]. Date of Service: 12/14/2021 9:15 AM Medical Record Number: [id number] Patient Account Number: [id number] Date of Birth/Sex: 29-Oct-1950 (71 y.o. F) Treating RN: Levora Dredge Primary Care Provider: [name] Other Clinician: Referring Provider: [name] Treating Provider/Extender: Skipper Cliche in Treatment: 2 History of Present Illness HPI Description: 11-30-2021 upon evaluation today patient presents for initial inspection here in our clinic concerning wounds that she has over the right lateral leg as well as the right medial leg. Subsequently the patient is dealing with lower extremity edema which I do believe has been one of the big causes of what is going on here. She also does have what appears to be cellulitis of the leg as well as peripheral vascular disease. She already has amputation of a toe on the right foot. With that being said I did review that surgical pathology from the amputation this was the right third toe which showed a gangrenous ulcer with underlying cellulitis negative for acute osteomyelitis but acute cellulitis was present in the proximal margin. There was also noted on this pathology report a biopsy from the right leg which showed a gangrenous ulcer with underlying cellulitis and foreign  body type giant cell reaction. That was performed on 11-11-2021 and resulted on 11-15-2021 She is also had lower extremity arterial duplex studies which did show that she had falsely elevated ABI on the right with a TBI of 0.45. On the left she had an ABI of 1.05 with a TBI of 0.12. She is pretty much monophasic throughout. With that being  said there was definite reason for concern with regard to her blood flow into the legs she does have a follow-up with the vascular surgeon coming up shortly as well. 12-14-2021 upon evaluation today patient appears to be doing decently well currently in regard to her wound. We are definitely seeing some signs of improvement there is definitely some necrotic tissue as well but try to clean some this away carefully today. Fortunately I think that she is going to tolerate the debridement without complication she otherwise seems to be healing excellent. I think get a lot of the dead tissue clearway will make a big difference as far as her healing is concerned. Electronic Signature(s) Signed: 12/14/2021 10:38:48 AM By: Worthy Keeler PA-C Entered By: Worthy Keeler on 12/14/2021 10:38:47 [name], [name] ([id number]) -------------------------------------------------------------------------------- Physical Exam Details Patient Name: [name], [name]. Date of Service: 12/14/2021 9:15 AM Medical Record Number: [id number] Patient Account Number: [id number] Date of Birth/Sex: [birth year] (71 y.o. F) Treating RN: Levora Dredge Primary Care Provider: [name] Other Clinician: Referring Provider: [name] Treating Provider/Extender: Skipper Cliche in Treatment: 2 Constitutional Well-nourished and well-hydrated in no acute distress. Respiratory normal breathing without difficulty. Psychiatric this patient is able to make decisions and demonstrates good insight into disease process. Alert and Oriented x 3. pleasant and cooperative. Notes Upon inspection  patient's wound bed actually showed signs of good granulation and epithelization at this point. Fortunately I do not see any evidence of infection locally or systemically which is great news and overall I did perform sharp debridement clearway some of the necrotic debris she also had a skin bridge which had nothing underneath it was not can to survive and so we therefore did numb her with 2% lidocaine and then performed debridement to clear this away today. I was able to get the bridge gauze and that will actually allow this to heal much more effectively and quickly. Electronic Signature(s) Signed: 12/14/2021 10:39:22 AM By: Worthy Keeler PA-C Entered By: Worthy Keeler on 12/14/2021 10:39:22 [name], [name] ([id number]) -------------------------------------------------------------------------------- Physician Orders Details Patient Name: [name]. Date of Service: 12/14/2021 9:15 AM Medical Record Number: [id number] Patient Account Number: [id number] Date of Birth/Sex: Oct 22, 1950 (71 y.o. F) Treating RN: Levora Dredge Primary Care Provider: [name] Other Clinician: Referring Provider: [name] Treating Provider/Extender: Skipper Cliche in Treatment: 2 Verbal / Phone Orders: No Diagnosis Coding Follow-up Appointments o Return Appointment in 1 week. Bathing/ Shower/ Hygiene o May shower; gently cleanse wound with antibacterial soap, rinse and pat dry prior to dressing wounds - Hibiclens recommended for washing right lower leg prior to dressing changes. You may pick this up at a pharmacy or purchase on [place] o No tub bath. Edema Control - Lymphedema / Segmental Compressive Device / Other o Tubigrip double layer applied - size D o Elevate, Exercise Daily and Avoid Standing for Long Periods of Time. o Elevate legs to the level of the heart and pump ankles as often as possible o Elevate leg(s) parallel to the floor when sitting. Wound Treatment Wound #1  - Lower Leg Wound Laterality: Right, Medial Cleanser: Hibiclens, 16 (oz) 3 x Per Week/30 Days Discharge Instructions: Put Hibiclenso on your skin and rub it in gently for five minutes with a washcloth. Turn the water back on and rinse very well with warm water. Do not use your regular soap after using and rinsing Hibiclenso. Pat yourself dry with a clean towel. Primary Dressing: Silvercel 4 1/4x 4 1/4 (in/in)  3 x Per Week/30 Days Discharge Instructions: Apply Silvercel 4 1/4x 4 1/4 (in/in) as instructed Secondary Dressing: ABD Pad 5x9 (in/in) 3 x Per Week/30 Days Discharge Instructions: Cover with ABD pad Secured With: Medipore Tape - 14M Medipore H Soft Cloth Surgical Tape, 2x2 (in/yd) 3 x Per Week/30 Days Secured With: Conform 4'' - Conforming Stretch Gauze Bandage 4x75 (in/in) 3 x Per Week/30 Days Discharge Instructions: Apply as directed Secured With: Tubigrip Size D, 3x10 (in/yd) 3 x Per Week/30 Days Discharge Instructions: double layer Wound #2 - Lower Leg Wound Laterality: Right, Lateral Cleanser: Hibiclens, 16 (oz) 3 x Per Week/30 Days Discharge Instructions: Put Hibiclenso on your skin and rub it in gently for five minutes with a washcloth. Turn the water back on and rinse very well with warm water. Do not use your regular soap after using and rinsing Hibiclenso. Pat yourself dry with a clean towel. Primary Dressing: Silvercel 4 1/4x 4 1/4 (in/in) 3 x Per Week/30 Days Discharge Instructions: Apply Silvercel 4 1/4x 4 1/4 (in/in) as instructed Secondary Dressing: ABD Pad 5x9 (in/in) 3 x Per Week/30 Days Discharge Instructions: Cover with ABD pad Secured With: Medipore Tape - 14M Medipore H Soft Cloth Surgical Tape, 2x2 (in/yd) 3 x Per Week/30 Days Secured With: Conform 4'' - Conforming Stretch Gauze Bandage 4x75 (in/in) 3 x Per Week/30 Days Discharge Instructions: Apply as directed Secured With: Tubigrip Size D, 3x10 (in/yd) 3 x Per Week/30 Days Discharge Instructions: double  layer [name], [name] ([id number]) Electronic Signature(s) Signed: 12/14/2021 4:07:49 PM By: Levora Dredge Signed: 12/16/2021 4:35:29 PM By: Worthy Keeler PA-C Entered By: Levora Dredge on 12/14/2021 09:57:38 [name], [name] ([id number]) -------------------------------------------------------------------------------- Problem List Details Patient Name: [name]. Date of Service: 12/14/2021 9:15 AM Medical Record Number: [id number] Patient Account Number: [id number] Date of Birth/Sex: 06/30/1950 (71 y.o. F) Treating RN: Levora Dredge Primary Care Provider: [name] Other Clinician: Referring Provider: [name] Treating Provider/Extender: Skipper Cliche in Treatment: 2 Active Problems ICD-10 Encounter Code Description Active Date MDM Diagnosis I87.331 Chronic venous hypertension (idiopathic) with ulcer and inflammation of 11/30/2021 No Yes right lower extremity L97.812 Non-pressure chronic ulcer of other part of right lower leg with fat layer 11/30/2021 No Yes exposed L03.115 Cellulitis of right lower limb 11/30/2021 No Yes I73.89 Other specified peripheral vascular diseases 11/30/2021 No Yes Z89.421 Acquired absence of other right toe(s) 11/30/2021 No Yes Inactive Problems Resolved Problems Electronic Signature(s) Signed: 12/14/2021 10:00:12 AM By: Worthy Keeler PA-C Entered By: Worthy Keeler on 12/14/2021 10:00:12 [name], [name] ([id number]) -------------------------------------------------------------------------------- Progress Note Details Patient Name: [name]. Date of Service: 12/14/2021 9:15 AM Medical Record Number: [id number] Patient Account Number: [id number] Date of Birth/Sex: [birth year] (71 y.o. F) Treating RN: Levora Dredge Primary Care Provider: [name] Other Clinician: Referring Provider: [name] Treating Provider/Extender: Skipper Cliche in Treatment: 2 Subjective Chief Complaint Information obtained from  Patient Right LE Ulcers History of Present Illness (HPI) 11-30-2021 upon evaluation today patient presents for initial inspection here in our clinic concerning wounds that she has over the right lateral leg as well as the right medial leg. Subsequently the patient is dealing with lower extremity edema which I do believe has been one of the big causes of what is going on here. She also does have what appears to be cellulitis of the leg as well as peripheral vascular disease. She already has amputation of a toe on the right foot. With that being said I did review that  surgical pathology from the amputation this was the right third toe which showed a gangrenous ulcer with underlying cellulitis negative for acute osteomyelitis but acute cellulitis was present in the proximal margin. There was also noted on this pathology report a biopsy from the right leg which showed a gangrenous ulcer with underlying cellulitis and foreign body type giant cell reaction. That was performed on 11-11-2021 and resulted on 11-15-2021 She is also had lower extremity arterial duplex studies which did show that she had falsely elevated ABI on the right with a TBI of 0.45. On the left she had an ABI of 1.05 with a TBI of 0.12. She is pretty much monophasic throughout. With that being said there was definite reason for concern with regard to her blood flow into the legs she does have a follow-up with the vascular surgeon coming up shortly as well. 12-14-2021 upon evaluation today patient appears to be doing decently well currently in regard to her wound. We are definitely seeing some signs of improvement there is definitely some necrotic tissue as well but try to clean some this away carefully today. Fortunately I think that she is going to tolerate the debridement without complication she otherwise seems to be healing excellent. I think get a lot of the dead tissue clearway will make a big difference as far as her healing is  concerned. Objective Constitutional Well-nourished and well-hydrated in no acute distress. Vitals Time Taken: 9:25 AM, Height: 63 in, Weight: 147 lbs, BMI: 26, Temperature: 98.2 F, Pulse: 98 bpm, Respiratory Rate: 18 breaths/min, Blood Pressure: 144/78 mmHg. Respiratory normal breathing without difficulty. Psychiatric this patient is able to make decisions and demonstrates good insight into disease process. Alert and Oriented x 3. pleasant and cooperative. General Notes: Upon inspection patient's wound bed actually showed signs of good granulation and epithelization at this point. Fortunately I do not see any evidence of infection locally or systemically which is great news and overall I did perform sharp debridement clearway some of the necrotic debris she also had a skin bridge which had nothing underneath it was not can to survive and so we therefore did numb her with 2% lidocaine and then performed debridement to clear this away today. I was able to get the bridge gauze and that will actually allow this to heal much more effectively and quickly. Integumentary (Hair, Skin) Wound #1 status is Open. Original cause of wound was Gradually Appeared. The date acquired was: 07/07/2021. The wound has been in treatment 2 weeks. The wound is located on the Right,Medial Lower Leg. The wound measures 1.5cm length x 1cm width x 0.1cm depth; 1.178cm^2 area and 0.118cm^3 volume. There is Fat Layer (Subcutaneous Tissue) exposed. There is no tunneling or undermining noted. There is a medium amount of serosanguineous drainage noted. There is small (1-33%) pink granulation within the wound bed. There is a large (67-100%) amount of necrotic tissue within the wound bed including Adherent Slough. General Notes: several spots have opened up, PA Stone aware Wound #2 status is Open. Original cause of wound was Gradually Appeared. The date acquired was: 07/08/2021. The wound has been in treatment 2 weeks. The wound is  located on the Right,Lateral Lower Leg. The wound measures 6.8cm length x 1.4cm width x 0.3cm depth; 7.477cm^2 area [name], Brandy K. ([id number]) and 2.243cm^3 volume. There is Fat Layer (Subcutaneous Tissue) exposed. There is no tunneling or undermining noted. There is a medium amount of serosanguineous drainage noted. There is small (1-33%) red granulation within the wound  bed. There is a large (67-100%) amount of necrotic tissue within the wound bed including Adherent Slough. Assessment Active Problems ICD-10 Chronic venous hypertension (idiopathic) with ulcer and inflammation of right lower extremity Non-pressure chronic ulcer of other part of right lower leg with fat layer exposed Cellulitis of right lower limb Other specified peripheral vascular diseases Acquired absence of other right toe(s) Procedures Wound #2 Pre-procedure diagnosis of Wound #2 is a Venous Leg Ulcer located on the Right,Lateral Lower Leg .Severity of Tissue Pre Debridement is: Fat layer exposed. There was a Excisional Skin/Subcutaneous Tissue Debridement with a total area of 9.52 sq cm performed by Tommie Sams., PA-C. With the following instrument(s): Curette, and Scissors to remove Viable and Non-Viable tissue/material. Material removed includes Subcutaneous Tissue, Slough, Skin: Dermis, and Skin: Epidermis after achieving pain control using Lidocaine Injectable: 20%. No specimens were taken. A time out was conducted at 09:45, prior to the start of the procedure. A Moderate amount of bleeding was controlled with Silver Nitrate. The procedure was tolerated well. Post Debridement Measurements: 6.8cm length x 1.4cm width x 0.3cm depth; 2.243cm^3 volume. Character of Wound/Ulcer Post Debridement is stable. Severity of Tissue Post Debridement is: Fat layer exposed. Post procedure Diagnosis Wound #2: Same as Pre-Procedure General Notes: 1 stuck silver nitrate used to stop bleeding. Plan Follow-up Appointments: Return  Appointment in 1 week. Bathing/ Shower/ Hygiene: May shower; gently cleanse wound with antibacterial soap, rinse and pat dry prior to dressing wounds - Hibiclens recommended for washing right lower leg prior to dressing changes. You may pick this up at a pharmacy or purchase on [place] No tub bath. Edema Control - Lymphedema / Segmental Compressive Device / Other: Tubigrip double layer applied - size D Elevate, Exercise Daily and Avoid Standing for Long Periods of Time. Elevate legs to the level of the heart and pump ankles as often as possible Elevate leg(s) parallel to the floor when sitting. WOUND #1: - Lower Leg Wound Laterality: Right, Medial Cleanser: Hibiclens, 16 (oz) 3 x Per Week/30 Days Discharge Instructions: Put Hibiclens on your skin and rub it in gently for five minutes with a washcloth. Turn the water back on and rinse very well with warm water. Do not use your regular soap after using and rinsing Hibiclens. Pat yourself dry with a clean towel. Primary Dressing: Silvercel 4 1/4x 4 1/4 (in/in) 3 x Per Week/30 Days Discharge Instructions: Apply Silvercel 4 1/4x 4 1/4 (in/in) as instructed Secondary Dressing: ABD Pad 5x9 (in/in) 3 x Per Week/30 Days Discharge Instructions: Cover with ABD pad Secured With: Medipore Tape - 75M Medipore H Soft Cloth Surgical Tape, 2x2 (in/yd) 3 x Per Week/30 Days Secured With: Conform 4'' - Conforming Stretch Gauze Bandage 4x75 (in/in) 3 x Per Week/30 Days Discharge Instructions: Apply as directed Secured With: Tubigrip Size D, 3x10 (in/yd) 3 x Per Week/30 Days Discharge Instructions: double layer WOUND #2: - Lower Leg Wound Laterality: Right, Lateral Cleanser: Hibiclens, 16 (oz) 3 x Per Week/30 Days Discharge Instructions: Put Hibiclens on your skin and rub it in gently for five minutes with a washcloth. Turn the water back on and rinse very well with warm water. Do not use your regular soap after using and rinsing Hibiclens. Pat yourself  dry with a clean towel. Primary Dressing: Silvercel 4 1/4x 4 1/4 (in/in) 3 x Per Week/30 Days Discharge Instructions: Apply Silvercel 4 1/4x 4 1/4 (in/in) as instructed Secondary Dressing: ABD Pad 5x9 (in/in) 3 x Per Week/30 Days Discharge Instructions: Cover with ABD  pad Secured With: Medipore Tape - 79M Medipore H Soft Cloth Surgical Tape, 2x2 (in/yd) 3 x Per Week/30 Days [name], [name] ([id number]) Secured With: Conform 4'' - Conforming Stretch Gauze Bandage 4x75 (in/in) 3 x Per Week/30 Days Discharge Instructions: Apply as directed Secured With: Tubigrip Size D, 3x10 (in/yd) 3 x Per Week/30 Days Discharge Instructions: double layer 1. I am going to suggest that we go ahead and continue with the recommendation for wound care measures as before and the patient is in agreement with plan. This includes the use of the silver alginate dressing which I think is doing decently well. 2. We will continue with the ABD pad to cover. 3. We will use a roll gauze to secure in place followed by tape and then Tubigrip size D to cover. This is an double layer. We will see patient back for reevaluation in 1 week here in the clinic. If anything worsens or changes patient will contact our office for additional recommendations. Electronic Signature(s) Signed: 12/14/2021 10:39:49 AM By: Worthy Keeler PA-C Entered By: Worthy Keeler on 12/14/2021 10:39:49 [name], [name] ([id number]) -------------------------------------------------------------------------------- SuperBill Details Patient Name: [name]. Date of Service: 12/14/2021 Medical Record Number: [id number] Patient Account Number: [id number] Date of Birth/Sex: August 21, 1950 (71 y.o. F) Treating RN: Levora Dredge Primary Care Provider: [name] Other Clinician: Referring Provider: [name] Treating Provider/Extender: Skipper Cliche in Treatment: 2 Diagnosis Coding ICD-10 Codes Code Description [phone] Chronic venous  hypertension (idiopathic) with ulcer and inflammation of right lower extremity L97.812 Non-pressure chronic ulcer of other part of right lower leg with fat layer exposed L03.115 Cellulitis of right lower limb I73.89 Other specified peripheral vascular diseases Z89.421 Acquired absence of other right toe(s) Facility Procedures CPT4 Code: [id number] Description: 24268 - DEB SUBQ TISSUE 20 SQ CM/< Modifier: Quantity: 1 CPT4 Code: Description: ICD-10 Diagnosis Description T41.962 Non-pressure chronic ulcer of other part of right lower leg with fat lay Modifier: er exposed Quantity: Physician Procedures CPT4 Code: [id number] Description: 11042 - WC PHYS SUBQ TISS 20 SQ CM Modifier: Quantity: 1 CPT4 Code: Description: ICD-10 Diagnosis Description Q11.941 Non-pressure chronic ulcer of other part of right lower leg with fat lay Modifier: er exposed Quantity: Electronic Signature(s) Signed: 12/14/2021 10:40:06 AM By: Worthy Keeler PA-C Entered By: Worthy Keeler on 12/14/2021 10:40:06

## 2021-12-18 NOTE — Progress Notes (Signed)
  Subjective:  Patient ID: [name], female    DOB: [birth year],  MRN: [id number]  Chief Complaint  Patient presents with   Routine Post Op    "Pretty good, I still get those bumps, they blister, then they pop."    Procedure: Amputation of right third toe, debridement of wounds with application of ACell  71 y.o. female returns for post-op check.  Overall doing well says the toe amputation site is doing okay  Review of Systems: Negative except as noted in the HPI. Denies N/V/F/Ch.   Objective:  There were no vitals filed for this visit. There is no height or weight on file to calculate BMI. Constitutional Well developed. Well nourished.  Vascular Foot warm and well perfused. Capillary refill normal to all digits.  Calf is soft and supple, no posterior calf or knee pain, negative Homans' sign  Neurologic Normal speech. Oriented to person, place, and time. Epicritic sensation to light touch grossly reduced bilaterally.  Dermatologic Amputation site is well-healed.  Orthopedic: Tenderness to palpation noted about the surgical site.   Pathology results with staph epidermidis sensitive to tetracycline  Wound biopsy, toe amputation  FINAL MICROSCOPIC DIAGNOSIS:   A. RIGHT LEG WOUND, EXCISION:  Gangrenous ulcer with underlying gangrenous cellulitis with scattered  foreign body type giant cells   B. RIGHT THIRD TOE, AMPUTATION:  Gangrenous ulcer with underlying gangrenous cellulitis  Negative for acute osteomyelitis  Acute cellulitis present in proximal margin  Arteriolosclerosis    Assessment:   No diagnosis found.  Plan:  Patient was evaluated and treated and all questions answered.  S/p foot surgery right -Incision is well-healed.  She is following at the wound care center for her pyoderma gangrenosum.  She return as needed at this requires further surgical intervention with me.  Return if symptoms worsen or fail to improve.

## 2021-12-20 DIAGNOSIS — M6281 Muscle weakness (generalized): Secondary | ICD-10-CM | POA: Diagnosis not present

## 2021-12-20 DIAGNOSIS — M25674 Stiffness of right foot, not elsewhere classified: Secondary | ICD-10-CM | POA: Diagnosis not present

## 2021-12-20 DIAGNOSIS — R2689 Other abnormalities of gait and mobility: Secondary | ICD-10-CM | POA: Diagnosis not present

## 2021-12-20 DIAGNOSIS — M79671 Pain in right foot: Secondary | ICD-10-CM | POA: Diagnosis not present

## 2021-12-21 ENCOUNTER — Encounter: Payer: Medicare HMO | Admitting: Physician Assistant

## 2021-12-21 DIAGNOSIS — I7389 Other specified peripheral vascular diseases: Secondary | ICD-10-CM | POA: Diagnosis not present

## 2021-12-21 DIAGNOSIS — I87331 Chronic venous hypertension (idiopathic) with ulcer and inflammation of right lower extremity: Secondary | ICD-10-CM | POA: Diagnosis not present

## 2021-12-21 DIAGNOSIS — L03115 Cellulitis of right lower limb: Secondary | ICD-10-CM | POA: Diagnosis not present

## 2021-12-21 DIAGNOSIS — I872 Venous insufficiency (chronic) (peripheral): Secondary | ICD-10-CM | POA: Diagnosis not present

## 2021-12-21 DIAGNOSIS — Z89421 Acquired absence of other right toe(s): Secondary | ICD-10-CM | POA: Diagnosis not present

## 2021-12-21 DIAGNOSIS — L97812 Non-pressure chronic ulcer of other part of right lower leg with fat layer exposed: Secondary | ICD-10-CM | POA: Diagnosis not present

## 2021-12-21 NOTE — Progress Notes (Addendum)
ESTELENE, CARMACK ([id number]) Visit Report for 12/21/2021 Chief Complaint Document Details Patient Name: [name], [name]. Date of Service: 12/21/2021 9:15 AM Medical Record Number: [id number] Patient Account Number: [id number] Date of Birth/Sex: 03-12-1951 (71 y.o. F) Treating RN: Levora Dredge Primary Care Provider: [name] Other Clinician: Referring Provider: [name] Treating Provider/Extender: Skipper Cliche in Treatment: 3 Information Obtained from: Patient Chief Complaint Right LE Ulcers Electronic Signature(s) Signed: 12/21/2021 9:48:11 AM By: Worthy Keeler PA-C Entered By: Worthy Keeler on 12/21/2021 09:48:10 [name], [name] ([id number]) -------------------------------------------------------------------------------- Debridement Details Patient Name: [name]. Date of Service: 12/21/2021 9:15 AM Medical Record Number: [id number] Patient Account Number: [id number] Date of Birth/Sex: 1950/07/06 (71 y.o. F) Treating RN: Levora Dredge Primary Care Provider: [name] Other Clinician: Referring Provider: [name] Treating Provider/Extender: Skipper Cliche in Treatment: 3 Debridement Performed for Wound #2 Right,Lateral Lower Leg Assessment: Performed By: Physician Tommie Sams., PA-C Debridement Type: Debridement Severity of Tissue Pre Debridement: Fat layer exposed Level of Consciousness (Pre- Awake and Alert procedure): Pre-procedure Verification/Time Out Yes - 09:52 Taken: Total Area Debrided (L x W): 6.4 (cm) x 1.2 (cm) = 7.68 (cm) Tissue and other material Viable, Non-Viable, Slough, Subcutaneous, Biofilm, Slough debrided: Level: Skin/Subcutaneous Tissue Debridement Description: Excisional Instrument: Curette Bleeding: Moderate Hemostasis Achieved: Pressure Response to Treatment: Procedure was tolerated well Level of Consciousness (Post- Awake and Alert procedure): Post Debridement Measurements of Total Wound Length: (cm)  6.4 Width: (cm) 1.2 Depth: (cm) 0.4 Volume: (cm) 2.413 Character of Wound/Ulcer Post Debridement: Stable Severity of Tissue Post Debridement: Fat layer exposed Post Procedure Diagnosis Same as Pre-procedure Electronic Signature(s) Signed: 12/21/2021 4:07:43 PM By: Levora Dredge Signed: 12/21/2021 5:53:34 PM By: Worthy Keeler PA-C Entered By: Levora Dredge on 12/21/2021 09:55:13 [name], [name] ([id number]) -------------------------------------------------------------------------------- HPI Details Patient Name: [name]. Date of Service: 12/21/2021 9:15 AM Medical Record Number: [id number] Patient Account Number: [id number] Date of Birth/Sex: 1950/07/23 (71 y.o. F) Treating RN: Levora Dredge Primary Care Provider: [name] Other Clinician: Referring Provider: [name] Treating Provider/Extender: Skipper Cliche in Treatment: 3 History of Present Illness HPI Description: 11-30-2021 upon evaluation today patient presents for initial inspection here in our clinic concerning wounds that she has over the right lateral leg as well as the right medial leg. Subsequently the patient is dealing with lower extremity edema which I do believe has been one of the big causes of what is going on here. She also does have what appears to be cellulitis of the leg as well as peripheral vascular disease. She already has amputation of a toe on the right foot. With that being said I did review that surgical pathology from the amputation this was the right third toe which showed a gangrenous ulcer with underlying cellulitis negative for acute osteomyelitis but acute cellulitis was present in the proximal margin. There was also noted on this pathology report a biopsy from the right leg which showed a gangrenous ulcer with underlying cellulitis and foreign body type giant cell reaction. That was performed on 11-11-2021 and resulted on 11-15-2021 She is also had lower extremity arterial duplex  studies which did show that she had falsely elevated ABI on the right with a TBI of 0.45. On the left she had an ABI of 1.05 with a TBI of 0.12. She is pretty much monophasic throughout. With that being said there was definite reason for concern with regard to her blood flow into the legs she does have a follow-up  with the vascular surgeon coming up shortly as well. 12-14-2021 upon evaluation today patient appears to be doing decently well currently in regard to her wound. We are definitely seeing some signs of improvement there is definitely some necrotic tissue as well but try to clean some this away carefully today. Fortunately I think that she is going to tolerate the debridement without complication she otherwise seems to be healing excellent. I think get a lot of the dead tissue clearway will make a big difference as far as her healing is concerned. 12-21-2021 upon evaluation today patient appears to be doing well currently in regard to her legs she has been using the Hibiclens which she tells me seems to be helping. She tells me the leg feels much better washing with this compared to what it was previous which is great news. Fortunately I do not see any signs of active infection and I am overall extremely pleased with where we stand. I do think we are on the right track here. Electronic Signature(s) Signed: 12/21/2021 5:31:03 PM By: Worthy Keeler PA-C Entered By: Worthy Keeler on 12/21/2021 17:31:03 [name], [name] ([id number]) -------------------------------------------------------------------------------- Physical Exam Details Patient Name: [name] Date of Service: 12/21/2021 9:15 AM Medical Record Number: [id number] Patient Account Number: [id number] Date of Birth/Sex: [birth year] (71 y.o. F) Treating RN: Levora Dredge Primary Care Provider: [name] Other Clinician: Referring Provider: [name] Treating Provider/Extender: Skipper Cliche in Treatment:  3 Constitutional Well-nourished and well-hydrated in no acute distress. Respiratory normal breathing without difficulty. Psychiatric this patient is able to make decisions and demonstrates good insight into disease process. Alert and Oriented x 3. pleasant and cooperative. Notes Upon inspection patient's wound bed actually showed signs of good granulation and epithelization at this point. Fortunately I do not see any evidence of active infection locally or systemically which is great news and overall I am extremely pleased with where we stand. Electronic Signature(s) Signed: 12/21/2021 5:31:18 PM By: Worthy Keeler PA-C Entered By: Worthy Keeler on 12/21/2021 17:31:18 [name], [name] ([id number]) -------------------------------------------------------------------------------- Physician Orders Details Patient Name: [name]. Date of Service: 12/21/2021 9:15 AM Medical Record Number: [id number] Patient Account Number: [id number] Date of Birth/Sex: Mar 04, 1951 (71 y.o. F) Treating RN: Levora Dredge Primary Care Provider: [name] Other Clinician: Referring Provider: [name] Treating Provider/Extender: Skipper Cliche in Treatment: 3 Verbal / Phone Orders: No Diagnosis Coding ICD-10 Coding Code Description I87.331 Chronic venous hypertension (idiopathic) with ulcer and inflammation of right lower extremity L97.812 Non-pressure chronic ulcer of other part of right lower leg with fat layer exposed L03.115 Cellulitis of right lower limb I73.89 Other specified peripheral vascular diseases 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 Bathing/ Shower/ Hygiene o May shower; gently cleanse wound with antibacterial soap, rinse and pat dry prior to dressing wounds - Hibiclens recommended for washing right lower leg prior to dressing changes. You may pick this up at a pharmacy or purchase on [place] o No tub  bath. Edema Control - Lymphedema / Segmental Compressive Device / Other o Tubigrip single layer applied. - size D o Elevate, Exercise Daily and Avoid Standing for Long Periods of Time. o Elevate legs to the level of the heart and pump ankles as often as possible o Elevate leg(s) parallel to the floor when sitting. Wound Treatment Wound #1 - Lower Leg Wound Laterality: Right, Medial Cleanser: Hibiclens, 16 (oz) 3 x Per Week/30 Days Discharge Instructions: Put  Hibiclenso on your skin and rub it in gently for five minutes with a washcloth. Turn the water back on and rinse very well with warm water. Do not use your regular soap after using and rinsing Hibiclenso. Pat yourself dry with a clean towel. Primary Dressing: Silvercel 4 1/4x 4 1/4 (in/in) 3 x Per Week/30 Days Discharge Instructions: Apply Silvercel 4 1/4x 4 1/4 (in/in) as instructed Secondary Dressing: ABD Pad 5x9 (in/in) 3 x Per Week/30 Days Discharge Instructions: Cover with ABD pad Secured With: Medipore Tape - 56M Medipore H Soft Cloth Surgical Tape, 2x2 (in/yd) 3 x Per Week/30 Days Secured With: Conform 4'' - Conforming Stretch Gauze Bandage 4x75 (in/in) 3 x Per Week/30 Days Discharge Instructions: Apply as directed Secured With: Tubigrip Size D, 3x10 (in/yd) 3 x Per Week/30 Days Wound #2 - Lower Leg Wound Laterality: Right, Lateral Cleanser: Hibiclens, 16 (oz) 3 x Per Week/30 Days Discharge Instructions: Put Hibiclenso on your skin and rub it in gently for five minutes with a washcloth. Turn the water back on and rinse very well with warm water. Do not use your regular soap after using and rinsing Hibiclenso. Pat yourself dry with a clean towel. Primary Dressing: Silvercel 4 1/4x 4 1/4 (in/in) 3 x Per Week/30 Days Discharge Instructions: Apply Silvercel 4 1/4x 4 1/4 (in/in) as instructed Secondary Dressing: ABD Pad 5x9 (in/in) 3 x Per Week/30 Days Discharge Instructions: Cover with ABD pad Secured With: Medipore Tape - 56M  Medipore H Soft Cloth Surgical Tape, 2x2 (in/yd) 3 x Per Week/30 Days [name], [name] ([id number]) Secured With: Conform 4'' - Conforming Stretch Gauze Bandage 4x75 (in/in) 3 x Per Week/30 Days Discharge Instructions: Apply as directed Secured With: Tubigrip Size D, 3x10 (in/yd) 3 x Per Week/30 Days Electronic Signature(s) Signed: 12/21/2021 4:07:43 PM By: Levora Dredge Signed: 12/21/2021 5:53:34 PM By: Worthy Keeler PA-C Entered By: Levora Dredge on 12/21/2021 12:37:54 [name], [name] ([id number]) -------------------------------------------------------------------------------- Problem List Details Patient Name: [name]. Date of Service: 12/21/2021 9:15 AM Medical Record Number: [id number] Patient Account Number: [id number] Date of Birth/Sex: Oct 24, 1950 (71 y.o. F) Treating RN: Levora Dredge Primary Care Provider: [name] Other Clinician: Referring Provider: [name] Treating Provider/Extender: Skipper Cliche in Treatment: 3 Active Problems ICD-10 Encounter Code Description Active Date MDM Diagnosis I87.331 Chronic venous hypertension (idiopathic) with ulcer and inflammation of 11/30/2021 No Yes right lower extremity L97.812 Non-pressure chronic ulcer of other part of right lower leg with fat layer 11/30/2021 No Yes exposed L03.115 Cellulitis of right lower limb 11/30/2021 No Yes I73.89 Other specified peripheral vascular diseases 11/30/2021 No Yes Z89.421 Acquired absence of other right toe(s) 11/30/2021 No Yes Inactive Problems Resolved Problems Electronic Signature(s) Signed: 12/21/2021 9:47:53 AM By: Worthy Keeler PA-C Entered By: Worthy Keeler on 12/21/2021 09:47:53 [name], [name] ([id number]) -------------------------------------------------------------------------------- Progress Note Details Patient Name: [name]. Date of Service: 12/21/2021 9:15 AM Medical Record Number: [id number] Patient Account Number: [id number] Date of  Birth/Sex: [birth year] (71 y.o. F) Treating RN: Levora Dredge Primary Care Provider: [name] Other Clinician: Referring Provider: [name] Treating Provider/Extender: Skipper Cliche in Treatment: 3 Subjective Chief Complaint Information obtained from Patient Right LE Ulcers History of Present Illness (HPI) 11-30-2021 upon evaluation today patient presents for initial inspection here in our clinic concerning wounds that she has over the right lateral leg as well as the right medial leg. Subsequently the patient is dealing with lower extremity edema which I do believe has been one of the  big causes of what is going on here. She also does have what appears to be cellulitis of the leg as well as peripheral vascular disease. She already has amputation of a toe on the right foot. With that being said I did review that surgical pathology from the amputation this was the right third toe which showed a gangrenous ulcer with underlying cellulitis negative for acute osteomyelitis but acute cellulitis was present in the proximal margin. There was also noted on this pathology report a biopsy from the right leg which showed a gangrenous ulcer with underlying cellulitis and foreign body type giant cell reaction. That was performed on 11-11-2021 and resulted on 11-15-2021 She is also had lower extremity arterial duplex studies which did show that she had falsely elevated ABI on the right with a TBI of 0.45. On the left she had an ABI of 1.05 with a TBI of 0.12. She is pretty much monophasic throughout. With that being said there was definite reason for concern with regard to her blood flow into the legs she does have a follow-up with the vascular surgeon coming up shortly as well. 12-14-2021 upon evaluation today patient appears to be doing decently well currently in regard to her wound. We are definitely seeing some signs of improvement there is definitely some necrotic tissue as well but try to clean some  this away carefully today. Fortunately I think that she is going to tolerate the debridement without complication she otherwise seems to be healing excellent. I think get a lot of the dead tissue clearway will make a big difference as far as her healing is concerned. 12-21-2021 upon evaluation today patient appears to be doing well currently in regard to her legs she has been using the Hibiclens which she tells me seems to be helping. She tells me the leg feels much better washing with this compared to what it was previous which is great news. Fortunately I do not see any signs of active infection and I am overall extremely pleased with where we stand. I do think we are on the right track here. Objective Constitutional Well-nourished and well-hydrated in no acute distress. Vitals Time Taken: 9:31 AM, Height: 63 in, Weight: 147 lbs, BMI: 26, Temperature: 98.2 F, Pulse: 101 bpm, Respiratory Rate: 18 breaths/min, Blood Pressure: 123/63 mmHg. Respiratory normal breathing without difficulty. Psychiatric this patient is able to make decisions and demonstrates good insight into disease process. Alert and Oriented x 3. pleasant and cooperative. General Notes: Upon inspection patient's wound bed actually showed signs of good granulation and epithelization at this point. Fortunately I do not see any evidence of active infection locally or systemically which is great news and overall I am extremely pleased with where we stand. Integumentary (Hair, Skin) Wound #1 status is Open. Original cause of wound was Gradually Appeared. The date acquired was: 07/07/2021. The wound has been in treatment 3 weeks. The wound is located on the Right,Medial Lower Leg. The wound measures 2.4cm length x 1cm width x 0.1cm depth; 1.885cm^2 area and 0.188cm^3 volume. There is Fat Layer (Subcutaneous Tissue) exposed. There is no tunneling or undermining noted. There is a medium amount of serosanguineous drainage noted. There is  medium (34-66%) pink granulation within the wound bed. There is a medium (34-66%) amount of necrotic tissue within the wound bed including Adherent Slough. Wound #2 status is Open. Original cause of wound was Gradually Appeared. The date acquired was: 07/08/2021. The wound has been in treatment [name], [name]. ([id number]) 3  weeks. The wound is located on the Right,Lateral Lower Leg. The wound measures 6.4cm length x 1.2cm width x 0.4cm depth; 6.032cm^2 area and 2.413cm^3 volume. There is Fat Layer (Subcutaneous Tissue) exposed. There is no tunneling or undermining noted. There is a medium amount of serosanguineous drainage noted. There is small (1-33%) red granulation within the wound bed. There is a large (67-100%) amount of necrotic tissue within the wound bed including Adherent Slough. Assessment Active Problems ICD-10 Chronic venous hypertension (idiopathic) with ulcer and inflammation of right lower extremity Non-pressure chronic ulcer of other part of right lower leg with fat layer exposed Cellulitis of right lower limb Other specified peripheral vascular diseases Acquired absence of other right toe(s) Procedures Wound #2 Pre-procedure diagnosis of Wound #2 is a Venous Leg Ulcer located on the Right,Lateral Lower Leg .Severity of Tissue Pre Debridement is: Fat layer exposed. There was a Excisional Skin/Subcutaneous Tissue Debridement with a total area of 7.68 sq cm performed by Tommie Sams., PA-C. With the following instrument(s): Curette to remove Viable and Non-Viable tissue/material. Material removed includes Subcutaneous Tissue, Slough, and Biofilm. No specimens were taken. A time out was conducted at 09:52, prior to the start of the procedure. A Moderate amount of bleeding was controlled with Pressure. The procedure was tolerated well. Post Debridement Measurements: 6.4cm length x 1.2cm width x 0.4cm depth; 2.413cm^3 volume. Character of Wound/Ulcer Post Debridement is stable.  Severity of Tissue Post Debridement is: Fat layer exposed. Post procedure Diagnosis Wound #2: Same as Pre-Procedure Plan Follow-up Appointments: Return Appointment in 1 week. Bathing/ Shower/ Hygiene: May shower; gently cleanse wound with antibacterial soap, rinse and pat dry prior to dressing wounds - Hibiclens recommended for washing right lower leg prior to dressing changes. You may pick this up at a pharmacy or purchase on [place] No tub bath. Edema Control - Lymphedema / Segmental Compressive Device / Other: Tubigrip single layer applied. - size D Elevate, Exercise Daily and Avoid Standing for Long Periods of Time. Elevate legs to the level of the heart and pump ankles as often as possible Elevate leg(s) parallel to the floor when sitting. WOUND #1: - Lower Leg Wound Laterality: Right, Medial Cleanser: Hibiclens, 16 (oz) 3 x Per Week/30 Days Discharge Instructions: Put Hibiclens on your skin and rub it in gently for five minutes with a washcloth. Turn the water back on and rinse very well with warm water. Do not use your regular soap after using and rinsing Hibiclens. Pat yourself dry with a clean towel. Primary Dressing: Silvercel 4 1/4x 4 1/4 (in/in) 3 x Per Week/30 Days Discharge Instructions: Apply Silvercel 4 1/4x 4 1/4 (in/in) as instructed Secondary Dressing: ABD Pad 5x9 (in/in) 3 x Per Week/30 Days Discharge Instructions: Cover with ABD pad Secured With: Medipore Tape - 20M Medipore H Soft Cloth Surgical Tape, 2x2 (in/yd) 3 x Per Week/30 Days Secured With: Conform 4'' - Conforming Stretch Gauze Bandage 4x75 (in/in) 3 x Per Week/30 Days Discharge Instructions: Apply as directed Secured With: Tubigrip Size D, 3x10 (in/yd) 3 x Per Week/30 Days WOUND #2: - Lower Leg Wound Laterality: Right, Lateral Cleanser: Hibiclens, 16 (oz) 3 x Per Week/30 Days Discharge Instructions: Put Hibiclens on your skin and rub it in gently for five minutes with a washcloth. Turn the water back  on and rinse very well with warm water. Do not use your regular soap after using and rinsing Hibiclens. Pat yourself dry with a clean towel. Primary Dressing: Silvercel 4 1/4x 4 1/4 (in/in) 3 x  Per Week/30 Days Discharge Instructions: Apply Silvercel 4 1/4x 4 1/4 (in/in) as instructed Secondary Dressing: ABD Pad 5x9 (in/in) 3 x Per Week/30 Days Discharge Instructions: Cover with ABD pad Secured With: Medipore Tape - 83M Medipore H Soft Cloth Surgical Tape, 2x2 (in/yd) 3 x Per Week/30 Days [name], [name] ([id number]) Secured With: Conform 4'' - Conforming Stretch Gauze Bandage 4x75 (in/in) 3 x Per Week/30 Days Discharge Instructions: Apply as directed Secured With: Tubigrip Size D, 3x10 (in/yd) 3 x Per Week/30 Days 1. I am going to go ahead and continue with the recommendation for wound care measures as before and this includes the use of the silver alginate dressing followed by an ABD pad. 2. Also can recommend that we have the patient continue to wash with the Hibiclens which I think is doing quite well. 3. I am also going to suggest that she continue with the Tubigrip single-layer size D which I do feel like is helping and I do believe she can take a shower at this point as well taking everything off washing then putting it back on. We will see patient back for reevaluation in 1 week here in the clinic. If anything worsens or changes patient will contact our office for additional recommendations. Electronic Signature(s) Signed: 12/21/2021 5:32:59 PM By: Worthy Keeler PA-C Entered By: Worthy Keeler on 12/21/2021 17:32:59 [name], [name] ([id number]) -------------------------------------------------------------------------------- SuperBill Details Patient Name: [name]. Date of Service: 12/21/2021 Medical Record Number: [id number] Patient Account Number: [id number] Date of Birth/Sex: [birth year] (71 y.o. F) Treating RN: Levora Dredge Primary Care Provider: [name]  Other Clinician: Referring Provider: [name] Treating Provider/Extender: Skipper Cliche in Treatment: 3 Diagnosis Coding ICD-10 Codes Code Description [phone] Chronic venous hypertension (idiopathic) with ulcer and inflammation of right lower extremity L97.812 Non-pressure chronic ulcer of other part of right lower leg with fat layer exposed L03.115 Cellulitis of right lower limb I73.89 Other specified peripheral vascular diseases Z89.421 Acquired absence of other right toe(s) Facility Procedures CPT4 Code: [id number] Description: 73220 - DEB SUBQ TISSUE 20 SQ CM/< Modifier: Quantity: 1 CPT4 Code: Description: ICD-10 Diagnosis Description U54.270 Non-pressure chronic ulcer of other part of right lower leg with fat lay Modifier: er exposed Quantity: Physician Procedures CPT4 Code: [id number] Description: [place] - WC PHYS SUBQ TISS 20 SQ CM Modifier: Quantity: 1 CPT4 Code: Description: ICD-10 Diagnosis Description B15.176 Non-pressure chronic ulcer of other part of right lower leg with fat lay Modifier: er exposed Quantity: Electronic Signature(s) Signed: 12/21/2021 5:33:56 PM By: Worthy Keeler PA-C Entered By: Worthy Keeler on 12/21/2021 17:33:56

## 2021-12-21 NOTE — Progress Notes (Signed)
TALAJAH, SLIMP ([id number]) Visit Report for 12/21/2021 Arrival Information Details Patient Name: [name], [name]. Date of Service: 12/21/2021 9:15 AM Medical Record Number: [id number] Patient Account Number: [id number] Date of Birth/Sex: [birth year] (71 y.o. F) Treating RN: Levora Dredge Primary Care Kattaleya Alia: [name] Other Clinician: Referring Adeleigh Barletta: [name] Treating Princeston Blizzard/Extender: Skipper Cliche in Treatment: 3 Visit Information History Since Last Visit Added or deleted any medications: No Patient Arrived: Walker Any new allergies or adverse reactions: No Arrival Time: 09:28 Had a fall or experienced change in No Accompanied By: self activities of daily living that may affect Transfer Assistance: None risk of falls: Patient Identification Verified: Yes Hospitalized since last visit: No Secondary Verification Process Completed: Yes Pain Present Now: No Patient Requires Transmission-Based No Precautions: Patient Has Alerts: Yes Patient Alerts: Patient on Blood Thinner Electronic Signature(s) Signed: 12/21/2021 4:07:43 PM By: Levora Dredge Entered By: Levora Dredge on 12/21/2021 09:30:59 Ayars, [name] ([id number]) -------------------------------------------------------------------------------- Clinic Level of Care Assessment Details Patient Name: [name]. Date of Service: 12/21/2021 9:15 AM Medical Record Number: [id number] Patient Account Number: [id number] Date of Birth/Sex: February 10, 1951 (71 y.o. F) Treating RN: Levora Dredge Primary Care Jeffory Snelgrove: [name] Other Clinician: Referring Rylie Limburg: [name] Treating Antoinne Spadaccini/Extender: Skipper Cliche in Treatment: 3 Clinic Level of Care Assessment Items TOOL 1 Quantity Score '[]'$  - Use when EandM and Procedure is performed on INITIAL visit 0 ASSESSMENTS - Nursing Assessment / Reassessment '[]'$  - General Physical Exam (combine w/ comprehensive assessment (listed just below) when  performed on new 0 pt. evals) '[]'$  - 0 Comprehensive Assessment (HX, ROS, Risk Assessments, Wounds Hx, etc.) ASSESSMENTS - Wound and Skin Assessment / Reassessment '[]'$  - Dermatologic / Skin Assessment (not related to wound area) 0 ASSESSMENTS - Ostomy and/or Continence Assessment and Care '[]'$  - Incontinence Assessment and Management 0 '[]'$  - 0 Ostomy Care Assessment and Management (repouching, etc.) PROCESS - Coordination of Care '[]'$  - Simple Patient / Family Education for ongoing care 0 '[]'$  - 0 Complex (extensive) Patient / Family Education for ongoing care '[]'$  - 0 Staff obtains Programmer, systems, Records, Test Results / Process Orders '[]'$  - 0 Staff telephones HHA, Nursing Homes / Clarify orders / etc '[]'$  - 0 Routine Transfer to another Facility (non-emergent condition) '[]'$  - 0 Routine Hospital Admission (non-emergent condition) '[]'$  - 0 New Admissions / Biomedical engineer / Ordering NPWT, Apligraf, etc. '[]'$  - 0 Emergency Hospital Admission (emergent condition) PROCESS - Special Needs '[]'$  - Pediatric / Minor Patient Management 0 '[]'$  - 0 Isolation Patient Management '[]'$  - 0 Hearing / Language / Visual special needs '[]'$  - 0 Assessment of Community assistance (transportation, D/C planning, etc.) '[]'$  - 0 Additional assistance / Altered mentation '[]'$  - 0 Support Surface(s) Assessment (bed, cushion, seat, etc.) INTERVENTIONS - Miscellaneous '[]'$  - External ear exam 0 '[]'$  - 0 Patient Transfer (multiple staff / Civil Service fast streamer / Similar devices) '[]'$  - 0 Simple Staple / Suture removal (25 or less) '[]'$  - 0 Complex Staple / Suture removal (26 or more) '[]'$  - 0 Hypo/Hyperglycemic Management (do not check if billed separately) '[]'$  - 0 Ankle / Brachial Index (ABI) - do not check if billed separately Has the patient been seen at the hospital within the last three years: Yes Total Score: 0 Level Of Care: ____ [name] ([id number]) Electronic Signature(s) Signed: 12/21/2021 4:07:43 PM By: Levora Dredge Entered By: Levora Dredge on 12/21/2021 12:36:23 Levert, [name] ([id number]) -------------------------------------------------------------------------------- Encounter Discharge Information Details Patient Name: [name]. Date  of Service: 12/21/2021 9:15 AM Medical Record Number: [id number] Patient Account Number: [id number] Date of Birth/Sex: 22-Nov-1950 (71 y.o. F) Treating RN: Levora Dredge Primary Care Tysha Grismore: [name] Other Clinician: Referring Camilah Spillman: [name] Treating Dilia Alemany/Extender: Skipper Cliche in Treatment: 3 Encounter Discharge Information Items Post Procedure Vitals Discharge Condition: Stable Temperature (F): 98.2 Ambulatory Status: Walker Pulse (bpm): 101 Discharge Destination: Home Respiratory Rate (breaths/min): 18 Transportation: Private Auto Blood Pressure (mmHg): 123/63 Accompanied By: self Schedule Follow-up Appointment: Yes Clinical Summary of Care: Electronic Signature(s) Signed: 12/21/2021 12:37:24 PM By: Levora Dredge Entered By: Levora Dredge on 12/21/2021 12:37:24 Lichter, [name] ([id number]) -------------------------------------------------------------------------------- Lower Extremity Assessment Details Patient Name: [name]. Date of Service: 12/21/2021 9:15 AM Medical Record Number: [id number] Patient Account Number: [id number] Date of Birth/Sex: July 07, 1950 (71 y.o. F) Treating RN: Levora Dredge Primary Care Gracyn Santillanes: [name] Other Clinician: Referring Mitchel Delduca: [name] Treating Taylah Dubiel/Extender: Skipper Cliche in Treatment: 3 Edema Assessment Assessed: [Left: No] [Right: No] Edema: [Left: Ye] [Right: s] Calf Left: Right: Point of Measurement: 32 cm From Medial Instep 31 cm Ankle Left: Right: Point of Measurement: 10 cm From Medial Instep 20.5 cm Vascular Assessment Pulses: Dorsalis Pedis Palpable: [Right:Yes] Electronic Signature(s) Signed: 12/21/2021 4:07:43 PM By:  Levora Dredge Entered By: Levora Dredge on 12/21/2021 09:42:39 Hochstatter, [name] ([id number]) -------------------------------------------------------------------------------- Multi Wound Chart Details Patient Name: [name]. Date of Service: 12/21/2021 9:15 AM Medical Record Number: [id number] Patient Account Number: [id number] Date of Birth/Sex: Mar 16, 1951 (71 y.o. F) Treating RN: Levora Dredge Primary Care Kailan Laws: [name] Other Clinician: Referring Kristia Jupiter: [name] Treating Faustina Gebert/Extender: Skipper Cliche in Treatment: 3 Vital Signs Height(in): 63 Pulse(bpm): 101 Weight(lbs): 147 Blood Pressure(mmHg): 123/63 Body Mass Index(BMI): 26 Temperature(F): 98.2 Respiratory Rate(breaths/min): 18 Photos: [N/A:N/A] Wound Location: Right, Medial Lower Leg Right, Lateral Lower Leg N/A Wounding Event: Gradually Appeared Gradually Appeared N/A Primary Etiology: Venous Leg Ulcer Venous Leg Ulcer N/A Comorbid History: Hypertension, Peripheral Arterial Hypertension, Peripheral Arterial N/A Disease Disease Date Acquired: 07/07/2021 07/08/2021 N/A Weeks of Treatment: 3 3 N/A Wound Status: Open Open N/A Wound Recurrence: No No N/A Measurements L x W x D (cm) 2.4x1x0.1 6.4x1.2x0.4 N/A Area (cm) : 1.885 6.032 N/A Volume (cm) : 0.188 2.413 N/A % Reduction in Area: 33.30% 31.40% N/A % Reduction in Volume: 77.80% 31.40% N/A Classification: Full Thickness Without Exposed Full Thickness Without Exposed N/A Support Structures Support Structures Exudate Amount: Medium Medium N/A Exudate Type: Serosanguineous Serosanguineous N/A Exudate Color: red, brown red, brown N/A Granulation Amount: Medium (34-66%) Small (1-33%) N/A Granulation Quality: Pink Red N/A Necrotic Amount: Medium (34-66%) Large (67-100%) N/A Exposed Structures: Fat Layer (Subcutaneous Tissue): Fat Layer (Subcutaneous Tissue): N/A Yes Yes Fascia: No Fascia: No Tendon: No Tendon: No Muscle: No Muscle:  No Joint: No Joint: No Bone: No Bone: No Epithelialization: None None N/A Treatment Notes Electronic Signature(s) Signed: 12/21/2021 4:07:43 PM By: Levora Dredge Entered By: Levora Dredge on 12/21/2021 09:42:51 Canlas, [name] ([id number]) -------------------------------------------------------------------------------- [place] Details Patient Name: [name]. Date of Service: 12/21/2021 9:15 AM Medical Record Number: [id number] Patient Account Number: [id number] Date of Birth/Sex: 05-28-1951 (71 y.o. F) Treating RN: Levora Dredge Primary Care Eeva Schlosser: [name] Other Clinician: Referring Adwoa Axe: [name] Treating Yvette Loveless/Extender: Skipper Cliche in Treatment: 3 Active Inactive Wound/Skin Impairment Nursing Diagnoses: Knowledge deficit related to ulceration/compromised skin integrity Goals: Patient/caregiver will verbalize understanding of skin care regimen Date Initiated: 11/30/2021 Target Resolution Date: 12/30/2021 Goal Status: Active Ulcer/skin breakdown will have a volume  reduction of 30% by week 4 Date Initiated: 11/30/2021 Target Resolution Date: 01/30/2022 Goal Status: Active Ulcer/skin breakdown will have a volume reduction of 50% by week 8 Date Initiated: 11/30/2021 Target Resolution Date: 03/02/2022 Goal Status: Active Ulcer/skin breakdown will have a volume reduction of 80% by week 12 Date Initiated: 11/30/2021 Target Resolution Date: 04/01/2022 Goal Status: Active Ulcer/skin breakdown will heal within 14 weeks Date Initiated: 11/30/2021 Target Resolution Date: 05/02/2022 Goal Status: Active Interventions: Assess patient/caregiver ability to obtain necessary supplies Assess patient/caregiver ability to perform ulcer/skin care regimen upon admission and as needed Assess ulceration(s) every visit Notes: Electronic Signature(s) Signed: 12/21/2021 4:07:43 PM By: Levora Dredge Entered By: Levora Dredge on 12/21/2021  09:42:43 Polidore, [name] ([id number]) -------------------------------------------------------------------------------- Pain Assessment Details Patient Name: [name]. Date of Service: 12/21/2021 9:15 AM Medical Record Number: [id number] Patient Account Number: [id number] Date of Birth/Sex: 15-Mar-1951 (71 y.o. F) Treating RN: Levora Dredge Primary Care Deepika Decatur: [name] Other Clinician: Referring Jvion Turgeon: [name] Treating Charnese Federici/Extender: Skipper Cliche in Treatment: 3 Active Problems Location of Pain Severity and Description of Pain Patient Has Paino No Site Locations Rate the pain. Current Pain Level: 0 Pain Management and Medication Current Pain Management: Electronic Signature(s) Signed: 12/21/2021 4:07:43 PM By: Levora Dredge Entered By: Levora Dredge on 12/21/2021 09:33:13 Hallums, [name] ([id number]) -------------------------------------------------------------------------------- Patient/Caregiver Education Details Patient Name: [name]. Date of Service: 12/21/2021 9:15 AM Medical Record Number: [id number] Patient Account Number: [id number] Date of Birth/Gender: [birth year] (71 y.o. F) Treating RN: Levora Dredge Primary Care Physician: [name] Other Clinician: Referring Physician: [name] Treating Physician/Extender: Skipper Cliche in Treatment: 3 Education Assessment Education Provided To: Patient Education Topics Provided Wound Debridement: Handouts: Wound Debridement Methods: Explain/Verbal Responses: State content correctly Wound/Skin Impairment: Handouts: Caring for Your Ulcer Methods: Explain/Verbal Responses: State content correctly Electronic Signature(s) Signed: 12/21/2021 4:07:43 PM By: Levora Dredge Entered By: Levora Dredge on 12/21/2021 12:36:42 Likins, [name] ([id number]) -------------------------------------------------------------------------------- Wound Assessment Details Patient Name:  [name]. Date of Service: 12/21/2021 9:15 AM Medical Record Number: [id number] Patient Account Number: [id number] Date of Birth/Sex: [birth year] (71 y.o. F) Treating RN: Levora Dredge Primary Care Arwa Yero: [name] Other Clinician: Referring Perel Hauschild: [name] Treating Misha Antonini/Extender: Skipper Cliche in Treatment: 3 Wound Status Wound Number: 1 Primary Etiology: Venous Leg Ulcer Wound Location: Right, Medial Lower Leg Wound Status: Open Wounding Event: Gradually Appeared Comorbid History: Hypertension, Peripheral Arterial Disease Date Acquired: 07/07/2021 Weeks Of Treatment: 3 Clustered Wound: No Photos Wound Measurements Length: (cm) 2.4 Width: (cm) 1 Depth: (cm) 0.1 Area: (cm) 1.885 Volume: (cm) 0.188 % Reduction in Area: 33.3% % Reduction in Volume: 77.8% Epithelialization: None Tunneling: No Undermining: No Wound Description Classification: Full Thickness Without Exposed Support Structures Exudate Amount: Medium Exudate Type: Serosanguineous Exudate Color: red, brown Foul Odor After Cleansing: No Slough/Fibrino Yes Wound Bed Granulation Amount: Medium (34-66%) Exposed Structure Granulation Quality: Pink Fascia Exposed: No Necrotic Amount: Medium (34-66%) Fat Layer (Subcutaneous Tissue) Exposed: Yes Necrotic Quality: Adherent Slough Tendon Exposed: No Muscle Exposed: No Joint Exposed: No Bone Exposed: No Treatment Notes Wound #1 (Lower Leg) Wound Laterality: Right, Medial Cleanser Hibiclens, 16 (oz) Discharge Instruction: Put Hibiclenso on your skin and rub it in gently for five minutes with a washcloth. Turn the water back on and rinse very well with warm water. Do not use your regular soap after using and rinsing Hibiclenso. Pat yourself dry with a clean towel. Peri-Wound Care TRINIDY, MASTERSON ([id number]) Topical Primary Dressing Silvercel 4 1/4x  4 1/4 (in/in) Discharge Instruction: Apply Silvercel 4 1/4x 4 1/4 (in/in) as  instructed Secondary Dressing ABD Pad 5x9 (in/in) Discharge Instruction: Cover with ABD pad Secured With [place] [facility] Tape, 2x2 (in/yd) Conform 4'' - Conforming Stretch Gauze Bandage 4x75 (in/in) Discharge Instruction: Apply as directed Tubigrip Size D, 3x10 (in/yd) Discharge Instruction: double layer Compression Wrap Compression Stockings Add-Ons Electronic Signature(s) Signed: 12/21/2021 4:07:43 PM By: Levora Dredge Entered By: Levora Dredge on 12/21/2021 09:41:33 Mcclaren, [name] ([id number]) -------------------------------------------------------------------------------- Wound Assessment Details Patient Name: [name]. Date of Service: 12/21/2021 9:15 AM Medical Record Number: [id number] Patient Account Number: [id number] Date of Birth/Sex: 11-16-1950 (71 y.o. F) Treating RN: Levora Dredge Primary Care Mihran Lebarron: [name] Other Clinician: Referring Chaise Passarella: [name] Treating Jennylee Uehara/Extender: Skipper Cliche in Treatment: 3 Wound Status Wound Number: 2 Primary Etiology: Venous Leg Ulcer Wound Location: Right, Lateral Lower Leg Wound Status: Open Wounding Event: Gradually Appeared Comorbid History: Hypertension, Peripheral Arterial Disease Date Acquired: 07/08/2021 Weeks Of Treatment: 3 Clustered Wound: No Photos Wound Measurements Length: (cm) 6.4 Width: (cm) 1.2 Depth: (cm) 0.4 Area: (cm) 6.032 Volume: (cm) 2.413 % Reduction in Area: 31.4% % Reduction in Volume: 31.4% Epithelialization: None Tunneling: No Undermining: No Wound Description Classification: Full Thickness Without Exposed Support Structures Exudate Amount: Medium Exudate Type: Serosanguineous Exudate Color: red, brown Foul Odor After Cleansing: No Slough/Fibrino Yes Wound Bed Granulation Amount: Small (1-33%) Exposed Structure Granulation Quality: Red Fascia Exposed: No Necrotic Amount: Large (67-100%) Fat Layer (Subcutaneous  Tissue) Exposed: Yes Necrotic Quality: Adherent Slough Tendon Exposed: No Muscle Exposed: No Joint Exposed: No Bone Exposed: No Treatment Notes Wound #2 (Lower Leg) Wound Laterality: Right, Lateral Cleanser Hibiclens, 16 (oz) Discharge Instruction: Put Hibiclenso on your skin and rub it in gently for five minutes with a washcloth. Turn the water back on and rinse very well with warm water. Do not use your regular soap after using and rinsing Hibiclenso. Pat yourself dry with a clean towel. Peri-Wound Care ASAKO, SALIBA ([id number]) Topical Primary Dressing Silvercel 4 1/4x 4 1/4 (in/in) Discharge Instruction: Apply Silvercel 4 1/4x 4 1/4 (in/in) as instructed Secondary Dressing ABD Pad 5x9 (in/in) Discharge Instruction: Cover with ABD pad Secured With [place] Surgical Tape, 2x2 (in/yd) Conform 4'' - Conforming Stretch Gauze Bandage 4x75 (in/in) Discharge Instruction: Apply as directed Tubigrip Size D, 3x10 (in/yd) Discharge Instruction: double layer Compression Wrap Compression Stockings Add-Ons Electronic Signature(s) Signed: 12/21/2021 4:07:43 PM By: Levora Dredge Entered By: Levora Dredge on 12/21/2021 09:42:11 Lopata, [name] ([id number]) -------------------------------------------------------------------------------- Vitals Details Patient Name: [name]. Date of Service: 12/21/2021 9:15 AM Medical Record Number: [id number] Patient Account Number: [id number] Date of Birth/Sex: March 12, 1951 (71 y.o. F) Treating RN: Levora Dredge Primary Care Alzada Brazee: [name] Other Clinician: Referring Thaila Bottoms: [name] Treating Lasean Rahming/Extender: Skipper Cliche in Treatment: 3 Vital Signs Time Taken: 09:31 Temperature (F): 98.2 Height (in): 63 Pulse (bpm): 101 Weight (lbs): 147 Respiratory Rate (breaths/min): 18 Body Mass Index (BMI): 26 Blood Pressure (mmHg): 123/63 Reference Range: 80 - 120 mg / dl Electronic  Signature(s) Signed: 12/21/2021 4:07:43 PM By: Levora Dredge Entered By: Levora Dredge on 12/21/2021 09:32:50

## 2021-12-22 DIAGNOSIS — R7303 Prediabetes: Secondary | ICD-10-CM | POA: Diagnosis not present

## 2021-12-22 DIAGNOSIS — C50919 Malignant neoplasm of unspecified site of unspecified female breast: Secondary | ICD-10-CM | POA: Diagnosis not present

## 2021-12-22 DIAGNOSIS — L02611 Cutaneous abscess of right foot: Secondary | ICD-10-CM | POA: Diagnosis not present

## 2021-12-22 DIAGNOSIS — I96 Gangrene, not elsewhere classified: Secondary | ICD-10-CM | POA: Diagnosis not present

## 2021-12-22 DIAGNOSIS — K219 Gastro-esophageal reflux disease without esophagitis: Secondary | ICD-10-CM | POA: Diagnosis not present

## 2021-12-22 DIAGNOSIS — I10 Essential (primary) hypertension: Secondary | ICD-10-CM | POA: Diagnosis not present

## 2021-12-22 DIAGNOSIS — E871 Hypo-osmolality and hyponatremia: Secondary | ICD-10-CM | POA: Diagnosis not present

## 2021-12-22 DIAGNOSIS — M1990 Unspecified osteoarthritis, unspecified site: Secondary | ICD-10-CM | POA: Diagnosis not present

## 2021-12-28 DIAGNOSIS — L02611 Cutaneous abscess of right foot: Secondary | ICD-10-CM | POA: Diagnosis not present

## 2021-12-28 DIAGNOSIS — K219 Gastro-esophageal reflux disease without esophagitis: Secondary | ICD-10-CM | POA: Diagnosis not present

## 2021-12-28 DIAGNOSIS — M1990 Unspecified osteoarthritis, unspecified site: Secondary | ICD-10-CM | POA: Diagnosis not present

## 2021-12-28 DIAGNOSIS — I10 Essential (primary) hypertension: Secondary | ICD-10-CM | POA: Diagnosis not present

## 2021-12-28 DIAGNOSIS — D649 Anemia, unspecified: Secondary | ICD-10-CM | POA: Diagnosis not present

## 2021-12-28 DIAGNOSIS — I96 Gangrene, not elsewhere classified: Secondary | ICD-10-CM | POA: Diagnosis not present

## 2021-12-28 DIAGNOSIS — R7303 Prediabetes: Secondary | ICD-10-CM | POA: Diagnosis not present

## 2021-12-28 DIAGNOSIS — E871 Hypo-osmolality and hyponatremia: Secondary | ICD-10-CM | POA: Diagnosis not present

## 2021-12-31 DIAGNOSIS — M6281 Muscle weakness (generalized): Secondary | ICD-10-CM | POA: Diagnosis not present

## 2021-12-31 DIAGNOSIS — R2689 Other abnormalities of gait and mobility: Secondary | ICD-10-CM | POA: Diagnosis not present

## 2021-12-31 DIAGNOSIS — M79671 Pain in right foot: Secondary | ICD-10-CM | POA: Diagnosis not present

## 2021-12-31 DIAGNOSIS — M25674 Stiffness of right foot, not elsewhere classified: Secondary | ICD-10-CM | POA: Diagnosis not present

## 2022-01-03 DIAGNOSIS — M79671 Pain in right foot: Secondary | ICD-10-CM | POA: Diagnosis not present

## 2022-01-03 DIAGNOSIS — R2689 Other abnormalities of gait and mobility: Secondary | ICD-10-CM | POA: Diagnosis not present

## 2022-01-03 DIAGNOSIS — M25674 Stiffness of right foot, not elsewhere classified: Secondary | ICD-10-CM | POA: Diagnosis not present

## 2022-01-03 DIAGNOSIS — M6281 Muscle weakness (generalized): Secondary | ICD-10-CM | POA: Diagnosis not present

## 2022-01-04 ENCOUNTER — Other Ambulatory Visit
Admission: RE | Admit: 2022-01-04 | Discharge: 2022-01-04 | Disposition: A | Discharge status: Home / Self Care | Payer: Medicare HMO | Source: Ambulatory Visit | Attending: Physician Assistant | Admitting: Physician Assistant

## 2022-01-04 ENCOUNTER — Encounter: Payer: Medicare HMO | Attending: Physician Assistant | Admitting: Physician Assistant

## 2022-01-04 DIAGNOSIS — S51812A Laceration without foreign body of left forearm, initial encounter: Secondary | ICD-10-CM | POA: Insufficient documentation

## 2022-01-04 DIAGNOSIS — L03115 Cellulitis of right lower limb: Secondary | ICD-10-CM | POA: Insufficient documentation

## 2022-01-04 DIAGNOSIS — Z89421 Acquired absence of other right toe(s): Secondary | ICD-10-CM | POA: Insufficient documentation

## 2022-01-04 DIAGNOSIS — B999 Unspecified infectious disease: Secondary | ICD-10-CM | POA: Diagnosis not present

## 2022-01-04 DIAGNOSIS — X58XXXA Exposure to other specified factors, initial encounter: Secondary | ICD-10-CM | POA: Diagnosis not present

## 2022-01-04 DIAGNOSIS — L97812 Non-pressure chronic ulcer of other part of right lower leg with fat layer exposed: Secondary | ICD-10-CM | POA: Diagnosis not present

## 2022-01-04 DIAGNOSIS — L97518 Non-pressure chronic ulcer of other part of right foot with other specified severity: Secondary | ICD-10-CM | POA: Diagnosis not present

## 2022-01-04 DIAGNOSIS — I87331 Chronic venous hypertension (idiopathic) with ulcer and inflammation of right lower extremity: Secondary | ICD-10-CM | POA: Diagnosis not present

## 2022-01-04 NOTE — Progress Notes (Signed)
DAYSIA, VANDENBOOM ([id number]) Visit Report for 01/04/2022 Arrival Information Details Patient Name: Brandy, Mccarthy. Date of Service: 01/04/2022 11:30 AM Medical Record Number: [id number] Patient Account Number: [id number] Date of Birth/Sex: 02-21-1951 (71 y.o. F) Treating RN: Levora Dredge Primary Care Zuzanna Maroney: [name] Other Clinician: Referring Burman Bruington: [name] Treating Iola Turri/Extender: Skipper Cliche in Treatment: 5 Visit Information History Since Last Visit Added or deleted any medications: No Patient Arrived: Ambulatory Any new allergies or adverse reactions: No Arrival Time: 11:51 Had a fall or experienced change in No Accompanied By: family activities of daily living that may affect Transfer Assistance: None risk of falls: Patient Identification Verified: Yes Hospitalized since last visit: No Secondary Verification Process Completed: Yes Has Dressing in Place as Prescribed: Yes Patient Requires Transmission-Based No Has Footwear/Offloading in Place as Prescribed: Yes Precautions: Right: Surgical Shoe with Pressure Relief Patient Has Alerts: Yes Insole Patient Alerts: Patient on Blood Pain Present Now: Yes Thinner Electronic Signature(s) Signed: 01/04/2022 4:30:35 PM By: Levora Dredge Entered By: Levora Dredge on 01/04/2022 11:53:37 [name] ([id number]) -------------------------------------------------------------------------------- Clinic Level of Care Assessment Details Patient Name: [name]. Date of Service: 01/04/2022 11:30 AM Medical Record Number: [id number] Patient Account Number: [id number] Date of Birth/Sex: [birth year] (71 y.o. F) Treating RN: Levora Dredge Primary Care Jaiyah Beining: [name] Other Clinician: Referring Graycen Degan: [name] Treating Willette Mudry/Extender: Skipper Cliche in Treatment: 5 Clinic Level of Care Assessment Items TOOL 1 Quantity Score '[]'$  - Use when EandM and Procedure is performed on INITIAL  visit 0 ASSESSMENTS - Nursing Assessment / Reassessment '[]'$  - General Physical Exam (combine w/ comprehensive assessment (listed just below) when performed on new 0 pt. evals) '[]'$  - 0 Comprehensive Assessment (HX, ROS, Risk Assessments, Wounds Hx, etc.) ASSESSMENTS - Wound and Skin Assessment / Reassessment '[]'$  - Dermatologic / Skin Assessment (not related to wound area) 0 ASSESSMENTS - Ostomy and/or Continence Assessment and Care '[]'$  - Incontinence Assessment and Management 0 '[]'$  - 0 Ostomy Care Assessment and Management (repouching, etc.) PROCESS - Coordination of Care '[]'$  - Simple Patient / Family Education for ongoing care 0 '[]'$  - 0 Complex (extensive) Patient / Family Education for ongoing care '[]'$  - 0 Staff obtains Programmer, systems, Records, Test Results / Process Orders '[]'$  - 0 Staff telephones HHA, Nursing Homes / Clarify orders / etc '[]'$  - 0 Routine Transfer to another Facility (non-emergent condition) '[]'$  - 0 Routine Hospital Admission (non-emergent condition) '[]'$  - 0 New Admissions / Biomedical engineer / Ordering NPWT, Apligraf, etc. '[]'$  - 0 Emergency Hospital Admission (emergent condition) PROCESS - Special Needs '[]'$  - Pediatric / Minor Patient Management 0 '[]'$  - 0 Isolation Patient Management '[]'$  - 0 Hearing / Language / Visual special needs '[]'$  - 0 Assessment of Community assistance (transportation, D/C planning, etc.) '[]'$  - 0 Additional assistance / Altered mentation '[]'$  - 0 Support Surface(s) Assessment (bed, cushion, seat, etc.) INTERVENTIONS - Miscellaneous '[]'$  - External ear exam 0 '[]'$  - 0 Patient Transfer (multiple staff / Civil Service fast streamer / Similar devices) '[]'$  - 0 Simple Staple / Suture removal (25 or less) '[]'$  - 0 Complex Staple / Suture removal (26 or more) '[]'$  - 0 Hypo/Hyperglycemic Management (do not check if billed separately) '[]'$  - 0 Ankle / Brachial Index (ABI) - do not check if billed separately Has the patient been seen at the hospital within the last three years:  Yes Total Score: 0 Level Of Care: ____ [name] ([id number]) Electronic Signature(s) Signed: 01/04/2022 4:30:35 PM By: Levora Dredge Entered  By: Levora Dredge on 01/04/2022 13:31:18 [name] ([id number]) -------------------------------------------------------------------------------- Encounter Discharge Information Details Patient Name: [name] Date of Service: 01/04/2022 11:30 AM Medical Record Number: [id number] Patient Account Number: [id number] Date of Birth/Sex: July 22, 1950 (71 y.o. F) Treating RN: Levora Dredge Primary Care Avrom Robarts: [name] Other Clinician: Referring Shelvie Salsberry: [name] Treating Iniko Robles/Extender: Skipper Cliche in Treatment: 5 Encounter Discharge Information Items Post Procedure Vitals Discharge Condition: Stable Temperature (F): 98.1 Ambulatory Status: Ambulatory Pulse (bpm): 64 Discharge Destination: Home Respiratory Rate (breaths/min): 18 Transportation: Private Auto Blood Pressure (mmHg): 110/63 Accompanied By: family Schedule Follow-up Appointment: Yes Clinical Summary of Care: Electronic Signature(s) Signed: 01/04/2022 1:33:21 PM By: Levora Dredge Entered By: Levora Dredge on 01/04/2022 13:33:21 [name] ([id number]) -------------------------------------------------------------------------------- Lower Extremity Assessment Details Patient Name: [name]. Date of Service: 01/04/2022 11:30 AM Medical Record Number: [id number] Patient Account Number: [id number] Date of Birth/Sex: January 08, 1951 (71 y.o. F) Treating RN: Levora Dredge Primary Care Kailan Carmen: [name] Other Clinician: Referring Rockey Guarino: [name] Treating Jadasia Haws/Extender: Skipper Cliche in Treatment: 5 Edema Assessment Assessed: [Left: No] [Right: No] [Left: Edema] [Right: :] Calf Left: Right: Point of Measurement: 32 cm From Medial Instep 31.7 cm Ankle Left: Right: Point of Measurement: 10 cm From Medial Instep 21  cm Electronic Signature(s) Signed: 01/04/2022 4:30:35 PM By: Levora Dredge Entered By: Levora Dredge on 01/04/2022 12:11:13 Thibodaux, Kathlynn Mccarthy ([id number]) -------------------------------------------------------------------------------- Multi Wound Chart Details Patient Name: [name]. Date of Service: 01/04/2022 11:30 AM Medical Record Number: [id number] Patient Account Number: [id number] Date of Birth/Sex: [birth year] (71 y.o. F) Treating RN: Levora Dredge Primary Care Chalice Philbert: [name] Other Clinician: Referring Traeger Sultana: [name] Treating Comer Devins/Extender: Skipper Cliche in Treatment: 5 Vital Signs Height(in): 63 Pulse(bpm): 102 Weight(lbs): 147 Blood Pressure(mmHg): 109/75 Body Mass Index(BMI): 26 Temperature(F): 98.1 Respiratory Rate(breaths/min): 18 Photos: Wound Location: Right, Medial Lower Leg Right, Lateral Lower Leg Right, Plantar Foot Wounding Event: Gradually Appeared Gradually Appeared Bump Primary Etiology: Venous Leg Ulcer Venous Leg Ulcer To be determined Comorbid History: Hypertension, Peripheral Arterial Hypertension, Peripheral Arterial Hypertension, Peripheral Arterial Disease Disease Disease Date Acquired: 07/07/2021 07/08/2021 12/22/2021 Weeks of Treatment: 5 5 0 Wound Status: Open Open Open Wound Recurrence: No No No Measurements L x W x D (cm) 2.2x1x0.1 5.7x1.3x0.3 0.4x0.7x0.2 Area (cm) : 1.728 5.82 0.22 Volume (cm) : 0.173 1.746 0.044 % Reduction in Area: 38.90% 33.80% N/A % Reduction in Volume: 79.60% 50.40% N/A Classification: Full Thickness Without Exposed Full Thickness Without Exposed Full Thickness Without Exposed Support Structures Support Structures Support Structures Exudate Amount: Medium Medium Medium Exudate Type: Serosanguineous Serosanguineous Serosanguineous Exudate Color: red, brown red, brown red, brown Granulation Amount: Small (1-33%) Large (67-100%) None Present (0%) Granulation Quality: Pink Red N/A Necrotic  Amount: Large (67-100%) Small (1-33%) Large (67-100%) Exposed Structures: Fat Layer (Subcutaneous Tissue): Fat Layer (Subcutaneous Tissue): N/A Yes Yes Fascia: No Fascia: No Tendon: No Tendon: No Muscle: No Muscle: No Joint: No Joint: No Bone: No Bone: No Epithelialization: None None None Treatment Notes Electronic Signature(s) Signed: 01/04/2022 4:30:35 PM By: Levora Dredge Entered By: Levora Dredge on 01/04/2022 12:16:10 Bacigalupo, Kathlynn Mccarthy ([id number]) -------------------------------------------------------------------------------- [place] Details Patient Name: [name]. Date of Service: 01/04/2022 11:30 AM Medical Record Number: [id number] Patient Account Number: [id number] Date of Birth/Sex: 03-Jan-1951 (71 y.o. F) Treating RN: Levora Dredge Primary Care Gloriana Piltz: [name] Other Clinician: Referring Danelia Snodgrass: [name] Treating Fayth Trefry/Extender: Skipper Cliche in Treatment: 5 Active Inactive Wound/Skin Impairment Nursing Diagnoses: Knowledge deficit related to  ulceration/compromised skin integrity Goals: Patient/caregiver will verbalize understanding of skin care regimen Date Initiated: 11/30/2021 Target Resolution Date: 12/30/2021 Goal Status: Active Ulcer/skin breakdown will have a volume reduction of 30% by week 4 Date Initiated: 11/30/2021 Target Resolution Date: 01/30/2022 Goal Status: Active Ulcer/skin breakdown will have a volume reduction of 50% by week 8 Date Initiated: 11/30/2021 Target Resolution Date: 03/02/2022 Goal Status: Active Ulcer/skin breakdown will have a volume reduction of 80% by week 12 Date Initiated: 11/30/2021 Target Resolution Date: 04/01/2022 Goal Status: Active Ulcer/skin breakdown will heal within 14 weeks Date Initiated: 11/30/2021 Target Resolution Date: 05/02/2022 Goal Status: Active Interventions: Assess patient/caregiver ability to obtain necessary supplies Assess patient/caregiver ability to  perform ulcer/skin care regimen upon admission and as needed Assess ulceration(s) every visit Notes: Electronic Signature(s) Signed: 01/04/2022 4:30:35 PM By: Levora Dredge Entered By: Levora Dredge on 01/04/2022 12:16:01 Cleverly, Kathlynn Mccarthy ([id number]) -------------------------------------------------------------------------------- Pain Assessment Details Patient Name: [name]. Date of Service: 01/04/2022 11:30 AM Medical Record Number: [id number] Patient Account Number: [id number] Date of Birth/Sex: [birth year] (71 y.o. F) Treating RN: Levora Dredge Primary Care Tahiry Spicer: [name] Other Clinician: Referring Bram Hottel: [name] Treating Loyal Holzheimer/Extender: Skipper Cliche in Treatment: 5 Active Problems Location of Pain Severity and Description of Pain Patient Has Paino Yes Site Locations Rate the pain. Current Pain Level: 3 Pain Management and Medication Current Pain Management: Notes pt states pain in right leg wound site Electronic Signature(s) Signed: 01/04/2022 4:30:35 PM By: Levora Dredge Entered By: Levora Dredge on 01/04/2022 11:56:11 Hutmacher, Kathlynn Mccarthy ([id number]) -------------------------------------------------------------------------------- Patient/Caregiver Education Details Patient Name: [name]. Date of Service: 01/04/2022 11:30 AM Medical Record Number: [id number] Patient Account Number: [id number] Date of Birth/Gender: 11/04/1950 (71 y.o. F) Treating RN: Levora Dredge Primary Care Physician: [name] Other Clinician: Referring Physician: [name] Treating Physician/Extender: Skipper Cliche in Treatment: 5 Education Assessment Education Provided To: Patient and Caregiver Education Topics Provided Wound Debridement: Handouts: Wound Debridement Methods: Explain/Verbal Responses: State content correctly Wound/Skin Impairment: Handouts: Caring for Your Ulcer Methods: Explain/Verbal Responses: State content  correctly Electronic Signature(s) Signed: 01/04/2022 4:30:35 PM By: Levora Dredge Entered By: Levora Dredge on 01/04/2022 13:31:49 Funes, Kathlynn Mccarthy ([id number]) -------------------------------------------------------------------------------- Wound Assessment Details Patient Name: [name]. Date of Service: 01/04/2022 11:30 AM Medical Record Number: [id number] Patient Account Number: [id number] Date of Birth/Sex: [birth year] (71 y.o. F) Treating RN: Levora Dredge Primary Care Kaleeya Hancock: [name] Other Clinician: Referring Yuna Pizzolato: [name] Treating Shelbia Scinto/Extender: Skipper Cliche in Treatment: 5 Wound Status Wound Number: 1 Primary Etiology: Venous Leg Ulcer Wound Location: Right, Medial Lower Leg Wound Status: Open Wounding Event: Gradually Appeared Comorbid History: Hypertension, Peripheral Arterial Disease Date Acquired: 07/07/2021 Weeks Of Treatment: 5 Clustered Wound: No Photos Wound Measurements Length: (cm) 2.2 Width: (cm) 1 Depth: (cm) 0.1 Area: (cm) 1.728 Volume: (cm) 0.173 % Reduction in Area: 38.9% % Reduction in Volume: 79.6% Epithelialization: None Tunneling: No Undermining: No Wound Description Classification: Full Thickness Without Exposed Support Structures Exudate Amount: Medium Exudate Type: Serosanguineous Exudate Color: red, brown Foul Odor After Cleansing: No Slough/Fibrino Yes Wound Bed Granulation Amount: Small (1-33%) Exposed Structure Granulation Quality: Pink Fascia Exposed: No Necrotic Amount: Large (67-100%) Fat Layer (Subcutaneous Tissue) Exposed: Yes Necrotic Quality: Adherent Slough Tendon Exposed: No Muscle Exposed: No Joint Exposed: No Bone Exposed: No Treatment Notes Wound #1 (Lower Leg) Wound Laterality: Right, Medial Cleanser Hibiclens, 16 (oz) Discharge Instruction: Put Hibiclenso on your skin and rub it in gently for five minutes with a washcloth. Turn the water  back on and rinse very well with warm  water. Do not use your regular soap after using and rinsing Hibiclenso. Pat yourself dry with a clean towel. Peri-Wound Care MERILYNN, HAYDU ([id number]) Topical Primary Dressing Silvercel 4 1/4x 4 1/4 (in/in) Discharge Instruction: Apply Silvercel 4 1/4x 4 1/4 (in/in) as instructed Secondary Dressing ABD Pad 5x9 (in/in) Discharge Instruction: Cover with ABD pad Secured With [place] Surgical Tape, 2x2 (in/yd) Conform 4'' - Conforming Stretch Gauze Bandage 4x75 (in/in) Discharge Instruction: Apply as directed Tubigrip Size D, 3x10 (in/yd) Compression Wrap Compression Stockings Add-Ons Electronic Signature(s) Signed: 01/04/2022 4:30:35 PM By: Levora Dredge Entered By: Levora Dredge on 01/04/2022 12:08:41 [place], Kathlynn Mccarthy ([id number]) -------------------------------------------------------------------------------- Wound Assessment Details Patient Name: [name]. Date of Service: 01/04/2022 11:30 AM Medical Record Number: [id number] Patient Account Number: [id number] Date of Birth/Sex: [birth year] (71 y.o. F) Treating RN: Levora Dredge Primary Care Cynethia Schindler: [name] Other Clinician: Referring Michella Detjen: [name] Treating Emali Heyward/Extender: Skipper Cliche in Treatment: 5 Wound Status Wound Number: 2 Primary Etiology: Venous Leg Ulcer Wound Location: Right, Lateral Lower Leg Wound Status: Open Wounding Event: Gradually Appeared Comorbid History: Hypertension, Peripheral Arterial Disease Date Acquired: 07/08/2021 Weeks Of Treatment: 5 Clustered Wound: No Photos Wound Measurements Length: (cm) 5.7 Width: (cm) 1.3 Depth: (cm) 0.3 Area: (cm) 5.82 Volume: (cm) 1.746 % Reduction in Area: 33.8% % Reduction in Volume: 50.4% Epithelialization: None Tunneling: No Undermining: No Wound Description Classification: Full Thickness Without Exposed Support Structures Exudate Amount: Medium Exudate Type: Serosanguineous Exudate  Color: red, brown Foul Odor After Cleansing: No Slough/Fibrino Yes Wound Bed Granulation Amount: Large (67-100%) Exposed Structure Granulation Quality: Red Fascia Exposed: No Necrotic Amount: Small (1-33%) Fat Layer (Subcutaneous Tissue) Exposed: Yes Necrotic Quality: Adherent Slough Tendon Exposed: No Muscle Exposed: No Joint Exposed: No Bone Exposed: No Treatment Notes Wound #2 (Lower Leg) Wound Laterality: Right, Lateral Cleanser Hibiclens, 16 (oz) Discharge Instruction: Put Hibiclenso on your skin and rub it in gently for five minutes with a washcloth. Turn the water back on and rinse very well with warm water. Do not use your regular soap after using and rinsing Hibiclenso. Pat yourself dry with a clean towel. Peri-Wound Care RIM, THATCH ([id number]) Topical Primary Dressing Silvercel 4 1/4x 4 1/4 (in/in) Discharge Instruction: Apply Silvercel 4 1/4x 4 1/4 (in/in) as instructed Secondary Dressing ABD Pad 5x9 (in/in) Discharge Instruction: Cover with ABD pad Secured With [employer] Surgical Tape, 2x2 (in/yd) Conform 4'' - Conforming Stretch Gauze Bandage 4x75 (in/in) Discharge Instruction: Apply as directed Tubigrip Size D, 3x10 (in/yd) Compression Wrap Compression Stockings Add-Ons Electronic Signature(s) Signed: 01/04/2022 4:30:35 PM By: Levora Dredge Entered By: Levora Dredge on 01/04/2022 12:09:16 Celestin, Kathlynn Mccarthy ([id number]) -------------------------------------------------------------------------------- Wound Assessment Details Patient Name: [name]. Date of Service: 01/04/2022 11:30 AM Medical Record Number: [id number] Patient Account Number: [id number] Date of Birth/Sex: 19-May-1951 (71 y.o. F) Treating RN: Levora Dredge Primary Care Javonna Balli: [name] Other Clinician: Referring Briggett Tuccillo: [name] Treating Serenna Deroy/Extender: Skipper Cliche in Treatment: 5 Wound Status Wound Number: 3 Primary  Etiology: To be determined Wound Location: Right, Plantar Foot Wound Status: Open Wounding Event: Bump Comorbid History: Hypertension, Peripheral Arterial Disease Date Acquired: 12/22/2021 Weeks Of Treatment: 0 Clustered Wound: No Photos Wound Measurements Length: (cm) 0.4 Width: (cm) 0.7 Depth: (cm) 0.2 Area: (cm) 0.22 Volume: (cm) 0.044 % Reduction in Area: % Reduction in Volume: Epithelialization: None Tunneling: No Undermining: No Wound Description  Classification: Full Thickness Without Exposed Support Structures Exudate Amount: Medium Exudate Type: Serosanguineous Exudate Color: red, brown Foul Odor After Cleansing: No Slough/Fibrino Yes Wound Bed Granulation Amount: None Present (0%) Necrotic Amount: Large (67-100%) Necrotic Quality: Adherent Therapist, music) Signed: 01/04/2022 4:30:35 PM By: Levora Dredge Entered By: Levora Dredge on 01/04/2022 12:10:50 [place], Kathlynn Mccarthy ([id number]) -------------------------------------------------------------------------------- Vitals Details Patient Name: [name]. Date of Service: 01/04/2022 11:30 AM Medical Record Number: [id number] Patient Account Number: [id number] Date of Birth/Sex: 26-Feb-1951 (71 y.o. F) Treating RN: Levora Dredge Primary Care Teresa Lemmerman: [name] Other Clinician: Referring Alexina Niccoli: [name] Treating Armanda Forand/Extender: Skipper Cliche in Treatment: 5 Vital Signs Time Taken: 11:53 Temperature (F): 98.1 Height (in): 63 Pulse (bpm): 102 Weight (lbs): 147 Respiratory Rate (breaths/min): 18 Body Mass Index (BMI): 26 Blood Pressure (mmHg): 109/75 Reference Range: 80 - 120 mg / dl Electronic Signature(s) Signed: 01/04/2022 4:30:35 PM By: Levora Dredge Entered By: Levora Dredge on 01/04/2022 11:55:55

## 2022-01-04 NOTE — Progress Notes (Addendum)
[name], [name] ([id number]) Visit Report for 01/04/2022 Chief Complaint Document Details Patient Name: [name], [name]. Date of Service: 01/04/2022 11:30 AM Medical Record Number: [id number] Patient Account Number: [id number] Date of Birth/Sex: 1951-05-17 (71 y.[name]. F) Treating RN: Levora Dredge Primary Care Provider: [name] Other Clinician: Referring Provider: [name] Treating Provider/Extender: Skipper Cliche in Treatment: 5 Information Obtained from: Patient Chief Complaint Right LE Ulcers Electronic Signature(s) Signed: 01/04/2022 12:04:58 PM By: Worthy Keeler PA-C Entered By: Worthy Keeler on 01/04/2022 12:04:58 Raynes, [name] ([id number]) -------------------------------------------------------------------------------- Debridement Details Patient Name: [name]. Date of Service: 01/04/2022 11:30 AM Medical Record Number: [id number] Patient Account Number: [id number] Date of Birth/Sex: 11/19/1950 (58 y.[name]. F) Treating RN: Levora Dredge Primary Care Provider: [name] Other Clinician: Referring Provider: [name] Treating Provider/Extender: Skipper Cliche in Treatment: 5 Debridement Performed for Wound #3 Right,Plantar Foot Assessment: Performed By: Physician Tommie Sams., PA-C Debridement Type: Debridement Level of Consciousness (Pre- Awake and Alert procedure): Pre-procedure Verification/Time Out Yes - 12:20 Taken: Total Area Debrided (L x W): 1 (cm) x 1 (cm) = 1 (cm) Tissue and other material Non-Viable, Skin: Dermis , Skin: Epidermis debrided: Level: Skin/Epidermis Debridement Description: Selective/Open Wound Instrument: Forceps, Scissors Bleeding: None Response to Treatment: Procedure was tolerated well Level of Consciousness (Post- Awake and Alert procedure): Post Debridement Measurements of Total Wound Length: (cm) 0.4 Width: (cm) 0.7 Depth: (cm) 0.1 Volume: (cm) 0.022 Character of Wound/Ulcer Post Debridement:  Stable Post Procedure Diagnosis Same as Pre-procedure Notes dry skin only removed Electronic Signature(s) Signed: 01/04/2022 4:30:35 PM By: Levora Dredge Signed: 01/06/2022 6:41:29 PM By: Worthy Keeler PA-C Entered By: Levora Dredge on 01/04/2022 12:21:24 [name], [name] ([id number]) -------------------------------------------------------------------------------- HPI Details Patient Name: [name]. Date of Service: 01/04/2022 11:30 AM Medical Record Number: [id number] Patient Account Number: [id number] Date of Birth/Sex: 07/20/1950 (38 y.[name]. F) Treating RN: Levora Dredge Primary Care Provider: [name] Other Clinician: Referring Provider: [name] Treating Provider/Extender: Skipper Cliche in Treatment: 5 History of Present Illness HPI Description: 11-30-2021 upon evaluation today patient presents for initial inspection here in our clinic concerning wounds that she has over the right lateral leg as well as the right medial leg. Subsequently the patient is dealing with lower extremity edema which I do believe has been one of the big causes of what is going on here. She also does have what appears to be cellulitis of the leg as well as peripheral vascular disease. She already has amputation of a toe on the right foot. With that being said I did review that surgical pathology from the amputation this was the right third toe which showed a gangrenous ulcer with underlying cellulitis negative for acute osteomyelitis but acute cellulitis was present in the proximal margin. There was also noted on this pathology report a biopsy from the right leg which showed a gangrenous ulcer with underlying cellulitis and foreign body type giant cell reaction. That was performed on 11-11-2021 and resulted on 11-15-2021 She is also had lower extremity arterial duplex studies which did show that she had falsely elevated ABI on the right with a TBI of 0.45. On the left she had an ABI of 1.05 with  a TBI of 0.12. She is pretty much monophasic throughout. With that being said there was definite reason for concern with regard to her blood flow into the legs she does have a follow-up with the vascular surgeon coming up shortly as well. 12-14-2021 upon evaluation today patient  appears to be doing decently well currently in regard to her wound. We are definitely seeing some signs of improvement there is definitely some necrotic tissue as well but try to clean some this away carefully today. Fortunately I think that she is going to tolerate the debridement without complication she otherwise seems to be healing excellent. I think get a lot of the dead tissue clearway will make a big difference as far as her healing is concerned. 12-21-2021 upon evaluation today patient appears to be doing well currently in regard to her legs she has been using the Hibiclens which she tells me seems to be helping. She tells me the leg feels much better washing with this compared to what it was previous which is great news. Fortunately I do not see any signs of active infection and I am overall extremely pleased with where we stand. I do think we are on the right track here. 01-04-2022 upon evaluation today patient appears to be doing unfortunately somewhat worse in regard to the new pustule areas that are appearing on her lower extremity. Again I have discussed with her based on what I am seeing that though she does have a wound I do not feel like these new pustule areas are related to the wound in particular. Again I am unsure of exactly what is going on and I really think she would benefit from seeing a dermatology specialist. She voiced understanding and is not opposed to this she did see a dermatologist in [place] but they were under the impression they were just to do a biopsy which she had already had and therefore really did not do much she tells me that so I really know about that encounter. With that being said in  general I do feel like that the patient is doing better although that is more in relation to the wound I think these new pustule areas still have me concerned. I do think she could be a candidate for topical antibiotics such as [employer] but at the same time I do think that we need to continue to monitor for any signs of overall worsening infection in the interim. Electronic Signature(s) Signed: 01/04/2022 1:11:26 PM By: Worthy Keeler PA-C Entered By: Worthy Keeler on 01/04/2022 13:11:26 [name], [name] ([id number]) -------------------------------------------------------------------------------- Physical Exam Details Patient Name: [name] Date of Service: 01/04/2022 11:30 AM Medical Record Number: [id number] Patient Account Number: [id number] Date of Birth/Sex: 1950-12-16 (37 y.[name]. F) Treating RN: Levora Dredge Primary Care Provider: [name] Other Clinician: Referring Provider: [name] Treating Provider/Extender: Skipper Cliche in Treatment: 5 Constitutional Well-nourished and well-hydrated in no acute distress. Respiratory normal breathing without difficulty. Psychiatric this patient is able to make decisions and demonstrates good insight into disease process. Alert and Oriented x 3. pleasant and cooperative. Notes Upon inspection patient's wound bed again showed signs of fairly good granulation and epithelization at this point. Fortunately I do not see any evidence of active infection locally or systemically which is great news and overall I am extremely pleased with where we stand. Electronic Signature(s) Signed: 01/04/2022 1:12:07 PM By: Worthy Keeler PA-C Entered By: Worthy Keeler on 01/04/2022 13:12:07 [name], [name] ([id number]) -------------------------------------------------------------------------------- Physician Orders Details Patient Name: [name]. Date of Service: 01/04/2022 11:30 AM Medical Record Number: [id number] Patient Account  Number: [id number] Date of Birth/Sex: [birth year] (36 y.[name]. F) Treating RN: Levora Dredge Primary Care Provider: [name] Other Clinician: Referring Provider: [name] Treating Provider/Extender: Jeri Cos  Weeks in Treatment: 5 Verbal / Phone Orders: No Diagnosis Coding ICD-10 Coding Code Description I87.331 Chronic venous hypertension (idiopathic) with ulcer and inflammation of right lower extremity L97.812 Non-pressure chronic ulcer of other part of right lower leg with fat layer exposed L03.115 Cellulitis of right lower limb I73.89 Other specified peripheral vascular diseases Z89.421 Acquired absence of other right toe(s) Follow-up Appointments [name] Return Appointment in 1 week. Bathing/ Shower/ Hygiene [name] May shower; gently cleanse wound with antibacterial soap, rinse and pat dry prior to dressing wounds - Hibiclens recommended for washing right lower leg prior to dressing changes. You may pick this up at a pharmacy or purchase on [place] [name] No tub bath. Edema Control - Lymphedema / Segmental Compressive Device / Other [name] Tubigrip single layer applied. - size D [name] Elevate, Exercise Daily and Avoid Standing for Long Periods of Time. [name] Elevate legs to the level of the heart and pump ankles as often as possible [name] Elevate leg(s) parallel to the floor when sitting. Wound Treatment Wound #1 - Lower Leg Wound Laterality: Right, Medial Cleanser: Hibiclens, 16 (oz) 3 x Per Week/30 Days Discharge Instructions: Put Hibiclenso on your skin and rub it in gently for five minutes with a washcloth. Turn the water back on and rinse very well with warm water. Do not use your regular soap after using and rinsing Hibiclenso. Pat yourself dry with a clean towel. Primary Dressing: Silvercel 4 1/4x 4 1/4 (in/in) 3 x Per Week/30 Days Discharge Instructions: Apply Silvercel 4 1/4x 4 1/4 (in/in) as instructed Secondary Dressing: ABD Pad 5x9 (in/in) 3 x Per Week/30 Days Discharge Instructions:  Cover with ABD pad Secured With: Medipore Tape - 25M Medipore H Soft Cloth Surgical Tape, 2x2 (in/yd) 3 x Per Week/30 Days Secured With: Conform 4'' - Conforming Stretch Gauze Bandage 4x75 (in/in) 3 x Per Week/30 Days Discharge Instructions: Apply as directed Secured With: Tubigrip Size D, 3x10 (in/yd) 3 x Per Week/30 Days Wound #2 - Lower Leg Wound Laterality: Right, Lateral Cleanser: Hibiclens, 16 (oz) 3 x Per Week/30 Days Discharge Instructions: Put Hibiclenso on your skin and rub it in gently for five minutes with a washcloth. Turn the water back on and rinse very well with warm water. Do not use your regular soap after using and rinsing Hibiclenso. Pat yourself dry with a clean towel. Primary Dressing: Silvercel 4 1/4x 4 1/4 (in/in) 3 x Per Week/30 Days Discharge Instructions: Apply Silvercel 4 1/4x 4 1/4 (in/in) as instructed Secondary Dressing: ABD Pad 5x9 (in/in) 3 x Per Week/30 Days Discharge Instructions: Cover with ABD pad Secured With: Medipore Tape - 25M Medipore H Soft Cloth Surgical Tape, 2x2 (in/yd) 3 x Per Week/30 Days [name], [name] ([id number]) Secured With: Conform 4'' - Conforming Stretch Gauze Bandage 4x75 (in/in) 3 x Per Week/30 Days Discharge Instructions: Apply as directed Secured With: Tubigrip Size D, 3x10 (in/yd) 3 x Per Week/30 Days Wound #3 - Foot Wound Laterality: Plantar, Right Cleanser: Hibiclens, 16 (oz) 3 x Per Week/30 Days Discharge Instructions: Put Hibiclenso on your skin and rub it in gently for five minutes with a washcloth. Turn the water back on and rinse very well with warm water. Do not use your regular soap after using and rinsing Hibiclenso. Pat yourself dry with a clean towel. Primary Dressing: Silvercel 4 1/4x 4 1/4 (in/in) 3 x Per Week/30 Days Discharge Instructions: Apply Silvercel 4 1/4x 4 1/4 (in/in) as instructed Secondary Dressing: ABD Pad 5x9 (in/in) 3 x Per Week/30 Days Discharge Instructions:  Cover with ABD pad Secured With: Medipore  Tape - 14M Medipore H Soft Cloth Surgical Tape, 2x2 (in/yd) 3 x Per Week/30 Days Secured With: Conform 4'' - Conforming Stretch Gauze Bandage 4x75 (in/in) 3 x Per Week/30 Days Discharge Instructions: Apply as directed Secured With: Tubigrip Size D, 3x10 (in/yd) 3 x Per Week/30 Days Consults [name] Dermatology - red bumps, pustule areas showing up on top, bottom and on second toe of right foot. Laboratory [name] Bacteria identified in Wound by Culture (MICRO) - right lower leg oooo LOINC Code: 9518-8 oooo Convenience Name: Wound culture routine Electronic Signature(s) Signed: 01/04/2022 4:30:35 PM By: Levora Dredge Signed: 01/06/2022 6:41:29 PM By: Worthy Keeler PA-C Entered By: Levora Dredge on 01/04/2022 12:39:04 [name], [name] ([id number]) -------------------------------------------------------------------------------- Problem List Details Patient Name: [name], [name]. Date of Service: 01/04/2022 11:30 AM Medical Record Number: [id number] Patient Account Number: [id number] Date of Birth/Sex: 15-Sep-1950 (63 y.[name]. F) Treating RN: Levora Dredge Primary Care Provider: [name] Other Clinician: Referring Provider: [name] Treating Provider/Extender: Skipper Cliche in Treatment: 5 Active Problems ICD-10 Encounter Code Description Active Date MDM Diagnosis I87.331 Chronic venous hypertension (idiopathic) with ulcer and inflammation of 11/30/2021 No Yes right lower extremity L97.812 Non-pressure chronic ulcer of other part of right lower leg with fat layer 11/30/2021 No Yes exposed L03.115 Cellulitis of right lower limb 11/30/2021 No Yes I73.89 Other specified peripheral vascular diseases 11/30/2021 No Yes Z89.421 Acquired absence of other right toe(s) 11/30/2021 No Yes Inactive Problems Resolved Problems Electronic Signature(s) Signed: 01/04/2022 12:04:53 PM By: Worthy Keeler PA-C Entered By: Worthy Keeler on 01/04/2022 12:04:53 [name], [name]  ([id number]) -------------------------------------------------------------------------------- Progress Note Details Patient Name: [name]. Date of Service: 01/04/2022 11:30 AM Medical Record Number: [id number] Patient Account Number: [id number] Date of Birth/Sex: 11-30-1950 (49 y.[name]. F) Treating RN: Levora Dredge Primary Care Provider: [name] Other Clinician: Referring Provider: [name] Treating Provider/Extender: Skipper Cliche in Treatment: 5 Subjective Chief Complaint Information obtained from Patient Right LE Ulcers History of Present Illness (HPI) 11-30-2021 upon evaluation today patient presents for initial inspection here in our clinic concerning wounds that she has over the right lateral leg as well as the right medial leg. Subsequently the patient is dealing with lower extremity edema which I do believe has been one of the big causes of what is going on here. She also does have what appears to be cellulitis of the leg as well as peripheral vascular disease. She already has amputation of a toe on the right foot. With that being said I did review that surgical pathology from the amputation this was the right third toe which showed a gangrenous ulcer with underlying cellulitis negative for acute osteomyelitis but acute cellulitis was present in the proximal margin. There was also noted on this pathology report a biopsy from the right leg which showed a gangrenous ulcer with underlying cellulitis and foreign body type giant cell reaction. That was performed on 11-11-2021 and resulted on 11-15-2021 She is also had lower extremity arterial duplex studies which did show that she had falsely elevated ABI on the right with a TBI of 0.45. On the left she had an ABI of 1.05 with a TBI of 0.12. She is pretty much monophasic throughout. With that being said there was definite reason for concern with regard to her blood flow into the legs she does have a follow-up with the vascular  surgeon coming up shortly as well. 12-14-2021 upon evaluation today patient appears to  be doing decently well currently in regard to her wound. We are definitely seeing some signs of improvement there is definitely some necrotic tissue as well but try to clean some this away carefully today. Fortunately I think that she is going to tolerate the debridement without complication she otherwise seems to be healing excellent. I think get a lot of the dead tissue clearway will make a big difference as far as her healing is concerned. 12-21-2021 upon evaluation today patient appears to be doing well currently in regard to her legs she has been using the Hibiclens which she tells me seems to be helping. She tells me the leg feels much better washing with this compared to what it was previous which is great news. Fortunately I do not see any signs of active infection and I am overall extremely pleased with where we stand. I do think we are on the right track here. 01-04-2022 upon evaluation today patient appears to be doing unfortunately somewhat worse in regard to the new pustule areas that are appearing on her lower extremity. Again I have discussed with her based on what I am seeing that though she does have a wound I do not feel like these new pustule areas are related to the wound in particular. Again I am unsure of exactly what is going on and I really think she would benefit from seeing a dermatology specialist. She voiced understanding and is not opposed to this she did see a dermatologist in [place] but they were under the impression they were just to do a biopsy which she had already had and therefore really did not do much she tells me that so I really know about that encounter. With that being said in general I do feel like that the patient is doing better although that is more in relation to the wound I think these new pustule areas still have me concerned. I do think she could be a candidate for  topical antibiotics such as [employer] but at the same time I do think that we need to continue to monitor for any signs of overall worsening infection in the interim. Objective Constitutional Well-nourished and well-hydrated in no acute distress. Vitals Time Taken: 11:53 AM, Height: 63 in, Weight: 147 lbs, BMI: 26, Temperature: 98.1 F, Pulse: 102 bpm, Respiratory Rate: 18 breaths/min, Blood Pressure: 109/75 mmHg. Respiratory normal breathing without difficulty. Psychiatric this patient is able to make decisions and demonstrates good insight into disease process. Alert and Oriented x 3. pleasant and cooperative. General Notes: Upon inspection patient's wound bed again showed signs of fairly good granulation and epithelization at this point. Fortunately I do not see any evidence of active infection locally or systemically which is great news and overall I am extremely pleased with where we stand. [name], [name] ([id number]) Integumentary (Hair, Skin) Wound #1 status is Open. Original cause of wound was Gradually Appeared. The date acquired was: 07/07/2021. The wound has been in treatment 5 weeks. The wound is located on the Right,Medial Lower Leg. The wound measures 2.2cm length x 1cm width x 0.1cm depth; 1.728cm^2 area and 0.173cm^3 volume. There is Fat Layer (Subcutaneous Tissue) exposed. There is no tunneling or undermining noted. There is a medium amount of serosanguineous drainage noted. There is small (1-33%) pink granulation within the wound bed. There is a large (67-100%) amount of necrotic tissue within the wound bed including Adherent Slough. Wound #2 status is Open. Original cause of wound was Gradually Appeared. The date acquired was:  07/08/2021. The wound has been in treatment 5 weeks. The wound is located on the Right,Lateral Lower Leg. The wound measures 5.7cm length x 1.3cm width x 0.3cm depth; 5.82cm^2 area and 1.746cm^3 volume. There is Fat Layer (Subcutaneous Tissue) exposed.  There is no tunneling or undermining noted. There is a medium amount of serosanguineous drainage noted. There is large (67-100%) red granulation within the wound bed. There is a small (1-33%) amount of necrotic tissue within the wound bed including Adherent Slough. Wound #3 status is Open. Original cause of wound was Bump. The date acquired was: 12/22/2021. The wound is located on the [place]. The wound measures 0.4cm length x 0.7cm width x 0.2cm depth; 0.22cm^2 area and 0.044cm^3 volume. There is no tunneling or undermining noted. There is a medium amount of serosanguineous drainage noted. There is no granulation within the wound bed. There is a large (67-100%) amount of necrotic tissue within the wound bed including Adherent Slough. Assessment Active Problems ICD-10 Chronic venous hypertension (idiopathic) with ulcer and inflammation of right lower extremity Non-pressure chronic ulcer of other part of right lower leg with fat layer exposed Cellulitis of right lower limb Other specified peripheral vascular diseases Acquired absence of other right toe(s) Procedures Wound #3 Pre-procedure diagnosis of Wound #3 is a Cellulitis located on the [place] . There was a Selective/Open Wound Skin/Epidermis Debridement with a total area of 1 sq cm performed by Tommie Sams., PA-C. With the following instrument(s): Forceps, and Scissors to remove Non-Viable tissue/material. Material removed includes Skin: Dermis and Skin: Epidermis and. No specimens were taken. A time out was conducted at 12:20, prior to the start of the procedure. There was no bleeding. The procedure was tolerated well. Post Debridement Measurements: 0.4cm length x 0.7cm width x 0.1cm depth; 0.022cm^3 volume. Character of Wound/Ulcer Post Debridement is stable. Post procedure Diagnosis Wound #3: Same as Pre-Procedure General Notes: dry skin only removed. Plan Follow-up Appointments: Return Appointment in 1  week. Bathing/ Shower/ Hygiene: May shower; gently cleanse wound with antibacterial soap, rinse and pat dry prior to dressing wounds - Hibiclens recommended for washing right lower leg prior to dressing changes. You may pick this up at a pharmacy or purchase on [place] No tub bath. Edema Control - Lymphedema / Segmental Compressive Device / Other: Tubigrip single layer applied. - size D Elevate, Exercise Daily and Avoid Standing for Long Periods of Time. Elevate legs to the level of the heart and pump ankles as often as possible Elevate leg(s) parallel to the floor when sitting. Consults ordered were: Dermatology - red bumps, pustule areas showing up on top, bottom and on second toe of right foot. Laboratory ordered were: Wound culture routine - right lower leg WOUND #1: - Lower Leg Wound Laterality: Right, Medial Cleanser: Hibiclens, 16 (oz) 3 x Per Week/30 Days Discharge Instructions: Put Hibiclens on your skin and rub it in gently for five minutes with a washcloth. Turn the water back on and rinse very well with warm water. Do not use your regular soap after using and rinsing Hibiclens. Pat yourself dry with a clean towel. [name], OPPERMAN ([id number]) Primary Dressing: Silvercel 4 1/4x 4 1/4 (in/in) 3 x Per Week/30 Days Discharge Instructions: Apply Silvercel 4 1/4x 4 1/4 (in/in) as instructed Secondary Dressing: ABD Pad 5x9 (in/in) 3 x Per Week/30 Days Discharge Instructions: Cover with ABD pad Secured With: Medipore Tape - 16M Medipore H Soft Cloth Surgical Tape, 2x2 (in/yd) 3 x Per Week/30 Days Secured With: Conform 4'' -  Conforming Stretch Gauze Bandage 4x75 (in/in) 3 x Per Week/30 Days Discharge Instructions: Apply as directed Secured With: Tubigrip Size D, 3x10 (in/yd) 3 x Per Week/30 Days WOUND #2: - Lower Leg Wound Laterality: Right, Lateral Cleanser: Hibiclens, 16 (oz) 3 x Per Week/30 Days Discharge Instructions: Put Hibiclens on your skin and rub it in gently for  five minutes with a washcloth. Turn the water back on and rinse very well with warm water. Do not use your regular soap after using and rinsing Hibiclens. Pat yourself dry with a clean towel. Primary Dressing: Silvercel 4 1/4x 4 1/4 (in/in) 3 x Per Week/30 Days Discharge Instructions: Apply Silvercel 4 1/4x 4 1/4 (in/in) as instructed Secondary Dressing: ABD Pad 5x9 (in/in) 3 x Per Week/30 Days Discharge Instructions: Cover with ABD pad Secured With: Medipore Tape - 67M Medipore H Soft Cloth Surgical Tape, 2x2 (in/yd) 3 x Per Week/30 Days Secured With: Conform 4'' - Conforming Stretch Gauze Bandage 4x75 (in/in) 3 x Per Week/30 Days Discharge Instructions: Apply as directed Secured With: Tubigrip Size D, 3x10 (in/yd) 3 x Per Week/30 Days WOUND #3: - Foot Wound Laterality: Plantar, Right Cleanser: Hibiclens, 16 (oz) 3 x Per Week/30 Days Discharge Instructions: Put Hibiclens on your skin and rub it in gently for five minutes with a washcloth. Turn the water back on and rinse very well with warm water. Do not use your regular soap after using and rinsing Hibiclens. Pat yourself dry with a clean towel. Primary Dressing: Silvercel 4 1/4x 4 1/4 (in/in) 3 x Per Week/30 Days Discharge Instructions: Apply Silvercel 4 1/4x 4 1/4 (in/in) as instructed Secondary Dressing: ABD Pad 5x9 (in/in) 3 x Per Week/30 Days Discharge Instructions: Cover with ABD pad Secured With: Medipore Tape - 67M Medipore H Soft Cloth Surgical Tape, 2x2 (in/yd) 3 x Per Week/30 Days Secured With: Conform 4'' - Conforming Stretch Gauze Bandage 4x75 (in/in) 3 x Per Week/30 Days Discharge Instructions: Apply as directed Secured With: Tubigrip Size D, 3x10 (in/yd) 3 x Per Week/30 Days 1. I am going to recommend that we have the patient go ahead and continue with the Hibiclens washings which she states does seem to be helping to some degree. 2. I am going to recommend as well that we continue with the silver cell for the wound  itself and over any of the open or draining areas as that seems to help dry them up to some degree. 3. I am going to suggest as well that we continue with the ABD pads to cover followed by Tubigrip size D which I do believe has been of benefit for her from a compression standpoint. 4. With regard to how things seem to be progressing I am concerned here that is not improving as fast as I would like to see for that reason I do think that this is even more of a dermatologic issue versus wound care to be honest with these new pustule areas showing up. I would like for her to see a dermatologist to see what they feel may be going on. Again we have addressed things from an antibiotic standpoint and not bacterial standpoint in general but nonetheless she seems to continue to have ongoing new areas showing up every time I see her. We will see patient back for reevaluation in 1 week here in the clinic. If anything worsens or changes patient will contact our office for additional recommendations. Electronic Signature(s) Signed: 01/04/2022 1:13:07 PM By: Worthy Keeler PA-C Entered By: Worthy Keeler  on 01/04/2022 13:13:07 MYCAH, MCDOUGALL ([id number]) -------------------------------------------------------------------------------- SuperBill Details Patient Name: AVALINE, STILLSON. Date of Service: 01/04/2022 Medical Record Number: [id number] Patient Account Number: [id number] Date of Birth/Sex: 04/12/1951 (66 y.[name]. F) Treating RN: Levora Dredge Primary Care Provider: [name] Other Clinician: Referring Provider: [name] Treating Provider/Extender: Skipper Cliche in Treatment: 5 Diagnosis Coding ICD-10 Codes Code Description [phone] Chronic venous hypertension (idiopathic) with ulcer and inflammation of right lower extremity L97.812 Non-pressure chronic ulcer of other part of right lower leg with fat layer exposed L03.115 Cellulitis of right lower limb I73.89 Other specified peripheral  vascular diseases Z89.421 Acquired absence of other right toe(s) Facility Procedures CPT4 Code: 50932671 Description: [phone] - DEBRIDE WOUND 1ST 20 SQ CM OR < Modifier: Quantity: 1 CPT4 Code: Description: ICD-10 Diagnosis Description D98.338 Non-pressure chronic ulcer of other part of right lower leg with fat layer Modifier: exposed Quantity: Physician Procedures CPT4 Code: 2505397 Description: 67341 - WC PHYS DEBR WO ANESTH 20 SQ CM Modifier: Quantity: 1 CPT4 Code: Description: ICD-10 Diagnosis Description P37.902 Non-pressure chronic ulcer of other part of right lower leg with fat layer Modifier: exposed Quantity: Electronic Signature(s) Signed: 01/04/2022 1:31:27 PM By: Levora Dredge Signed: 01/06/2022 6:41:29 PM By: Worthy Keeler PA-C Previous Signature: 01/04/2022 1:13:30 PM Version By: Worthy Keeler PA-C Entered By: Levora Dredge on 01/04/2022 13:31:27

## 2022-01-06 DIAGNOSIS — R2689 Other abnormalities of gait and mobility: Secondary | ICD-10-CM | POA: Diagnosis not present

## 2022-01-06 DIAGNOSIS — M79671 Pain in right foot: Secondary | ICD-10-CM | POA: Diagnosis not present

## 2022-01-06 DIAGNOSIS — M6281 Muscle weakness (generalized): Secondary | ICD-10-CM | POA: Diagnosis not present

## 2022-01-06 DIAGNOSIS — M25674 Stiffness of right foot, not elsewhere classified: Secondary | ICD-10-CM | POA: Diagnosis not present

## 2022-01-06 LAB — AEROBIC CULTURE W GRAM STAIN (SUPERFICIAL SPECIMEN)
Culture: NO GROWTH
Gram Stain: NONE SEEN

## 2022-01-10 DIAGNOSIS — R2689 Other abnormalities of gait and mobility: Secondary | ICD-10-CM | POA: Diagnosis not present

## 2022-01-10 DIAGNOSIS — M6281 Muscle weakness (generalized): Secondary | ICD-10-CM | POA: Diagnosis not present

## 2022-01-10 DIAGNOSIS — L02611 Cutaneous abscess of right foot: Secondary | ICD-10-CM | POA: Diagnosis not present

## 2022-01-10 DIAGNOSIS — C50919 Malignant neoplasm of unspecified site of unspecified female breast: Secondary | ICD-10-CM | POA: Diagnosis not present

## 2022-01-10 DIAGNOSIS — M25674 Stiffness of right foot, not elsewhere classified: Secondary | ICD-10-CM | POA: Diagnosis not present

## 2022-01-10 DIAGNOSIS — I96 Gangrene, not elsewhere classified: Secondary | ICD-10-CM | POA: Diagnosis not present

## 2022-01-10 DIAGNOSIS — R7303 Prediabetes: Secondary | ICD-10-CM | POA: Diagnosis not present

## 2022-01-10 DIAGNOSIS — I10 Essential (primary) hypertension: Secondary | ICD-10-CM | POA: Diagnosis not present

## 2022-01-10 DIAGNOSIS — M1990 Unspecified osteoarthritis, unspecified site: Secondary | ICD-10-CM | POA: Diagnosis not present

## 2022-01-10 DIAGNOSIS — M79671 Pain in right foot: Secondary | ICD-10-CM | POA: Diagnosis not present

## 2022-01-10 DIAGNOSIS — E871 Hypo-osmolality and hyponatremia: Secondary | ICD-10-CM | POA: Diagnosis not present

## 2022-01-10 DIAGNOSIS — D649 Anemia, unspecified: Secondary | ICD-10-CM | POA: Diagnosis not present

## 2022-01-11 ENCOUNTER — Encounter: Payer: Medicare HMO | Admitting: Physician Assistant

## 2022-01-11 DIAGNOSIS — L97518 Non-pressure chronic ulcer of other part of right foot with other specified severity: Secondary | ICD-10-CM | POA: Diagnosis not present

## 2022-01-11 DIAGNOSIS — S51812A Laceration without foreign body of left forearm, initial encounter: Secondary | ICD-10-CM | POA: Diagnosis not present

## 2022-01-11 DIAGNOSIS — Z89421 Acquired absence of other right toe(s): Secondary | ICD-10-CM | POA: Diagnosis not present

## 2022-01-11 DIAGNOSIS — R21 Rash and other nonspecific skin eruption: Secondary | ICD-10-CM | POA: Diagnosis not present

## 2022-01-11 DIAGNOSIS — I87331 Chronic venous hypertension (idiopathic) with ulcer and inflammation of right lower extremity: Secondary | ICD-10-CM | POA: Diagnosis not present

## 2022-01-11 DIAGNOSIS — L089 Local infection of the skin and subcutaneous tissue, unspecified: Secondary | ICD-10-CM | POA: Diagnosis not present

## 2022-01-11 DIAGNOSIS — L97812 Non-pressure chronic ulcer of other part of right lower leg with fat layer exposed: Secondary | ICD-10-CM | POA: Diagnosis not present

## 2022-01-11 DIAGNOSIS — L03115 Cellulitis of right lower limb: Secondary | ICD-10-CM | POA: Diagnosis not present

## 2022-01-11 NOTE — Progress Notes (Signed)
[name], [name] ([id number]) Visit Report for 01/11/2022 Arrival Information Details Patient Name: [name], [name]. Date of Service: 01/11/2022 11:15 AM Medical Record Number: [id number] Patient Account Number: [id number] Date of Birth/Sex: 21-Jul-1950 (71 y.o. F) Treating RN: [name] Primary Care [name]: [name] Other Clinician: [name] Referring [name]: [name] Treating [name]/Extender: [name] in Treatment: 6 Visit Information History Since Last Visit All ordered tests and consults were completed: No Patient Arrived: Ambulatory Added or deleted any medications: No Arrival Time: 11:29 Any new allergies or adverse reactions: No Transfer Assistance: None Had a fall or experienced change in No Patient Requires Transmission-Based No activities of daily living that may affect Precautions: risk of falls: Patient Has Alerts: Yes Signs or symptoms of abuse/neglect since last visito No Patient Alerts: Patient on Blood Hospitalized since last visit: No Thinner Pain Present Now: Yes Electronic Signature(s) Unsigned Entered ByMassie Mccarthy on 01/11/2022 11:35:51 Signature(s): Date(s): [name] ([id number]) -------------------------------------------------------------------------------- Pain Assessment Details Patient Name: [name], [name]. Date of Service: 01/11/2022 11:15 AM Medical Record Number: [id number] Patient Account Number: [id number] Date of Birth/Sex: 09-21-1950 (71 y.o. F) Treating RN: [name] Primary Care [name]: [name] Other Clinician: [name] Referring [name]: [name] Treating [name]/Extender: [name] in Treatment: 6 Active Problems Location of Pain Severity and Description of Pain Patient Has Paino Yes Site Locations Pain Location: Generalized Pain, Pain in Ulcers Duration of the Pain. Constant / Intermittento Intermittent Rate the pain. Current Pain Level: 3 Character of  Pain Describe the Pain: Burning, Throbbing, Other: stings, pressure in right foot Pain Management and Medication Current Pain Management: Medication: Yes Rest: Yes Electronic Signature(s) Unsigned Entered By: [name] on 01/11/2022 11:39:24 Signature(s): Date(s): [name] ([id number]) -------------------------------------------------------------------------------- Vitals Details Patient Name: [name]. Date of Service: 01/11/2022 11:15 AM Medical Record Number: [id number] Patient Account Number: [id number] Date of Birth/Sex: [birth year] (71 y.o. F) Treating RN: [name] Primary Care Kiaira Pointer: [name] Other Clinician: [name] Referring [name]: [name] Treating [name]/Extender: [name] in Treatment: 6 Vital Signs Time Taken: 11:37 Temperature (F): 98.3 Height (in): 63 Pulse (bpm): 87 Weight (lbs): 147 Respiratory Rate (breaths/min): 18 Body Mass Index (BMI): 26 Blood Pressure (mmHg): 139/85 Reference Range: 80 - 120 mg / dl Electronic Signature(s) Unsigned Entered ByMassie Mccarthy on 01/11/2022 11:39:19 Signature(s): Date(s):

## 2022-01-11 NOTE — Progress Notes (Addendum)
HIYA, POINT ([id number]) Visit Report for 01/11/2022 Chief Complaint Document Details Patient Name: [name], [name]. Date of Service: 01/11/2022 11:15 AM Medical Record Number: [id number] Patient Account Number: [id number] Date of Birth/Sex: 04-07-1951 (71 y.o. F) Treating RN: [name] Primary Care Provider: Cher Mccarthy Other Clinician: [name] Referring Provider: Cher Mccarthy Treating Provider/Extender: [name] in Treatment: 6 Information Obtained from: Patient Chief Complaint Right LE Ulcers Electronic Signature(s) Signed: 01/11/2022 11:19:11 AM By: Brandy Keeler PA-C Entered By: [name] on 01/11/2022 11:19:11 [name], [name] ([id number]) -------------------------------------------------------------------------------- HPI Details Patient Name: [name] Date of Service: 01/11/2022 11:15 AM Medical Record Number: [id number] Patient Account Number: [id number] Date of Birth/Sex: 07-Mar-1951 (71 y.o. F) Treating RN: [name] Primary Care Provider: Cher Mccarthy Other Clinician: [name] Referring Provider: Cher Mccarthy Treating Provider/Extender: [name] in Treatment: 6 History of Present Illness HPI Description: 11-30-2021 upon evaluation today patient presents for initial inspection here in our clinic concerning wounds that she has over the right lateral leg as well as the right medial leg. Subsequently the patient is dealing with lower extremity edema which I do believe has been one of the big causes of what is going on here. She also does have what appears to be cellulitis of the leg as well as peripheral vascular disease. She already has amputation of a toe on the right foot. With that being said I did review that surgical pathology from the amputation this was the right third toe which showed a gangrenous ulcer with underlying cellulitis negative for acute osteomyelitis but acute cellulitis was present in the proximal margin. There  was also noted on this pathology report a biopsy from the right leg which showed a gangrenous ulcer with underlying cellulitis and foreign body type giant cell reaction. That was performed on 11-11-2021 and resulted on 11-15-2021 She is also had lower extremity arterial duplex studies which did show that she had falsely elevated ABI on the right with a TBI of 0.45. On the left she had an ABI of 1.05 with a TBI of 0.12. She is pretty much monophasic throughout. With that being said there was definite reason for concern with regard to her blood flow into the legs she does have a follow-up with the vascular surgeon coming up shortly as well. 12-14-2021 upon evaluation today patient appears to be doing decently well currently in regard to her wound. We are definitely seeing some signs of improvement there is definitely some necrotic tissue as well but try to clean some this away carefully today. Fortunately I think that she is going to tolerate the debridement without complication she otherwise seems to be healing excellent. I think get a lot of the dead tissue clearway will make a big difference as far as her healing is concerned. 12-21-2021 upon evaluation today patient appears to be doing well currently in regard to her legs she has been using the Hibiclens which she tells me seems to be helping. She tells me the leg feels much better washing with this compared to what it was previous which is great news. Fortunately I do not see any signs of active infection and I am overall extremely pleased with where we stand. I do think we are on the right track here. 01-04-2022 upon evaluation today patient appears to be doing unfortunately somewhat worse in regard to the new pustule areas that are appearing on her lower extremity. Again I have discussed with her based on what I am  seeing that though she does have a wound I do not feel like these new pustule areas are related to the wound in particular. Again I am  unsure of exactly what is going on and I really think she would benefit from seeing a dermatology specialist. She voiced understanding and is not opposed to this she did see a dermatologist in [place] but they were under the impression they were just to do a biopsy which she had already had and therefore really did not do much she tells me that so I really know about that encounter. With that being said in general I do feel like that the patient is doing better although that is more in relation to the wound I think these new pustule areas still have me concerned. I do think she could be a candidate for topical antibiotics such as [employer] but at the same time I do think that we need to continue to monitor for any signs of overall worsening infection in the interim. 01-11-2022 upon evaluation today patient appears to be doing well currently in regard to her wounds nothing seems to be doing worse although she continues to have issues with multiple pustules forming I am really not certain that this is actually infection. I am beginning to suspect something rheumatologic/autoimmune. With that being said she did see dermatology today and it was felt by the dermatologist that this is likely infectious despite the fact we had a negative culture from when I saw her last week. They did repeat a culture on one of the pustules. Electronic Signature(s) Signed: 01/11/2022 1:16:29 PM By: Brandy Keeler PA-C Entered By: [name] on 01/11/2022 13:16:29 [name], [name] ([id number]) -------------------------------------------------------------------------------- Physical Exam Details Patient Name: [name]. Date of Service: 01/11/2022 11:15 AM Medical Record Number: [id number] Patient Account Number: [id number] Date of Birth/Sex: 08-Aug-1950 (71 y.o. F) Treating RN: [name] Primary Care Provider: Cher Mccarthy Other Clinician: [name] Referring Provider: Cher Mccarthy Treating Provider/Extender:  [name] in Treatment: 6 Constitutional Well-nourished and well-hydrated in no acute distress. Respiratory normal breathing without difficulty. Psychiatric this patient is able to make decisions and demonstrates good insight into disease process. Alert and Oriented x 3. pleasant and cooperative. Notes Upon inspection patient's wound bed actually showed signs of having multiple pustular areas of the right lower extremity specifically in regard to the shin, ankle, and foot. With that being said I did not perform any debridement today as I am unsure of exactly what is going on I do not want to exacerbate or worsen anything. She does have a pending culture with dermatology Electronic Signature(s) Signed: 01/11/2022 1:16:57 PM By: Brandy Keeler PA-C Entered By: [name] on 01/11/2022 13:16:56 [place], [name] ([id number]) -------------------------------------------------------------------------------- Physician Orders Details Patient Name: [name]. Date of Service: 01/11/2022 11:15 AM Medical Record Number: [id number] Patient Account Number: [id number] Date of Birth/Sex: June 13, 1950 (71 y.o. F) Treating RN: [name] Primary Care Provider: Cher Mccarthy Other Clinician: [name] Referring Provider: Cher Mccarthy Treating Provider/Extender: [name] in Treatment: 6 Verbal / Phone Orders: No Diagnosis Coding ICD-10 Coding Code Description I87.331 Chronic venous hypertension (idiopathic) with ulcer and inflammation of right lower extremity L97.812 Non-pressure chronic ulcer of other part of right lower leg with fat layer exposed L03.115 Cellulitis of right lower limb I73.89 Other specified peripheral vascular diseases 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 Bathing/ Shower/ Hygiene o May shower; gently  cleanse wound with antibacterial soap, rinse and pat dry prior to dressing wounds -  Hibiclens recommended for washing right lower leg prior to dressing changes. You may pick this up at a pharmacy or purchase on [place] o No tub bath. Edema Control - Lymphedema / Segmental Compressive Device / Other o Tubigrip single layer applied. - size D o Elevate, Exercise Daily and Avoid Standing for Long Periods of Time. o Elevate legs to the level of the heart and pump ankles as often as possible o Elevate leg(s) parallel to the floor when sitting. Wound Treatment Wound #1 - Lower Leg Wound Laterality: Right, Medial Cleanser: Hibiclens, 16 (oz) 3 x Per Week/30 Days Discharge Instructions: Put Hibiclenso on your skin and rub it in gently for five minutes with a washcloth. Turn the water back on and rinse very well with warm water. Do not use your regular soap after using and rinsing Hibiclenso. Pat yourself dry with a clean towel. Primary Dressing: Silvercel 4 1/4x 4 1/4 (in/in) 3 x Per Week/30 Days Discharge Instructions: Apply Silvercel 4 1/4x 4 1/4 (in/in) as instructed Secondary Dressing: ABD Pad 5x9 (in/in) 3 x Per Week/30 Days Discharge Instructions: Cover with ABD pad Secured With: Medipore Tape - 544M Medipore H Soft Cloth Surgical Tape, 2x2 (in/yd) 3 x Per Week/30 Days Secured With: Conform 4'' - Conforming Stretch Gauze Bandage 4x75 (in/in) 3 x Per Week/30 Days Discharge Instructions: Apply as directed Secured With: Tubigrip Size D, 3x10 (in/yd) 3 x Per Week/30 Days Wound #2 - Lower Leg Wound Laterality: Right, Lateral Cleanser: Hibiclens, 16 (oz) 3 x Per Week/30 Days Discharge Instructions: Put Hibiclenso on your skin and rub it in gently for five minutes with a washcloth. Turn the water back on and rinse very well with warm water. Do not use your regular soap after using and rinsing Hibiclenso. Pat yourself dry with a clean towel. Primary Dressing: Silvercel 4 1/4x 4 1/4 (in/in) 3 x Per Week/30 Days Discharge Instructions: Apply Silvercel 4 1/4x 4 1/4 (in/in) as  instructed Secondary Dressing: ABD Pad 5x9 (in/in) 3 x Per Week/30 Days Discharge Instructions: Cover with ABD pad Secured With: Medipore Tape - 544M Medipore H Soft Cloth Surgical Tape, 2x2 (in/yd) 3 x Per Week/30 Days [name], [name] ([id number]) Secured With: Conform 4'' - Conforming Stretch Gauze Bandage 4x75 (in/in) 3 x Per Week/30 Days Discharge Instructions: Apply as directed Secured With: Tubigrip Size D, 3x10 (in/yd) 3 x Per Week/30 Days Wound #3 - Foot Wound Laterality: Plantar, Right Cleanser: Hibiclens, 16 (oz) 3 x Per Week/30 Days Discharge Instructions: Put Hibiclenso on your skin and rub it in gently for five minutes with a washcloth. Turn the water back on and rinse very well with warm water. Do not use your regular soap after using and rinsing Hibiclenso. Pat yourself dry with a clean towel. Primary Dressing: Silvercel 4 1/4x 4 1/4 (in/in) 3 x Per Week/30 Days Discharge Instructions: Apply Silvercel 4 1/4x 4 1/4 (in/in) as instructed Secondary Dressing: ABD Pad 5x9 (in/in) 3 x Per Week/30 Days Discharge Instructions: Cover with ABD pad Secured With: Medipore Tape - 544M Medipore H Soft Cloth Surgical Tape, 2x2 (in/yd) 3 x Per Week/30 Days Secured With: Conform 4'' - Conforming Stretch Gauze Bandage 4x75 (in/in) 3 x Per Week/30 Days Discharge Instructions: Apply as directed Secured With: Tubigrip Size D, 3x10 (in/yd) 3 x Per Week/30 Days Electronic Signature(s) Signed: 01/11/2022 2:41:05 PM By: [name] Signed: 01/11/2022 3:58:52 PM By: Brandy Keeler PA-C Entered By:  [name] on 01/11/2022 12:12:50 [place], [name] ([id number]) -------------------------------------------------------------------------------- Problem List Details Patient Name: MALEKA, CONTINO. Date of Service: 01/11/2022 11:15 AM Medical Record Number: [id number] Patient Account Number: [id number] Date of Birth/Sex: 1951-02-01 (71 y.o. F) Treating RN: [name] Primary Care Provider: Cher Mccarthy Other Clinician: [name] Referring Provider: Cher Mccarthy Treating Provider/Extender: [name] in Treatment: 6 Active Problems ICD-10 Encounter Code Description Active Date MDM Diagnosis I87.331 Chronic venous hypertension (idiopathic) with ulcer and inflammation of 11/30/2021 No Yes right lower extremity L97.812 Non-pressure chronic ulcer of other part of right lower leg with fat layer 11/30/2021 No Yes exposed L03.115 Cellulitis of right lower limb 11/30/2021 No Yes I73.89 Other specified peripheral vascular diseases 11/30/2021 No Yes Z89.421 Acquired absence of other right toe(s) 11/30/2021 No Yes Inactive Problems Resolved Problems Electronic Signature(s) Signed: 01/11/2022 11:19:07 AM By: Brandy Keeler PA-C Entered By: [name] on 01/11/2022 11:19:07 [place], [name] ([id number]) -------------------------------------------------------------------------------- Progress Note Details Patient Name: [name]. Date of Service: 01/11/2022 11:15 AM Medical Record Number: [id number] Patient Account Number: [id number] Date of Birth/Sex: 11-11-1950 (71 y.o. F) Treating RN: [name] Primary Care Provider: Cher Mccarthy Other Clinician: [name] Referring Provider: Cher Mccarthy Treating Provider/Extender: [name] in Treatment: 6 Subjective Chief Complaint Information obtained from Patient Right LE Ulcers History of Present Illness (HPI) 11-30-2021 upon evaluation today patient presents for initial inspection here in our clinic concerning wounds that she has over the right lateral leg as well as the right medial leg. Subsequently the patient is dealing with lower extremity edema which I do believe has been one of the big causes of what is going on here. She also does have what appears to be cellulitis of the leg as well as peripheral vascular disease. She already has amputation of a toe on the right foot. With that being said I did review that  surgical pathology from the amputation this was the right third toe which showed a gangrenous ulcer with underlying cellulitis negative for acute osteomyelitis but acute cellulitis was present in the proximal margin. There was also noted on this pathology report a biopsy from the right leg which showed a gangrenous ulcer with underlying cellulitis and foreign body type giant cell reaction. That was performed on 11-11-2021 and resulted on 11-15-2021 She is also had lower extremity arterial duplex studies which did show that she had falsely elevated ABI on the right with a TBI of 0.45. On the left she had an ABI of 1.05 with a TBI of 0.12. She is pretty much monophasic throughout. With that being said there was definite reason for concern with regard to her blood flow into the legs she does have a follow-up with the vascular surgeon coming up shortly as well. 12-14-2021 upon evaluation today patient appears to be doing decently well currently in regard to her wound. We are definitely seeing some signs of improvement there is definitely some necrotic tissue as well but try to clean some this away carefully today. Fortunately I think that she is going to tolerate the debridement without complication she otherwise seems to be healing excellent. I think get a lot of the dead tissue clearway will make a big difference as far as her healing is concerned. 12-21-2021 upon evaluation today patient appears to be doing well currently in regard to her legs she has been using the Hibiclens which she tells me seems to be helping. She tells me the leg feels much better washing  with this compared to what it was previous which is great news. Fortunately I do not see any signs of active infection and I am overall extremely pleased with where we stand. I do think we are on the right track here. 01-04-2022 upon evaluation today patient appears to be doing unfortunately somewhat worse in regard to the new pustule areas that are  appearing on her lower extremity. Again I have discussed with her based on what I am seeing that though she does have a wound I do not feel like these new pustule areas are related to the wound in particular. Again I am unsure of exactly what is going on and I really think she would benefit from seeing a dermatology specialist. She voiced understanding and is not opposed to this she did see a dermatologist in [place] but they were under the impression they were just to do a biopsy which she had already had and therefore really did not do much she tells me that so I really know about that encounter. With that being said in general I do feel like that the patient is doing better although that is more in relation to the wound I think these new pustule areas still have me concerned. I do think she could be a candidate for topical antibiotics such as [employer] but at the same time I do think that we need to continue to monitor for any signs of overall worsening infection in the interim. 01-11-2022 upon evaluation today patient appears to be doing well currently in regard to her wounds nothing seems to be doing worse although she continues to have issues with multiple pustules forming I am really not certain that this is actually infection. I am beginning to suspect something rheumatologic/autoimmune. With that being said she did see dermatology today and it was felt by the dermatologist that this is likely infectious despite the fact we had a negative culture from when I saw her last week. They did repeat a culture on one of the pustules. Objective Constitutional Well-nourished and well-hydrated in no acute distress. Vitals Time Taken: 11:37 AM, Height: 63 in, Weight: 147 lbs, BMI: 26, Temperature: 98.3 F, Pulse: 87 bpm, Respiratory Rate: 18 breaths/min, Blood Pressure: 139/85 mmHg. Respiratory normal breathing without difficulty. Psychiatric [name], [name] ([id number]) this patient is able to  make decisions and demonstrates good insight into disease process. Alert and Oriented x 3. pleasant and cooperative. General Notes: Upon inspection patient's wound bed actually showed signs of having multiple pustular areas of the right lower extremity specifically in regard to the shin, ankle, and foot. With that being said I did not perform any debridement today as I am unsure of exactly what is going on I do not want to exacerbate or worsen anything. She does have a pending culture with dermatology Integumentary (Hair, Skin) Wound #1 status is Open. Original cause of wound was Gradually Appeared. The date acquired was: 07/07/2021. The wound has been in treatment 6 weeks. The wound is located on the Right,Medial Lower Leg. The wound measures 2cm length x 1cm width x 0.1cm depth; 1.571cm^2 area and 0.157cm^3 volume. There is Fat Layer (Subcutaneous Tissue) exposed. There is a medium amount of serosanguineous drainage noted. There is small (1-33%) pink granulation within the wound bed. There is a large (67-100%) amount of necrotic tissue within the wound bed including Adherent Slough. Wound #2 status is Open. Original cause of wound was Gradually Appeared. The date acquired was: 07/08/2021. The wound has been in  treatment 6 weeks. The wound is located on the Right,Lateral Lower Leg. The wound measures 5.9cm length x 1.5cm width x 0.4cm depth; 6.951cm^2 area and 2.78cm^3 volume. There is Fat Layer (Subcutaneous Tissue) exposed. There is a medium amount of serosanguineous drainage noted. There is large (67-100%) red granulation within the wound bed. There is a small (1-33%) amount of necrotic tissue within the wound bed including Adherent Slough. Wound #3 status is Open. Original cause of wound was Bump. The date acquired was: 12/22/2021. The wound has been in treatment 1 weeks. The wound is located on the [place]. The wound measures 0.5cm length x 1cm width x 0.1cm depth; 0.393cm^2 area and  0.039cm^3 volume. There is a medium amount of serosanguineous drainage noted. There is no granulation within the wound bed. There is a large (67-100%) amount of necrotic tissue within the wound bed including Adherent Slough. Assessment Active Problems ICD-10 Chronic venous hypertension (idiopathic) with ulcer and inflammation of right lower extremity Non-pressure chronic ulcer of other part of right lower leg with fat layer exposed Cellulitis of right lower limb Other specified peripheral vascular diseases Acquired absence of other right toe(s) Plan Follow-up Appointments: Return Appointment in 1 week. Bathing/ Shower/ Hygiene: May shower; gently cleanse wound with antibacterial soap, rinse and pat dry prior to dressing wounds - Hibiclens recommended for washing right lower leg prior to dressing changes. You may pick this up at a pharmacy or purchase on [place] No tub bath. Edema Control - Lymphedema / Segmental Compressive Device / Other: Tubigrip single layer applied. - size D Elevate, Exercise Daily and Avoid Standing for Long Periods of Time. Elevate legs to the level of the heart and pump ankles as often as possible Elevate leg(s) parallel to the floor when sitting. WOUND #1: - Lower Leg Wound Laterality: Right, Medial Cleanser: Hibiclens, 16 (oz) 3 x Per Week/30 Days Discharge Instructions: Put Hibiclens on your skin and rub it in gently for five minutes with a washcloth. Turn the water back on and rinse very well with warm water. Do not use your regular soap after using and rinsing Hibiclens. Pat yourself dry with a clean towel. Primary Dressing: Silvercel 4 1/4x 4 1/4 (in/in) 3 x Per Week/30 Days Discharge Instructions: Apply Silvercel 4 1/4x 4 1/4 (in/in) as instructed Secondary Dressing: ABD Pad 5x9 (in/in) 3 x Per Week/30 Days Discharge Instructions: Cover with ABD pad Secured With: Medipore Tape - 38M Medipore H Soft Cloth Surgical Tape, 2x2 (in/yd) 3 x Per Week/30  Days Secured With: Conform 4'' - Conforming Stretch Gauze Bandage 4x75 (in/in) 3 x Per Week/30 Days Discharge Instructions: Apply as directed Secured With: Tubigrip Size D, 3x10 (in/yd) 3 x Per Week/30 Days WOUND #2: - Lower Leg Wound Laterality: Right, Lateral Cleanser: Hibiclens, 16 (oz) 3 x Per Week/30 Days Discharge Instructions: Put Hibiclens on your skin and rub it in gently for five minutes with a washcloth. Turn the water back on and rinse very well with warm water. Do not use your regular soap after using and rinsing Hibiclens. Pat yourself dry with a clean towel. Primary Dressing: Silvercel 4 1/4x 4 1/4 (in/in) 3 x Per Week/30 Days Discharge Instructions: Apply Silvercel 4 1/4x 4 1/4 (in/in) as instructed Secondary Dressing: ABD Pad 5x9 (in/in) 3 x Per Week/30 Days Discharge Instructions: Cover with ABD pad [place], [name] ([id number]) Secured With: Medipore Tape - 38M Medipore H Soft Cloth Surgical Tape, 2x2 (in/yd) 3 x Per Week/30 Days Secured With: Conform 4'' - Conforming Stretch  Gauze Bandage 4x75 (in/in) 3 x Per Week/30 Days Discharge Instructions: Apply as directed Secured With: Tubigrip Size D, 3x10 (in/yd) 3 x Per Week/30 Days WOUND #3: - Foot Wound Laterality: Plantar, Right Cleanser: Hibiclens, 16 (oz) 3 x Per Week/30 Days Discharge Instructions: Put Hibiclens on your skin and rub it in gently for five minutes with a washcloth. Turn the water back on and rinse very well with warm water. Do not use your regular soap after using and rinsing Hibiclens. Pat yourself dry with a clean towel. Primary Dressing: Silvercel 4 1/4x 4 1/4 (in/in) 3 x Per Week/30 Days Discharge Instructions: Apply Silvercel 4 1/4x 4 1/4 (in/in) as instructed Secondary Dressing: ABD Pad 5x9 (in/in) 3 x Per Week/30 Days Discharge Instructions: Cover with ABD pad Secured With: Medipore Tape - 75M Medipore H Soft Cloth Surgical Tape, 2x2 (in/yd) 3 x Per Week/30 Days Secured With: Conform 4'' -  Conforming Stretch Gauze Bandage 4x75 (in/in) 3 x Per Week/30 Days Discharge Instructions: Apply as directed Secured With: Tubigrip Size D, 3x10 (in/yd) 3 x Per Week/30 Days 1. Depending on what is seen on the culture from dermatology we may want to initiate treatment with a referral to rheumatology to further evaluate situation I am beginning to suspect this could be something autoimmune although we will see what the culture shows and what the treatment plan is from dermatology in that regard. 2. I am also can recommend that we have the patient continue with the silver cell followed by ABD pads to cover and this is after washing with the Hibiclens. 3. I am also can recommend the patient should continue with the Tubigrip size D which is helping with edema control. We will see patient back for reevaluation in 1 week here in the clinic. If anything worsens or changes patient will contact our office for additional recommendations. Electronic Signature(s) Signed: 01/11/2022 1:17:36 PM By: Brandy Keeler PA-C Entered By: [name] on 01/11/2022 13:17:35 [name], [name] ([id number]) -------------------------------------------------------------------------------- SuperBill Details Patient Name: [name]. Date of Service: 01/11/2022 Medical Record Number: [id number] Patient Account Number: [id number] Date of Birth/Sex: August 07, 1950 (71 y.o. F) Treating RN: [name] Primary Care Provider: Cher Mccarthy Other Clinician: [name] Referring Provider: Cher Mccarthy Treating Provider/Extender: [name] in Treatment: 6 Diagnosis Coding ICD-10 Codes Code Description [phone] Chronic venous hypertension (idiopathic) with ulcer and inflammation of right lower extremity L97.812 Non-pressure chronic ulcer of other part of right lower leg with fat layer exposed L03.115 Cellulitis of right lower limb I73.89 Other specified peripheral vascular diseases Z89.421 Acquired absence of other  right toe(s) Facility Procedures CPT4 Code: [id number] Description: 99214 - WOUND CARE VISIT-LEV 4 EST PT Modifier: Quantity: 1 Physician Procedures CPT4 Code Description: [id number] 99213 - WC PHYS LEVEL 3 - EST PT Modifier: Quantity: 1 CPT4 Code Description: ICD-10 Diagnosis Description I87.331 Chronic venous hypertension (idiopathic) with ulcer and inflammation of L97.812 Non-pressure chronic ulcer of other part of right lower leg with fat la L03.115 Cellulitis of right lower limb  I73.89 Other specified peripheral vascular diseases Modifier: right lower extremit yer exposed Quantity: y Engineer, maintenance) Signed: 01/11/2022 1:17:49 PM By: Brandy Keeler PA-C Entered By: [name] on 01/11/2022 13:17:48

## 2022-01-12 DIAGNOSIS — L259 Unspecified contact dermatitis, unspecified cause: Secondary | ICD-10-CM | POA: Diagnosis not present

## 2022-01-12 DIAGNOSIS — M6281 Muscle weakness (generalized): Secondary | ICD-10-CM | POA: Diagnosis not present

## 2022-01-12 DIAGNOSIS — L089 Local infection of the skin and subcutaneous tissue, unspecified: Secondary | ICD-10-CM | POA: Diagnosis not present

## 2022-01-12 DIAGNOSIS — M79671 Pain in right foot: Secondary | ICD-10-CM | POA: Diagnosis not present

## 2022-01-12 DIAGNOSIS — L88 Pyoderma gangrenosum: Secondary | ICD-10-CM | POA: Diagnosis not present

## 2022-01-12 DIAGNOSIS — R2689 Other abnormalities of gait and mobility: Secondary | ICD-10-CM | POA: Diagnosis not present

## 2022-01-12 DIAGNOSIS — M25674 Stiffness of right foot, not elsewhere classified: Secondary | ICD-10-CM | POA: Diagnosis not present

## 2022-01-24 DIAGNOSIS — R519 Headache, unspecified: Secondary | ICD-10-CM | POA: Diagnosis not present

## 2022-01-24 DIAGNOSIS — R5382 Chronic fatigue, unspecified: Secondary | ICD-10-CM | POA: Diagnosis not present

## 2022-01-24 DIAGNOSIS — K219 Gastro-esophageal reflux disease without esophagitis: Secondary | ICD-10-CM | POA: Diagnosis not present

## 2022-01-24 DIAGNOSIS — G4700 Insomnia, unspecified: Secondary | ICD-10-CM | POA: Diagnosis not present

## 2022-01-24 DIAGNOSIS — L02611 Cutaneous abscess of right foot: Secondary | ICD-10-CM | POA: Diagnosis not present

## 2022-01-24 DIAGNOSIS — J309 Allergic rhinitis, unspecified: Secondary | ICD-10-CM | POA: Diagnosis not present

## 2022-01-24 DIAGNOSIS — E559 Vitamin D deficiency, unspecified: Secondary | ICD-10-CM | POA: Diagnosis not present

## 2022-01-24 DIAGNOSIS — I10 Essential (primary) hypertension: Secondary | ICD-10-CM | POA: Diagnosis not present

## 2022-01-24 DIAGNOSIS — E785 Hyperlipidemia, unspecified: Secondary | ICD-10-CM | POA: Diagnosis not present

## 2022-01-24 DIAGNOSIS — R7303 Prediabetes: Secondary | ICD-10-CM | POA: Diagnosis not present

## 2022-01-24 DIAGNOSIS — M1990 Unspecified osteoarthritis, unspecified site: Secondary | ICD-10-CM | POA: Diagnosis not present

## 2022-01-24 DIAGNOSIS — I739 Peripheral vascular disease, unspecified: Secondary | ICD-10-CM | POA: Diagnosis not present

## 2022-01-24 DIAGNOSIS — R600 Localized edema: Secondary | ICD-10-CM | POA: Diagnosis not present

## 2022-01-25 ENCOUNTER — Encounter: Payer: Medicare HMO | Admitting: Physician Assistant

## 2022-01-25 DIAGNOSIS — L03115 Cellulitis of right lower limb: Secondary | ICD-10-CM | POA: Diagnosis not present

## 2022-01-25 DIAGNOSIS — Z89421 Acquired absence of other right toe(s): Secondary | ICD-10-CM | POA: Diagnosis not present

## 2022-01-25 DIAGNOSIS — S51812A Laceration without foreign body of left forearm, initial encounter: Secondary | ICD-10-CM | POA: Diagnosis not present

## 2022-01-25 DIAGNOSIS — I87331 Chronic venous hypertension (idiopathic) with ulcer and inflammation of right lower extremity: Secondary | ICD-10-CM | POA: Diagnosis not present

## 2022-01-25 DIAGNOSIS — L97812 Non-pressure chronic ulcer of other part of right lower leg with fat layer exposed: Secondary | ICD-10-CM | POA: Diagnosis not present

## 2022-01-25 NOTE — Progress Notes (Addendum)
[name], [name] ([id number]) Visit Report for 01/25/2022 Chief Complaint Document Details Patient Name: Brandy, Mccarthy. Date of Service: 01/25/2022 10:30 AM Medical Record Number: [id number] Patient Account Number: [id number] Date of Birth/Sex: April 05, 1951 (71 y.o. F) Treating RN: Carlene Coria Primary Care Provider: [name] Other Clinician: Massie Kluver Referring Provider: [name] Treating Provider/Extender: Skipper Cliche in Treatment: 8 Information Obtained from: Patient Chief Complaint Right LE Ulcers and left forearm laceration Electronic Signature(s) Signed: 01/25/2022 11:11:04 AM By: Worthy Keeler PA-C Previous Signature: 01/25/2022 10:23:38 AM Version By: Worthy Keeler PA-C Entered By: Worthy Keeler on 01/25/2022 11:11:04 [name], [name] ([id number]) -------------------------------------------------------------------------------- HPI Details Patient Name: [name] Date of Service: 01/25/2022 10:30 AM Medical Record Number: [id number] Patient Account Number: [id number] Date of Birth/Sex: 09-Apr-1951 (71 y.o. F) Treating RN: Carlene Coria Primary Care Provider: [name] Other Clinician: Massie Kluver Referring Provider: [name] Treating Provider/Extender: Skipper Cliche in Treatment: 8 History of Present Illness HPI Description: 11-30-2021 upon evaluation today patient presents for initial inspection here in our clinic concerning wounds that she has over the right lateral leg as well as the right medial leg. Subsequently the patient is dealing with lower extremity edema which I do believe has been one of the big causes of what is going on here. She also does have what appears to be cellulitis of the leg as well as peripheral vascular disease. She already has amputation of a toe on the right foot. With that being said I did review that surgical pathology from the amputation this was the right third toe which showed a gangrenous ulcer with  underlying cellulitis negative for acute osteomyelitis but acute cellulitis was present in the proximal margin. There was also noted on this pathology report a biopsy from the right leg which showed a gangrenous ulcer with underlying cellulitis and foreign body type giant cell reaction. That was performed on 11-11-2021 and resulted on 11-15-2021 She is also had lower extremity arterial duplex studies which did show that she had falsely elevated ABI on the right with a TBI of 0.45. On the left she had an ABI of 1.05 with a TBI of 0.12. She is pretty much monophasic throughout. With that being said there was definite reason for concern with regard to her blood flow into the legs she does have a follow-up with the vascular surgeon coming up shortly as well. 12-14-2021 upon evaluation today patient appears to be doing decently well currently in regard to her wound. We are definitely seeing some signs of improvement there is definitely some necrotic tissue as well but try to clean some this away carefully today. Fortunately I think that she is going to tolerate the debridement without complication she otherwise seems to be healing excellent. I think get a lot of the dead tissue clearway will make a big difference as far as her healing is concerned. 12-21-2021 upon evaluation today patient appears to be doing well currently in regard to her legs she has been using the Hibiclens which she tells me seems to be helping. She tells me the leg feels much better washing with this compared to what it was previous which is great news. Fortunately I do not see any signs of active infection and I am overall extremely pleased with where we stand. I do think we are on the right track here. 01-04-2022 upon evaluation today patient appears to be doing unfortunately somewhat worse in regard to the new pustule areas that are appearing  on her lower extremity. Again I have discussed with her based on what I am seeing that though she  does have a wound I do not feel like these new pustule areas are related to the wound in particular. Again I am unsure of exactly what is going on and I really think she would benefit from seeing a dermatology specialist. She voiced understanding and is not opposed to this she did see a dermatologist in [place] but they were under the impression they were just to do a biopsy which she had already had and therefore really did not do much she tells me that so I really know about that encounter. With that being said in general I do feel like that the patient is doing better although that is more in relation to the wound I think these new pustule areas still have me concerned. I do think she could be a candidate for topical antibiotics such as [employer] but at the same time I do think that we need to continue to monitor for any signs of overall worsening infection in the interim. 01-11-2022 upon evaluation today patient appears to be doing well currently in regard to her wounds nothing seems to be doing worse although she continues to have issues with multiple pustules forming I am really not certain that this is actually infection. I am beginning to suspect something rheumatologic/autoimmune. With that being said she did see dermatology today and it was felt by the dermatologist that this is likely infectious despite the fact we had a negative culture from when I saw her last week. They did repeat a culture on one of the pustules. 01-25-2022 upon evaluation today patient appears to be doing better currently in regard to her wounds in general. Everything is showing signs of improvement which is great news. Fortunately I see no evidence of active infection locally or systemically at this time which is great news. She does have a new skin tear on her forearm but this she did try to pull back over and has done quite well. Electronic Signature(s) Signed: 01/26/2022 8:23:26 AM By: Worthy Keeler PA-C Entered By:  Worthy Keeler on 01/26/2022 08:23:26 [name], [name] ([id number]) -------------------------------------------------------------------------------- Physical Exam Details Patient Name: CHIMENE, SALO. Date of Service: 01/25/2022 10:30 AM Medical Record Number: [id number] Patient Account Number: [id number] Date of Birth/Sex: Dec 12, 1950 (71 y.o. F) Treating RN: Carlene Coria Primary Care Provider: [name] Other Clinician: Massie Kluver Referring Provider: [name] Treating Provider/Extender: Skipper Cliche in Treatment: 8 Constitutional Well-nourished and well-hydrated in no acute distress. Respiratory normal breathing without difficulty. Psychiatric this patient is able to make decisions and demonstrates good insight into disease process. Alert and Oriented x 3. pleasant and cooperative. Notes Patient's wounds did not require sharp debridement today which is great news. Overall I am extremely pleased with where things stand at this point. I think that she is making good progress in regard to the leg and the new skin tear on the forearm is also significantly improved. Electronic Signature(s) Signed: 01/26/2022 8:23:48 AM By: Worthy Keeler PA-C Entered By: Worthy Keeler on 01/26/2022 08:23:48 [name], [name] ([id number]) -------------------------------------------------------------------------------- Physician Orders Details Patient Name: [name]. Date of Service: 01/25/2022 10:30 AM Medical Record Number: [id number] Patient Account Number: [id number] Date of Birth/Sex: 01-12-1951 (71 y.o. F) Treating RN: Carlene Coria Primary Care Provider: [name] Other Clinician: Massie Kluver Referring Provider: [name] Treating Provider/Extender: Skipper Cliche in Treatment: 8 Verbal / Phone Orders:  No Diagnosis Coding ICD-10 Coding Code Description I87.331 Chronic venous hypertension (idiopathic) with ulcer and inflammation of right lower extremity L97.812  Non-pressure chronic ulcer of other part of right lower leg with fat layer exposed L03.115 Cellulitis of right lower limb S51.812A Laceration without foreign body of left forearm, initial encounter I73.89 Other specified peripheral vascular diseases 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 Bathing/ Shower/ Hygiene o May shower; gently cleanse wound with antibacterial soap, rinse and pat dry prior to dressing wounds - Hibiclens recommended for washing right lower leg prior to dressing changes. You may pick this up at a pharmacy or purchase on [place] o No tub bath. Edema Control - Lymphedema / Segmental Compressive Device / Other o Tubigrip double layer applied - Tubi D double layer o Elevate, Exercise Daily and Avoid Standing for Long Periods of Time. o Elevate legs to the level of the heart and pump ankles as often as possible o Elevate leg(s) parallel to the floor when sitting. Wound Treatment Wound #2 - Lower Leg Wound Laterality: Right, Lateral Cleanser: Hibiclens, 16 (oz) 3 x Per Week/30 Days Discharge Instructions: Put Hibiclenso on your skin and rub it in gently for five minutes with a washcloth. Turn the water back on and rinse very well with warm water. Do not use your regular soap after using and rinsing Hibiclenso. Pat yourself dry with a clean towel. Primary Dressing: Silvercel 4 1/4x 4 1/4 (in/in) 3 x Per Week/30 Days Discharge Instructions: Apply Silvercel 4 1/4x 4 1/4 (in/in) as instructed Secondary Dressing: ABD Pad 5x9 (in/in) 3 x Per Week/30 Days Discharge Instructions: Cover with ABD pad Secured With: Medipore Tape - 13M Medipore H Soft Cloth Surgical Tape, 2x2 (in/yd) 3 x Per Week/30 Days Secured With: Conform 4'' - Conforming Stretch Gauze Bandage 4x75 (in/in) 3 x Per Week/30 Days Discharge Instructions: Apply as directed Secured With: Tubigrip Size D, 3x10 (in/yd) 3 x Per Week/30 Days Wound #4  - Forearm Wound Laterality: Left Cleanser: Normal Saline Discharge Instructions: Wash your hands with soap and water. Remove old dressing, discard into plastic bag and place into trash. Cleanse the wound with Normal Saline prior to applying a clean dressing using gauze sponges, not tissues or cotton balls. Do not scrub or use excessive force. Pat dry using gauze sponges, not tissue or cotton balls. Cleanser: Soap and Water Discharge Instructions: Gently cleanse wound with antibacterial soap, rinse and pat dry prior to dressing wounds Primary Dressing: Xeroform-HBD 2x2 (in/in) Discharge Instructions: Apply Xeroform-HBD 2x2 (in/in) as directed [name], [name] ([id number]) Secondary Dressing: (BORDER) Zetuvit Plus SILICONE BORDER Dressing 4x4 (in/in) Discharge Instructions: Please do not put silicone bordered dressings under wraps. Use non-bordered dressing only. Consults o Rheumatology - Refer to Rheumatology Dr. Posey Pronto Electronic Signature(s) Signed: 01/25/2022 12:46:44 PM By: Massie Kluver Signed: 01/25/2022 6:22:21 PM By: Worthy Keeler PA-C Entered By: Massie Kluver on 01/25/2022 11:44:22 [name], [name] ([id number]) -------------------------------------------------------------------------------- Problem List Details Patient Name: Brandy, Mccarthy. Date of Service: 01/25/2022 10:30 AM Medical Record Number: [id number] Patient Account Number: [id number] Date of Birth/Sex: 30-Sep-1950 (71 y.o. F) Treating RN: Carlene Coria Primary Care Provider: [name] Other Clinician: Massie Kluver Referring Provider: [name] Treating Provider/Extender: Skipper Cliche in Treatment: 8 Active Problems ICD-10 Encounter Code Description Active Date MDM Diagnosis I87.331 Chronic venous hypertension (idiopathic) with ulcer and inflammation of 11/30/2021 No Yes right lower extremity L97.812 Non-pressure chronic ulcer of other part of right lower leg with  fat layer 11/30/2021 No  Yes exposed L03.115 Cellulitis of right lower limb 11/30/2021 No Yes S51.812A Laceration without foreign body of left forearm, initial encounter 01/25/2022 No Yes I73.89 Other specified peripheral vascular diseases 11/30/2021 No Yes Z89.421 Acquired absence of other right toe(s) 11/30/2021 No Yes Inactive Problems Resolved Problems Electronic Signature(s) Signed: 01/25/2022 11:10:51 AM By: Worthy Keeler PA-C Previous Signature: 01/25/2022 10:23:33 AM Version By: Worthy Keeler PA-C Entered By: Worthy Keeler on 01/25/2022 11:10:51 [name], [name] ([id number]) -------------------------------------------------------------------------------- Progress Note Details Patient Name: [name]. Date of Service: 01/25/2022 10:30 AM Medical Record Number: [id number] Patient Account Number: [id number] Date of Birth/Sex: 1950/10/16 (71 y.o. F) Treating RN: Carlene Coria Primary Care Provider: [name] Other Clinician: Massie Kluver Referring Provider: [name] Treating Provider/Extender: Skipper Cliche in Treatment: 8 Subjective Chief Complaint Information obtained from Patient Right LE Ulcers and left forearm laceration History of Present Illness (HPI) 11-30-2021 upon evaluation today patient presents for initial inspection here in our clinic concerning wounds that she has over the right lateral leg as well as the right medial leg. Subsequently the patient is dealing with lower extremity edema which I do believe has been one of the big causes of what is going on here. She also does have what appears to be cellulitis of the leg as well as peripheral vascular disease. She already has amputation of a toe on the right foot. With that being said I did review that surgical pathology from the amputation this was the right third toe which showed a gangrenous ulcer with underlying cellulitis negative for acute osteomyelitis but acute cellulitis was present in the proximal margin. There was  also noted on this pathology report a biopsy from the right leg which showed a gangrenous ulcer with underlying cellulitis and foreign body type giant cell reaction. That was performed on 11-11-2021 and resulted on 11-15-2021 She is also had lower extremity arterial duplex studies which did show that she had falsely elevated ABI on the right with a TBI of 0.45. On the left she had an ABI of 1.05 with a TBI of 0.12. She is pretty much monophasic throughout. With that being said there was definite reason for concern with regard to her blood flow into the legs she does have a follow-up with the vascular surgeon coming up shortly as well. 12-14-2021 upon evaluation today patient appears to be doing decently well currently in regard to her wound. We are definitely seeing some signs of improvement there is definitely some necrotic tissue as well but try to clean some this away carefully today. Fortunately I think that she is going to tolerate the debridement without complication she otherwise seems to be healing excellent. I think get a lot of the dead tissue clearway will make a big difference as far as her healing is concerned. 12-21-2021 upon evaluation today patient appears to be doing well currently in regard to her legs she has been using the Hibiclens which she tells me seems to be helping. She tells me the leg feels much better washing with this compared to what it was previous which is great news. Fortunately I do not see any signs of active infection and I am overall extremely pleased with where we stand. I do think we are on the right track here. 01-04-2022 upon evaluation today patient appears to be doing unfortunately somewhat worse in regard to the new pustule areas that are appearing on her lower extremity. Again I have discussed with her  based on what I am seeing that though she does have a wound I do not feel like these new pustule areas are related to the wound in particular. Again I am unsure of  exactly what is going on and I really think she would benefit from seeing a dermatology specialist. She voiced understanding and is not opposed to this she did see a dermatologist in [place] but they were under the impression they were just to do a biopsy which she had already had and therefore really did not do much she tells me that so I really know about that encounter. With that being said in general I do feel like that the patient is doing better although that is more in relation to the wound I think these new pustule areas still have me concerned. I do think she could be a candidate for topical antibiotics such as [employer] but at the same time I do think that we need to continue to monitor for any signs of overall worsening infection in the interim. 01-11-2022 upon evaluation today patient appears to be doing well currently in regard to her wounds nothing seems to be doing worse although she continues to have issues with multiple pustules forming I am really not certain that this is actually infection. I am beginning to suspect something rheumatologic/autoimmune. With that being said she did see dermatology today and it was felt by the dermatologist that this is likely infectious despite the fact we had a negative culture from when I saw her last week. They did repeat a culture on one of the pustules. 01-25-2022 upon evaluation today patient appears to be doing better currently in regard to her wounds in general. Everything is showing signs of improvement which is great news. Fortunately I see no evidence of active infection locally or systemically at this time which is great news. She does have a new skin tear on her forearm but this she did try to pull back over and has done quite well. Objective Constitutional Well-nourished and well-hydrated in no acute distress. Vitals Time Taken: 10:47 AM, Height: 63 in, Weight: 147 lbs, BMI: 26, Temperature: 98.5 F, Pulse: 93 bpm, Respiratory Rate: 18  breaths/min, Blood Pressure: 153/83 mmHg. Respiratory [name], [name] ([id number]) normal breathing without difficulty. Psychiatric this patient is able to make decisions and demonstrates good insight into disease process. Alert and Oriented x 3. pleasant and cooperative. General Notes: Patient's wounds did not require sharp debridement today which is great news. Overall I am extremely pleased with where things stand at this point. I think that she is making good progress in regard to the leg and the new skin tear on the forearm is also significantly improved. Integumentary (Hair, Skin) Wound #1 status is Healed - Epithelialized. Original cause of wound was Gradually Appeared. The date acquired was: 07/07/2021. The wound has been in treatment 8 weeks. The wound is located on the Right,Medial Lower Leg. The wound measures 0cm length x 0cm width x 0cm depth; 0cm^2 area and 0cm^3 volume. There is Fat Layer (Subcutaneous Tissue) exposed. There is no tunneling or undermining noted. There is a none present amount of drainage noted. There is no granulation within the wound bed. There is no necrotic tissue within the wound bed. Wound #2 status is Open. Original cause of wound was Gradually Appeared. The date acquired was: 07/08/2021. The wound has been in treatment 8 weeks. The wound is located on the Right,Lateral Lower Leg. The wound measures 5.4cm length x 1cm width  x 0.3cm depth; 4.241cm^2 area and 1.272cm^3 volume. There is Fat Layer (Subcutaneous Tissue) exposed. There is a medium amount of serosanguineous drainage noted. There is large (67-100%) red granulation within the wound bed. There is a small (1-33%) amount of necrotic tissue within the wound bed including Adherent Slough. Wound #3 status is Healed - Epithelialized. Original cause of wound was Bump. The date acquired was: 12/22/2021. The wound has been in treatment 3 weeks. The wound is located on the [place]. The wound measures  0cm length x 0cm width x 0cm depth; 0cm^2 area and 0cm^3 volume. There is no tunneling or undermining noted. There is a none present amount of drainage noted. There is no granulation within the wound bed. There is no necrotic tissue within the wound bed. Wound #4 status is Open. Original cause of wound was Skin Tear/Laceration. The date acquired was: 01/18/2022. The wound is located on the Left Forearm. The wound measures 2.4cm length x 2.7cm width x 0.1cm depth; 5.089cm^2 area and 0.509cm^3 volume. There is Fat Layer (Subcutaneous Tissue) exposed. There is no tunneling or undermining noted. There is a medium amount of serosanguineous drainage noted. There is a small (1-33%) amount of necrotic tissue within the wound bed including Adherent Slough. Assessment Active Problems ICD-10 Chronic venous hypertension (idiopathic) with ulcer and inflammation of right lower extremity Non-pressure chronic ulcer of other part of right lower leg with fat layer exposed Cellulitis of right lower limb Laceration without foreign body of left forearm, initial encounter Other specified peripheral vascular diseases Acquired absence of other right toe(s) Plan Follow-up Appointments: Return Appointment in 1 week. Bathing/ Shower/ Hygiene: May shower; gently cleanse wound with antibacterial soap, rinse and pat dry prior to dressing wounds - Hibiclens recommended for washing right lower leg prior to dressing changes. You may pick this up at a pharmacy or purchase on [place] No tub bath. Edema Control - Lymphedema / Segmental Compressive Device / Other: Tubigrip double layer applied - Tubi D double layer Elevate, Exercise Daily and Avoid Standing for Long Periods of Time. Elevate legs to the level of the heart and pump ankles as often as possible Elevate leg(s) parallel to the floor when sitting. Consults ordered were: Rheumatology - Refer to Rheumatology Dr. Posey Pronto WOUND #2: - Lower Leg Wound Laterality: Right,  Lateral Cleanser: Hibiclens, 16 (oz) 3 x Per Week/30 Days Discharge Instructions: Put Hibiclens on your skin and rub it in gently for five minutes with a washcloth. Turn the water back on and rinse very well with warm water. Do not use your regular soap after using and rinsing Hibiclens. Pat yourself dry with a clean towel. Primary Dressing: Silvercel 4 1/4x 4 1/4 (in/in) 3 x Per Week/30 Days Discharge Instructions: Apply Silvercel 4 1/4x 4 1/4 (in/in) as instructed Secondary Dressing: ABD Pad 5x9 (in/in) 3 x Per Week/30 Days Discharge Instructions: Cover with ABD pad Secured With: Medipore Tape - 52M Medipore H Soft Cloth Surgical Tape, 2x2 (in/yd) 3 x Per Week/30 Days Secured With: Conform 4'' - Conforming Stretch Gauze Bandage 4x75 (in/in) 3 x Per Week/30 Days [name], [name] ([id number]) Discharge Instructions: Apply as directed Secured With: Tubigrip Size D, 3x10 (in/yd) 3 x Per Week/30 Days WOUND #4: - Forearm Wound Laterality: Left Cleanser: Normal Saline Discharge Instructions: Wash your hands with soap and water. Remove old dressing, discard into plastic bag and place into trash. Cleanse the wound with Normal Saline prior to applying a clean dressing using gauze sponges, not tissues or cotton balls.  Do not scrub or use excessive force. Pat dry using gauze sponges, not tissue or cotton balls. Cleanser: Soap and Water Discharge Instructions: Gently cleanse wound with antibacterial soap, rinse and pat dry prior to dressing wounds Primary Dressing: Xeroform-HBD 2x2 (in/in) Discharge Instructions: Apply Xeroform-HBD 2x2 (in/in) as directed Secondary Dressing: (BORDER) Zetuvit Plus SILICONE BORDER Dressing 4x4 (in/in) Discharge Instructions: Please do not put silicone bordered dressings under wraps. Use non-bordered dressing only. 1. Would recommend currently that we go ahead and recommend Xeroform gauze for the forearm which I think should do quite well. 2. I am also can recommend  that we have the patient continue with the ABD pad instruction after this area which should do well. 3. I am also can recommend that we have the patient continue with the cleaning with Hibiclens followed by the silver alginate dressing ABD pad and roll gauze to secure in place along with the Tubigrip size D for the right leg. 4. Due to the fact that the patient continues to have these blisterlike areas that are popping up of the foot off and on and has had 2 negative cultures 1 by myself 1 by dermatology I really feel like this may be more of a rheumatologic type issue although I am unsure of exactly what we are dealing with. I would like to see about a referral to rheumatology in order to have them further evaluate and see if there is anything that can be determined to hopefully try to prevent this from continuing to be a problem. We will see patient back for reevaluation in 1 week here in the clinic. If anything worsens or changes patient will contact our office for additional recommendations. Electronic Signature(s) Signed: 01/28/2022 12:41:01 PM By: Worthy Keeler PA-C Previous Signature: 01/26/2022 8:24:36 AM Version By: Worthy Keeler PA-C Entered By: Worthy Keeler on 01/28/2022 12:41:01 [name], [name] ([id number]) -------------------------------------------------------------------------------- SuperBill Details Patient Name: [name]. Date of Service: 01/25/2022 Medical Record Number: [id number] Patient Account Number: [id number] Date of Birth/Sex: Jul 22, 1950 (71 y.o. 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) Facility Procedures CPT4 Code: 85885027 Description: 99214 - WOUND CARE VISIT-LEV 4 EST PT Modifier: Quantity: 1 Physician Procedures CPT4 Code Description: [id number] 99213 - WC PHYS LEVEL 3 - EST PT Modifier: Quantity: 1 CPT4 Code Description: ICD-10 Diagnosis Description I87.331 Chronic venous hypertension (idiopathic) with ulcer and inflammation of L97.812 Non-pressure chronic ulcer of other part of right lower leg with fat la L03.115 Cellulitis of right lower limb  S51.812A Laceration without foreign body of left forearm, initial encounter Modifier: right lower extremit yer exposed Quantity: y Engineer, maintenance) Signed: 01/25/2022 6:15:22 PM By: Worthy Keeler PA-C Entered By: Worthy Keeler on 01/25/2022 18:15:22

## 2022-01-25 NOTE — Progress Notes (Signed)
ROGUE, PAUTLER ([id number]) Visit Report for 01/25/2022 Arrival Information Details Patient Name: [name], [name]. Date of Service: 01/25/2022 10:30 AM Medical Record Number: [id number] Patient Account Number: [id number] Date of Birth/Sex: 11/16/1950 (71 y.o. F) Treating RN: Carlene Coria Primary Care Tresa Jolley: [name] Other Clinician: Massie Kluver Referring Zanden Colver: [name] Treating Chaun Uemura/Extender: Skipper Cliche in Treatment: 8 Visit Information History Since Last Visit All ordered tests and consults were completed: No Patient Arrived: Ambulatory Added or deleted any medications: No Arrival Time: 10:38 Any new allergies or adverse reactions: No Transfer Assistance: None Had a fall or experienced change in No Patient Requires Transmission-Based No activities of daily living that may affect Precautions: risk of falls: Patient Has Alerts: Yes Hospitalized since last visit: No Patient Alerts: Patient on Blood Pain Present Now: No Thinner Electronic Signature(s) Unsigned Entered ByMassie Kluver on 01/25/2022 10:45:42 Signature(s): Date(s): [name] ([id number]) -------------------------------------------------------------------------------- Clinic Level of Care Assessment Details Patient Name: [name], [name]. Date of Service: 01/25/2022 10:30 AM Medical Record Number: [id number] Patient Account Number: [id number] Date of Birth/Sex: May 28, 1951 (71 y.o. F) Treating RN: Carlene Coria Primary Care Zakaree Mcclenahan: [name] Other Clinician: Massie Kluver Referring Lawana Hartzell: [name] Treating Sameera Betton/Extender: Skipper Cliche in Treatment: 8 Clinic Level of Care Assessment Items TOOL 4 Quantity Score '[]'$  - Use when only an EandM is performed on FOLLOW-UP visit 0 ASSESSMENTS - Nursing Assessment / Reassessment X - Reassessment of Co-morbidities (includes updates in patient status) 1 10 X- 1 5 Reassessment of Adherence to Treatment  Plan ASSESSMENTS - Wound and Skin Assessment / Reassessment '[]'$  - Simple Wound Assessment / Reassessment - one wound 0 X- 4 5 Complex Wound Assessment / Reassessment - multiple wounds '[]'$  - 0 Dermatologic / Skin Assessment (not related to wound area) ASSESSMENTS - Focused Assessment '[]'$  - Circumferential Edema Measurements - multi extremities 0 '[]'$  - 0 Nutritional Assessment / Counseling / Intervention '[]'$  - 0 Lower Extremity Assessment (monofilament, tuning fork, pulses) '[]'$  - 0 Peripheral Arterial Disease Assessment (using hand held doppler) ASSESSMENTS - Ostomy and/or Continence Assessment and Care '[]'$  - Incontinence Assessment and Management 0 '[]'$  - 0 Ostomy Care Assessment and Management (repouching, etc.) PROCESS - Coordination of Care X - Simple Patient / Family Education for ongoing care 1 15 '[]'$  - 0 Complex (extensive) Patient / Family Education for ongoing care '[]'$  - 0 Staff obtains Programmer, systems, Records, Test Results / Process Orders '[]'$  - 0 Staff telephones HHA, Nursing Homes / Clarify orders / etc '[]'$  - 0 Routine Transfer to another Facility (non-emergent condition) '[]'$  - 0 Routine Hospital Admission (non-emergent condition) '[]'$  - 0 New Admissions / Biomedical engineer / Ordering NPWT, Apligraf, etc. '[]'$  - 0 Emergency Hospital Admission (emergent condition) X- 1 10 Simple Discharge Coordination '[]'$  - 0 Complex (extensive) Discharge Coordination PROCESS - Special Needs '[]'$  - Pediatric / Minor Patient Management 0 '[]'$  - 0 Isolation Patient Management '[]'$  - 0 Hearing / Language / Visual special needs '[]'$  - 0 Assessment of Community assistance (transportation, D/C planning, etc.) '[]'$  - 0 Additional assistance / Altered mentation '[]'$  - 0 Support Surface(s) Assessment (bed, cushion, seat, etc.) INTERVENTIONS - Wound Cleansing / Measurement Mccarthy, Brandy K. ([id number]) '[]'$  - 0 Simple Wound Cleansing - one wound X- 4 5 Complex Wound Cleansing - multiple wounds X- 1 5 Wound  Imaging (photographs - any number of wounds) '[]'$  - 0 Wound Tracing (instead of photographs) '[]'$  - 0 Simple Wound Measurement - one wound X- 2 5 Complex Wound  Measurement - multiple wounds INTERVENTIONS - Wound Dressings '[]'$  - Small Wound Dressing one or multiple wounds 0 X- 2 15 Medium Wound Dressing one or multiple wounds '[]'$  - 0 Large Wound Dressing one or multiple wounds '[]'$  - 0 Application of Medications - topical '[]'$  - 0 Application of Medications - injection INTERVENTIONS - Miscellaneous '[]'$  - External ear exam 0 '[]'$  - 0 Specimen Collection (cultures, biopsies, blood, body fluids, etc.) '[]'$  - 0 Specimen(s) / Culture(s) sent or taken to Lab for analysis '[]'$  - 0 Patient Transfer (multiple staff / Civil Service fast streamer / Similar devices) '[]'$  - 0 Simple Staple / Suture removal (25 or less) '[]'$  - 0 Complex Staple / Suture removal (26 or more) '[]'$  - 0 Hypo / Hyperglycemic Management (close monitor of Blood Glucose) '[]'$  - 0 Ankle / Brachial Index (ABI) - do not check if billed separately X- 1 5 Vital Signs Has the patient been seen at the hospital within the last three years: Yes Total Score: 130 Level Of Care: New/Established - Level 4 Electronic Signature(s) Unsigned Entered By: Massie Kluver on 01/25/2022 11:19:19 Signature(s): Date(s): [name] ([id number]) -------------------------------------------------------------------------------- Lower Extremity Assessment Details Patient Name: [name], [name]. Date of Service: 01/25/2022 10:30 AM Medical Record Number: [id number] Patient Account Number: [id number] Date of Birth/Sex: 1950-07-14 (70 y.o. F) Treating RN: Carlene Coria Primary Care Asusena Sigley: [name] Other Clinician: Massie Kluver Referring Fatima Fedie: [name] Treating Bulmaro Feagans/Extender: Skipper Cliche in Treatment: 8 Edema Assessment Assessed: [Left: No] [Right: Yes] Edema: [Left: N] [Right: o] Calf Left: Right: Point of Measurement: 32 cm From Medial  Instep 30.9 cm Ankle Left: Right: Point of Measurement: 10 cm From Medial Instep 21 cm Vascular Assessment Pulses: Dorsalis Pedis Palpable: [Right:Yes] Electronic Signature(s) Unsigned Entered ByMassie Kluver on 01/25/2022 11:07:13 Signature(s): Date(s): [name] ([id number]) -------------------------------------------------------------------------------- Multi Wound Chart Details Patient Name: [name], [name]. Date of Service: 01/25/2022 10:30 AM Medical Record Number: [id number] Patient Account Number: [id number] Date of Birth/Sex: 11/14/1950 (71 y.o. F) Treating RN: Carlene Coria Primary Care Yamira Papa: [name] Other Clinician: Massie Kluver Referring Skyllar Notarianni: [name] Treating Mischa Brittingham/Extender: Skipper Cliche in Treatment: 8 Vital Signs Height(in): 61 Pulse(bpm): 93 Weight(lbs): 147 Blood Pressure(mmHg): 153/83 Body Mass Index(BMI): 26 Temperature(F): 98.5 Respiratory Rate(breaths/min): 18 Photos: Wound Location: Right, Medial Lower Leg Right, Lateral Lower Leg Right, Plantar Foot Wounding Event: Gradually Appeared Gradually Appeared Bump Primary Etiology: Venous Leg Ulcer Venous Leg Ulcer Cellulitis Comorbid History: Hypertension, Peripheral Arterial Hypertension, Peripheral Arterial Hypertension, Peripheral Arterial Disease Disease Disease Date Acquired: 07/07/2021 07/08/2021 12/22/2021 Weeks of Treatment: '8 8 3 '$ Wound Status: Open Open Open Wound Recurrence: No No No Measurements L x W x D (cm) 0.1x0.1x0.1 5.4x1x0.3 0.1x0.1x0.1 Area (cm) : 0.008 4.241 0.008 Volume (cm) : 0.001 1.272 0.001 % Reduction in Area: 99.70% 51.80% 96.40% % Reduction in Volume: 99.90% 63.90% 97.70% Classification: Full Thickness Without Exposed Full Thickness Without Exposed Full Thickness Without Exposed Support Structures Support Structures Support Structures Exudate Amount: None Present Medium None Present Exudate Type: N/A Serosanguineous N/A Exudate Color: N/A  red, brown N/A Granulation Amount: None Present (0%) Large (67-100%) None Present (0%) Granulation Quality: N/A Red N/A Necrotic Amount: None Present (0%) Small (1-33%) None Present (0%) Exposed Structures: Fat Layer (Subcutaneous Tissue): Fat Layer (Subcutaneous Tissue): N/A Yes Yes Fascia: No Fascia: No Tendon: No Tendon: No Muscle: No Muscle: No Joint: No Joint: No Bone: No Bone: No Epithelialization: None None Large (67-100%) Wound Number: 4 N/A N/A Photos: N/A N/A Valere Dross  Raliegh Ip ([id number]) Wound Location: Left Forearm N/A N/A Wounding Event: Skin Tear/Laceration N/A N/A Primary Etiology: Trauma, Other N/A N/A Comorbid History: Hypertension, Peripheral Arterial N/A N/A Disease Date Acquired: 01/18/2022 N/A N/A Weeks of Treatment: 0 N/A N/A Wound Status: Open N/A N/A Wound Recurrence: No N/A N/A Measurements L x W x D (cm) 2.4x2.7x0.1 N/A N/A Area (cm) : 5.089 N/A N/A Volume (cm) : 0.509 N/A N/A % Reduction in Area: N/A N/A N/A % Reduction in Volume: N/A N/A N/A Classification: Full Thickness Without Exposed N/A N/A Support Structures Exudate Amount: Medium N/A N/A Exudate Type: Serosanguineous N/A N/A Exudate Color: red, brown N/A N/A Granulation Amount: N/A N/A N/A Granulation Quality: N/A N/A N/A Necrotic Amount: N/A N/A N/A Exposed Structures: Fat Layer (Subcutaneous Tissue): N/A N/A Yes Fascia: No Tendon: No Muscle: No Joint: No Bone: No Epithelialization: None N/A N/A Treatment Notes Electronic Signature(s) Unsigned Entered ByMassie Kluver on 01/25/2022 11:07:28 Signature(s): Date(s): [name] ([id number]) -------------------------------------------------------------------------------- [place] Details Patient Name: [name], [name]. Date of Service: 01/25/2022 10:30 AM Medical Record Number: [id number] Patient Account Number: [id number] Date of Birth/Sex: 03-03-1951 (71 y.o. F) Treating RN: Carlene Coria Primary Care Sativa Gelles: [name] Other Clinician: Massie Kluver Referring Cena Bruhn: [name] Treating Parv Manthey/Extender: Skipper Cliche in Treatment: 8 Active Inactive Wound/Skin Impairment Nursing Diagnoses: Knowledge deficit related to ulceration/compromised skin integrity Goals: Patient/caregiver will verbalize understanding of skin care regimen Date Initiated: 11/30/2021 Target Resolution Date: 12/30/2021 Goal Status: Active Ulcer/skin breakdown will have a volume reduction of 30% by week 4 Date Initiated: 11/30/2021 Target Resolution Date: 01/30/2022 Goal Status: Active Ulcer/skin breakdown will have a volume reduction of 50% by week 8 Date Initiated: 11/30/2021 Target Resolution Date: 03/02/2022 Goal Status: Active Ulcer/skin breakdown will have a volume reduction of 80% by week 12 Date Initiated: 11/30/2021 Target Resolution Date: 04/01/2022 Goal Status: Active Ulcer/skin breakdown will heal within 14 weeks Date Initiated: 11/30/2021 Target Resolution Date: 05/02/2022 Goal Status: Active Interventions: Assess patient/caregiver ability to obtain necessary supplies Assess patient/caregiver ability to perform ulcer/skin care regimen upon admission and as needed Assess ulceration(s) every visit Notes: Electronic Signature(s) Unsigned Entered By: Massie Kluver on 01/25/2022 11:07:21 Signature(s): Date(s): [name] ([id number]) -------------------------------------------------------------------------------- Pain Assessment Details Patient Name: [name], [name]. Date of Service: 01/25/2022 10:30 AM Medical Record Number: [id number] Patient Account Number: [id number] Date of Birth/Sex: April 22, 1951 (71 y.o. F) Treating RN: Carlene Coria Primary Care Rocsi Hazelbaker: [name] Other Clinician: Massie Kluver Referring Brycelynn Stampley: [name] Treating Retta Pitcher/Extender: Skipper Cliche in Treatment: 8 Active Problems Location of Pain Severity and Description  of Pain Patient Has Paino No Site Locations Pain Management and Medication Current Pain Management: Electronic Signature(s) Unsigned Entered By: Massie Kluver on 01/25/2022 10:48:23 Signature(s): Date(s): [name] ([id number]) -------------------------------------------------------------------------------- Patient/Caregiver Education Details Patient Name: [name]. Date of Service: 01/25/2022 10:30 AM Medical Record Number: [id number] Patient Account Number: [id number] Date of Birth/Gender: 1950-08-15 (71 y.o. F) Treating RN: Carlene Coria Primary Care Physician: [name] Other Clinician: Massie Kluver Referring Physician: [name] Treating Physician/Extender: Skipper Cliche in Treatment: 8 Education Assessment Education Provided To: Patient Education Topics Provided Wound/Skin Impairment: Handouts: Other: continue wound care as directed Methods: Explain/Verbal Responses: State content correctly Electronic Signature(s) Unsigned Entered By: Massie Kluver on 01/25/2022 11:19:54 Signature(s): Date(s): [name] ([id number]) -------------------------------------------------------------------------------- Wound Assessment Details Patient Name: [name], [name]. Date of Service: 01/25/2022 10:30 AM Medical Record Number: [id number] Patient Account Number: [id number] Date of Birth/Sex: July 08, 1950 (71 y.o. F)  Treating RN: Carlene Coria Primary Care Dawsen Krieger: [name] Other Clinician: Massie Kluver Referring Orie Baxendale: [name] Treating Bryelle Spiewak/Extender: Skipper Cliche in Treatment: 8 Wound Status Wound Number: 1 Primary Etiology: Venous Leg Ulcer Wound Location: Right, Medial Lower Leg Wound Status: Healed - Epithelialized Wounding Event: Gradually Appeared Comorbid History: Hypertension, Peripheral Arterial Disease Date Acquired: 07/07/2021 Weeks Of Treatment: 8 Clustered Wound: No Photos Wound Measurements Length: (cm)  0 Width: (cm) 0 Depth: (cm) 0 Area: (cm) 0 Volume: (cm) 0 % Reduction in Area: 100% % Reduction in Volume: 100% Epithelialization: None Tunneling: No Undermining: No Wound Description Classification: Full Thickness Without Exposed Support Structures Exudate Amount: None Present Foul Odor After Cleansing: No Slough/Fibrino No Wound Bed Granulation Amount: None Present (0%) Exposed Structure Necrotic Amount: None Present (0%) Fascia Exposed: No Fat Layer (Subcutaneous Tissue) Exposed: Yes Tendon Exposed: No Muscle Exposed: No Joint Exposed: No Bone Exposed: No Electronic Signature(s) Unsigned Entered ByMassie Kluver on 01/25/2022 11:16:59 Signature(s): Date(s): [name] ([id number]) -------------------------------------------------------------------------------- Wound Assessment Details Patient Name: [name], [name]. Date of Service: 01/25/2022 10:30 AM Medical Record Number: [id number] Patient Account Number: [id number] Date of Birth/Sex: [birth year] (71 y.o. F) Treating RN: Carlene Coria Primary Care Keithan Dileonardo: [name] Other Clinician: Massie Kluver Referring Cordell Guercio: [name] Treating Alika Saladin/Extender: Skipper Cliche in Treatment: 8 Wound Status Wound Number: 2 Primary Etiology: Venous Leg Ulcer Wound Location: Right, Lateral Lower Leg Wound Status: Open Wounding Event: Gradually Appeared Comorbid History: Hypertension, Peripheral Arterial Disease Date Acquired: 07/08/2021 Weeks Of Treatment: 8 Clustered Wound: No Photos Wound Measurements Length: (cm) 5.4 Width: (cm) 1 Depth: (cm) 0.3 Area: (cm) 4.241 Volume: (cm) 1.272 % Reduction in Area: 51.8% % Reduction in Volume: 63.9% Epithelialization: None Wound Description Classification: Full Thickness Without Exposed Support Structures Exudate Amount: Medium Exudate Type: Serosanguineous Exudate Color: red, brown Foul Odor After Cleansing: No Slough/Fibrino Yes Wound  Bed Granulation Amount: Large (67-100%) Exposed Structure Granulation Quality: Red Fascia Exposed: No Necrotic Amount: Small (1-33%) Fat Layer (Subcutaneous Tissue) Exposed: Yes Necrotic Quality: Adherent Slough Tendon Exposed: No Muscle Exposed: No Joint Exposed: No Bone Exposed: No Electronic Signature(s) Unsigned Entered ByMassie Kluver on 01/25/2022 11:03:16 Signature(s): Date(s): [name] ([id number]) -------------------------------------------------------------------------------- Wound Assessment Details Patient Name: [name], [name]. Date of Service: 01/25/2022 10:30 AM Medical Record Number: [id number] Patient Account Number: [id number] Date of Birth/Sex: 04-02-1951 (71 y.o. F) Treating RN: Carlene Coria Primary Care Fredna Stricker: [name] Other Clinician: Massie Kluver Referring Tricia Pledger: [name] Treating Brier Firebaugh/Extender: Skipper Cliche in Treatment: 8 Wound Status Wound Number: 3 Primary Etiology: Cellulitis Wound Location: Right, Plantar Foot Wound Status: Healed - Epithelialized Wounding Event: Bump Comorbid History: Hypertension, Peripheral Arterial Disease Date Acquired: 12/22/2021 Weeks Of Treatment: 3 Clustered Wound: No Photos Wound Measurements Length: (cm) 0 Width: (cm) 0 Depth: (cm) 0 Area: (cm) 0 Volume: (cm) 0 % Reduction in Area: 100% % Reduction in Volume: 100% Epithelialization: Large (67-100%) Tunneling: No Undermining: No Wound Description Classification: Full Thickness Without Exposed Support Structures Exudate Amount: None Present Foul Odor After Cleansing: No Slough/Fibrino No Wound Bed Granulation Amount: None Present (0%) Necrotic Amount: None Present (0%) Electronic Signature(s) Unsigned Entered ByMassie Kluver on 01/25/2022 11:17:14 Signature(s): Date(s): [name] ([id number]) -------------------------------------------------------------------------------- Wound Assessment  Details Patient Name: [name], [name]. Date of Service: 01/25/2022 10:30 AM Medical Record Number: [id number] Patient Account Number: [id number] Date of Birth/Sex: Apr 14, 1951 (71 y.o. F) Treating RN: Carlene Coria Primary Care Taj Arteaga: [name] Other Clinician: Massie Kluver Referring Treyton Slimp:  LEE, Joylene Igo Treating Dedrick Heffner/Extender: Jeri Cos Weeks in Treatment: 8 Wound Status Wound Number: 4 Primary Etiology: Trauma, Other Wound Location: Left Forearm Wound Status: Open Wounding Event: Skin Tear/Laceration Comorbid History: Hypertension, Peripheral Arterial Disease Date Acquired: 01/18/2022 Weeks Of Treatment: 0 Clustered Wound: No Photos Wound Measurements Length: (cm) 2.4 Width: (cm) 2.7 Depth: (cm) 0.1 Area: (cm) 5.089 Volume: (cm) 0.509 % Reduction in Area: % Reduction in Volume: Epithelialization: None Tunneling: No Undermining: No Wound Description Classification: Full Thickness Without Exposed Support Structures Exudate Amount: Medium Exudate Type: Serosanguineous Exudate Color: red, brown Foul Odor After Cleansing: No Slough/Fibrino Yes Wound Bed Necrotic Amount: Small (1-33%) Exposed Structure Necrotic Quality: Adherent Slough Fascia Exposed: No Fat Layer (Subcutaneous Tissue) Exposed: Yes Tendon Exposed: No Muscle Exposed: No Joint Exposed: No Bone Exposed: No Electronic Signature(s) Unsigned Entered ByMassie Kluver on 01/25/2022 11:06:01 Signature(s): Date(s): [name] ([id number]) -------------------------------------------------------------------------------- Vitals Details Patient Name: [name]. Date of Service: 01/25/2022 10:30 AM Medical Record Number: [id number] Patient Account Number: [id number] Date of Birth/Sex: [birth year] (71 y.o. F) Treating RN: Carlene Coria Primary Care Michail Boyte: [name] Other Clinician: Massie Kluver Referring Yarenis Cerino: [name] Treating Atreyu Mak/Extender: Skipper Cliche in  Treatment: 8 Vital Signs Time Taken: 10:47 Temperature (F): 98.5 Height (in): 63 Pulse (bpm): 93 Weight (lbs): 147 Respiratory Rate (breaths/min): 18 Body Mass Index (BMI): 26 Blood Pressure (mmHg): 153/83 Reference Range: 80 - 120 mg / dl Electronic Signature(s) Unsigned Entered ByMassie Kluver on 01/25/2022 10:48:15 Signature(s): Date(s):

## 2022-01-31 DIAGNOSIS — R600 Localized edema: Secondary | ICD-10-CM | POA: Diagnosis not present

## 2022-01-31 DIAGNOSIS — G4700 Insomnia, unspecified: Secondary | ICD-10-CM | POA: Diagnosis not present

## 2022-01-31 DIAGNOSIS — I739 Peripheral vascular disease, unspecified: Secondary | ICD-10-CM | POA: Diagnosis not present

## 2022-01-31 DIAGNOSIS — R7303 Prediabetes: Secondary | ICD-10-CM | POA: Diagnosis not present

## 2022-01-31 DIAGNOSIS — J309 Allergic rhinitis, unspecified: Secondary | ICD-10-CM | POA: Diagnosis not present

## 2022-01-31 DIAGNOSIS — L02611 Cutaneous abscess of right foot: Secondary | ICD-10-CM | POA: Diagnosis not present

## 2022-01-31 DIAGNOSIS — R519 Headache, unspecified: Secondary | ICD-10-CM | POA: Diagnosis not present

## 2022-01-31 DIAGNOSIS — M1990 Unspecified osteoarthritis, unspecified site: Secondary | ICD-10-CM | POA: Diagnosis not present

## 2022-01-31 DIAGNOSIS — K219 Gastro-esophageal reflux disease without esophagitis: Secondary | ICD-10-CM | POA: Diagnosis not present

## 2022-02-04 ENCOUNTER — Other Ambulatory Visit: Payer: Self-pay

## 2022-02-04 MED ORDER — CLOPIDOGREL BISULFATE 75 MG PO TABS: 75.0000 mg | ORAL_TABLET | Freq: Every day | ORAL | 3 refills | Status: DC

## 2022-02-08 ENCOUNTER — Encounter: Payer: Medicare HMO | Attending: Physician Assistant | Admitting: Physician Assistant

## 2022-02-08 DIAGNOSIS — L03115 Cellulitis of right lower limb: Secondary | ICD-10-CM | POA: Insufficient documentation

## 2022-02-08 DIAGNOSIS — S51812A Laceration without foreign body of left forearm, initial encounter: Secondary | ICD-10-CM | POA: Diagnosis not present

## 2022-02-08 DIAGNOSIS — I10 Essential (primary) hypertension: Secondary | ICD-10-CM | POA: Insufficient documentation

## 2022-02-08 DIAGNOSIS — L97812 Non-pressure chronic ulcer of other part of right lower leg with fat layer exposed: Secondary | ICD-10-CM | POA: Insufficient documentation

## 2022-02-08 DIAGNOSIS — I872 Venous insufficiency (chronic) (peripheral): Secondary | ICD-10-CM | POA: Diagnosis not present

## 2022-02-08 NOTE — Progress Notes (Signed)
Brandy, Mccarthy ([id number]) Visit Report for 02/08/2022 Chief Complaint Document Details Patient Name: [name], [name]. Date of Service: 02/08/2022 10:30 AM Medical Record Number: [id number] Patient Account Number: [id number] Date of Birth/Sex: 1950/08/09 (71 y.o. F) Treating RN: Cornell Barman Primary Care Provider: [name] Other Clinician: Massie Kluver Referring Provider: [name] Treating Provider/Extender: Skipper Cliche in Treatment: 10 Information Obtained from: Patient Chief Complaint Right LE Ulcers and left forearm laceration Electronic Signature(s) Signed: 02/08/2022 10:53:22 AM By: Worthy Keeler PA-C Entered By: Worthy Keeler on 02/08/2022 10:53:21 Sobczyk, [name] ([id number]) -------------------------------------------------------------------------------- Debridement Details Patient Name: [name]. Date of Service: 02/08/2022 10:30 AM Medical Record Number: [id number] Patient Account Number: [id number] Date of Birth/Sex: August 30, 1950 (71 y.o. F) Treating RN: Cornell Barman Primary Care Provider: [name] Other Clinician: Massie Kluver Referring Provider: [name] Treating Provider/Extender: Skipper Cliche in Treatment: 10 Debridement Performed for Wound #2 Right,Lateral Lower Leg Assessment: Performed By: Physician Tommie Sams., PA-C Debridement Type: Debridement Severity of Tissue Pre Debridement: Fat layer exposed Level of Consciousness (Pre- Awake and Alert procedure): Pre-procedure Verification/Time Out Yes - 11:28 Taken: Start Time: 11:28 Total Area Debrided (L x W): 6 (cm) x 1 (cm) = 6 (cm) Tissue and other material Viable, Non-Viable, Slough, Subcutaneous, Slough, Other: calcium deposit debrided: Level: Skin/Subcutaneous Tissue Debridement Description: Excisional Instrument: Curette Bleeding: Minimum Hemostasis Achieved: Pressure End Time: 11:32 Response to Treatment: Procedure was tolerated well Level of Consciousness  (Post- Awake and Alert procedure): Post Debridement Measurements of Total Wound Length: (cm) 6 Width: (cm) 1 Depth: (cm) 0.5 Volume: (cm) 2.356 Character of Wound/Ulcer Post Debridement: Stable Severity of Tissue Post Debridement: Fat layer exposed Post Procedure Diagnosis Same as Pre-procedure Electronic Signature(s) Unsigned Entered By: Massie Kluver on 02/08/2022 11:33:41 Signature(s): Date(s): [name] ([id number]) -------------------------------------------------------------------------------- Physician Orders Details Patient Name: [name]. Date of Service: 02/08/2022 10:30 AM Medical Record Number: [id number] Patient Account Number: [id number] Date of Birth/Sex: Aug 06, 1950 (71 y.o. F) Treating RN: Cornell Barman Primary Care Provider: [name] Other Clinician: Massie Kluver Referring Provider: [name] Treating Provider/Extender: Skipper Cliche in Treatment: 10 Verbal / Phone Orders: No Diagnosis Coding ICD-10 Coding Code Description I87.331 Chronic venous hypertension (idiopathic) with ulcer and inflammation of right lower extremity L97.812 Non-pressure chronic ulcer of other part of right lower leg with fat layer exposed L03.115 Cellulitis of right lower limb S51.812A Laceration without foreign body of left forearm, initial encounter I73.89 Other specified peripheral vascular diseases 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 Bathing/ Shower/ Hygiene o May shower; gently cleanse wound with antibacterial soap, rinse and pat dry prior to dressing wounds - Hibiclens recommended for washing right lower leg prior to dressing changes. You may pick this up at a pharmacy or purchase on [place] o No tub bath. Edema Control - Lymphedema / Segmental Compressive Device / Other o Tubigrip double layer applied - Tubi D double layer o Elevate, Exercise Daily and Avoid Standing for Long  Periods of Time. o Elevate legs to the level of the heart and pump ankles as often as possible o Elevate leg(s) parallel to the floor when sitting. Wound Treatment Wound #2 - Lower Leg Wound Laterality: Right, Lateral Cleanser: Hibiclens, 16 (oz) 3 x Per Week/30 Days Discharge Instructions: Put Hibiclenso on your skin and rub it in gently for five minutes with a washcloth. Turn the water back on and rinse very well with warm water. Do not  use your regular soap after using and rinsing Hibiclenso. Pat yourself dry with a clean towel. Primary Dressing: Silvercel 4 1/4x 4 1/4 (in/in) 3 x Per Week/30 Days Discharge Instructions: Apply Silvercel 4 1/4x 4 1/4 (in/in) as instructed Secondary Dressing: ABD Pad 5x9 (in/in) 3 x Per Week/30 Days Discharge Instructions: Cover with ABD pad Secured With: Medipore Tape - 30M Medipore H Soft Cloth Surgical Tape, 2x2 (in/yd) 3 x Per Week/30 Days Secured With: Conform 4'' - Conforming Stretch Gauze Bandage 4x75 (in/in) 3 x Per Week/30 Days Discharge Instructions: Apply as directed Secured With: Tubigrip Size D, 3x10 (in/yd) 3 x Per Week/30 Days Electronic Signature(s) Unsigned Entered By: Massie Kluver on 02/08/2022 11:33:58 Signature(s): Date(s): [name] ([id number]) -------------------------------------------------------------------------------- Problem List Details Patient Name: [name]. Date of Service: 02/08/2022 10:30 AM Medical Record Number: [id number] Patient Account Number: [id number] Date of Birth/Sex: 16-Feb-1951 (71 y.o. F) Treating RN: Cornell Barman Primary Care Provider: [name] Other Clinician: Massie Kluver Referring Provider: [name] Treating Provider/Extender: Skipper Cliche in Treatment: 10 Active Problems ICD-10 Encounter Code Description Active Date MDM Diagnosis I87.331 Chronic venous hypertension (idiopathic) with ulcer and inflammation of 11/30/2021 No Yes right lower extremity L97.812  Non-pressure chronic ulcer of other part of right lower leg with fat layer 11/30/2021 No Yes exposed L03.115 Cellulitis of right lower limb 11/30/2021 No Yes S51.812A Laceration without foreign body of left forearm, initial encounter 01/25/2022 No Yes I73.89 Other specified peripheral vascular diseases 11/30/2021 No Yes 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

## 2022-02-08 NOTE — Progress Notes (Signed)
XARENI, KELCH ([id number]) Visit Report for 02/08/2022 Arrival Information Details Patient Name: [name], [name]. Date of Service: 02/08/2022 10:30 AM Medical Record Number: [id number] Patient Account Number: [id number] Date of Birth/Sex: 01/15/1951 (71 y.o. F) Treating RN: Cornell Barman Primary Care Breona Cherubin: [name] Other Clinician: Massie Kluver Referring Taja Pentland: [name] Treating Clydine Parkison/Extender: Skipper Cliche in Treatment: 10 Visit Information History Since Last Visit All ordered tests and consults were completed: No Patient Arrived: Ambulatory Added or deleted any medications: No Arrival Time: 10:45 Any new allergies or adverse reactions: No Transfer Assistance: None Had a fall or experienced change in No Patient Requires Transmission-Based No activities of daily living that may affect Precautions: risk of falls: Patient Has Alerts: Yes Hospitalized since last visit: No Patient Alerts: Patient on Blood Pain Present Now: No Thinner Electronic Signature(s) Unsigned Entered ByMassie Kluver on 02/08/2022 10:53:53 Signature(s): Date(s): [name] ([id number]) -------------------------------------------------------------------------------- Clinic Level of Care Assessment Details Patient Name: [name]. Date of Service: 02/08/2022 10:30 AM Medical Record Number: [id number] Patient Account Number: [id number] Date of Birth/Sex: 09-07-1950 (71 y.o. F) Treating RN: Cornell Barman Primary Care Nathanie Ottley: [name] Other Clinician: Massie Kluver Referring Andrian Sabala: [name] Treating Majestic Brister/Extender: Skipper Cliche in Treatment: 10 Clinic Level of Care Assessment Items TOOL 1 Quantity Score '[]'$  - Use when EandM and Procedure is performed on INITIAL visit 0 ASSESSMENTS - Nursing Assessment / Reassessment '[]'$  - General Physical Exam (combine w/ comprehensive assessment (listed just below) when performed on new 0 pt. evals) '[]'$  -  0 Comprehensive Assessment (HX, ROS, Risk Assessments, Wounds Hx, etc.) ASSESSMENTS - Wound and Skin Assessment / Reassessment '[]'$  - Dermatologic / Skin Assessment (not related to wound area) 0 ASSESSMENTS - Ostomy and/or Continence Assessment and Care '[]'$  - Incontinence Assessment and Management 0 '[]'$  - 0 Ostomy Care Assessment and Management (repouching, etc.) PROCESS - Coordination of Care '[]'$  - Simple Patient / Family Education for ongoing care 0 '[]'$  - 0 Complex (extensive) Patient / Family Education for ongoing care '[]'$  - 0 Staff obtains Programmer, systems, Records, Test Results / Process Orders '[]'$  - 0 Staff telephones HHA, Nursing Homes / Clarify orders / etc '[]'$  - 0 Routine Transfer to another Facility (non-emergent condition) '[]'$  - 0 Routine Hospital Admission (non-emergent condition) '[]'$  - 0 New Admissions / Biomedical engineer / Ordering NPWT, Apligraf, etc. '[]'$  - 0 Emergency Hospital Admission (emergent condition) PROCESS - Special Needs '[]'$  - Pediatric / Minor Patient Management 0 '[]'$  - 0 Isolation Patient Management '[]'$  - 0 Hearing / Language / Visual special needs '[]'$  - 0 Assessment of Community assistance (transportation, D/C planning, etc.) '[]'$  - 0 Additional assistance / Altered mentation '[]'$  - 0 Support Surface(s) Assessment (bed, cushion, seat, etc.) INTERVENTIONS - Miscellaneous '[]'$  - External ear exam 0 '[]'$  - 0 Patient Transfer (multiple staff / Civil Service fast streamer / Similar devices) '[]'$  - 0 Simple Staple / Suture removal (25 or less) '[]'$  - 0 Complex Staple / Suture removal (26 or more) '[]'$  - 0 Hypo/Hyperglycemic Management (do not check if billed separately) '[]'$  - 0 Ankle / Brachial Index (ABI) - do not check if billed separately Has the patient been seen at the hospital within the last three years: Yes Total Score: 0 Level Of Care: ____ [name] ([id number]) Electronic Signature(s) Unsigned Entered By: Massie Kluver on 02/08/2022  11:34:05 Signature(s): Date(s): [name] ([id number]) -------------------------------------------------------------------------------- Encounter Discharge Information Details Patient Name: [name]. Date of Service: 02/08/2022 10:30 AM Medical Record Number:  [id number] Patient Account Number: [id number] Date of Birth/Sex: [birth year] (71 y.o. F) Treating RN: Cornell Barman Primary Care Ardyth Kelso: [name] Other Clinician: Massie Kluver Referring Robina Hamor: [name] Treating Ingram Onnen/Extender: Skipper Cliche in Treatment: 10 Encounter Discharge Information Items Post Procedure Vitals Discharge Condition: Stable Temperature (F): 98.5 Ambulatory Status: Ambulatory Pulse (bpm): 82 Discharge Destination: Home Respiratory Rate (breaths/min): 18 Transportation: Private Auto Blood Pressure (mmHg): 127/77 Accompanied By: self Schedule Follow-up Appointment: No Clinical Summary of Care: Electronic Signature(s) Unsigned Entered By: Massie Kluver on 02/08/2022 11:48:59 Signature(s): Date(s): [name] ([id number]) -------------------------------------------------------------------------------- Lower Extremity Assessment Details Patient Name: [name]. Date of Service: 02/08/2022 10:30 AM Medical Record Number: [id number] Patient Account Number: [id number] Date of Birth/Sex: [birth year] (71 y.o. F) Treating RN: Cornell Barman Primary Care Ivionna Verley: [name] Other Clinician: Massie Kluver Referring Tynasia Mccaul: [name] Treating Janeka Libman/Extender: Skipper Cliche in Treatment: 10 Edema Assessment Assessed: [Left: No] [Right: Yes] Edema: [Left: Ye] [Right: s] Calf Left: Right: Point of Measurement: 32 cm From Medial Instep 32.4 cm Ankle Left: Right: Point of Measurement: 10 cm From Medial Instep 21 cm Vascular Assessment Pulses: Dorsalis Pedis Palpable: [Right:Yes] Electronic Signature(s) Unsigned Entered ByMassie Kluver on 02/08/2022  11:09:24 Signature(s): Date(s): [name] ([id number]) -------------------------------------------------------------------------------- Multi Wound Chart Details Patient Name: [name]. Date of Service: 02/08/2022 10:30 AM Medical Record Number: [id number] Patient Account Number: [id number] Date of Birth/Sex: 1951/02/22 (71 y.o. F) Treating RN: Cornell Barman Primary Care Cheray Pardi: [name] Other Clinician: Massie Kluver Referring Jasmaine Rochel: [name] Treating Keymani Mclean/Extender: Skipper Cliche in Treatment: 10 Vital Signs Height(in): 63 Pulse(bpm): 22 Weight(lbs): 147 Blood Pressure(mmHg): 127/77 Body Mass Index(BMI): 26 Temperature(F): 98.5 Respiratory Rate(breaths/min): 16 Photos: [4:No Photos] Wound Location: Right, Lateral Lower Leg Left Forearm Right, Lateral Toe Fifth Wounding Event: Gradually Appeared Skin Tear/Laceration Blister Primary Etiology: Venous Leg Ulcer Trauma, Other Inflammatory Comorbid History: Hypertension, Peripheral Arterial N/A Hypertension, Peripheral Arterial Disease Disease Date Acquired: 07/08/2021 01/18/2022 02/05/2022 Weeks of Treatment: 10 2 0 Wound Status: Open Healed - Epithelialized Open Wound Recurrence: No No No Measurements L x W x D (cm) 6x1x0.3 0x0x0 0.5x0.5x0.1 Area (cm) : 4.712 0 0.196 Volume (cm) : 1.414 0 0.02 % Reduction in Area: 46.40% 100.00% N/A % Reduction in Volume: 59.80% 100.00% N/A Classification: Full Thickness Without Exposed Full Thickness Without Exposed Full Thickness Without Exposed Support Structures Support Structures Support Structures Exudate Amount: Medium Medium Medium Exudate Type: Serosanguineous Serosanguineous Serosanguineous Exudate Color: red, brown red, brown red, brown Granulation Amount: Large (67-100%) N/A None Present (0%) Granulation Quality: Red N/A N/A Necrotic Amount: Small (1-33%) N/A Small (1-33%) Exposed Structures: Fat Layer (Subcutaneous Tissue): N/A Fat Layer  (Subcutaneous Tissue): Yes Yes Fascia: No Fascia: No Tendon: No Tendon: No Muscle: No Muscle: No Joint: No Joint: No Bone: No Bone: No Epithelialization: None N/A None Treatment Notes Electronic Signature(s) Unsigned Entered By: Massie Kluver on 02/08/2022 11:09:47 Signature(s): Date(s): [name] ([id number]) Huxley, [name] ([id number]) -------------------------------------------------------------------------------- [place] Details Patient Name: [name]. Date of Service: 02/08/2022 10:30 AM Medical Record Number: [id number] Patient Account Number: [id number] Date of Birth/Sex: 26-Apr-1951 (71 y.o. F) Treating RN: Cornell Barman Primary Care Madissen Wyse: [name] Other Clinician: Massie Kluver Referring Eleanore Junio: [name] Treating Tehilla Coffel/Extender: Skipper Cliche in Treatment: 10 Active Inactive Wound/Skin Impairment Nursing Diagnoses: Knowledge deficit related to ulceration/compromised skin integrity Goals: Patient/caregiver will verbalize understanding of skin care regimen Date Initiated: 11/30/2021 Target Resolution Date: 12/30/2021 Goal Status: Active Ulcer/skin breakdown will have  a volume reduction of 30% by week 4 Date Initiated: 11/30/2021 Target Resolution Date: 01/30/2022 Goal Status: Active Ulcer/skin breakdown will have a volume reduction of 50% by week 8 Date Initiated: 11/30/2021 Target Resolution Date: 03/02/2022 Goal Status: Active Ulcer/skin breakdown will have a volume reduction of 80% by week 12 Date Initiated: 11/30/2021 Target Resolution Date: 04/01/2022 Goal Status: Active Ulcer/skin breakdown will heal within 14 weeks Date Initiated: 11/30/2021 Target Resolution Date: 05/02/2022 Goal Status: Active Interventions: Assess patient/caregiver ability to obtain necessary supplies Assess patient/caregiver ability to perform ulcer/skin care regimen upon admission and as needed Assess ulceration(s) every  visit Notes: Electronic Signature(s) Unsigned Entered By: Massie Kluver on 02/08/2022 11:09:28 Signature(s): Date(s): [name] ([id number]) -------------------------------------------------------------------------------- Pain Assessment Details Patient Name: KEMIYAH, TARAZON. Date of Service: 02/08/2022 10:30 AM Medical Record Number: [id number] Patient Account Number: [id number] Date of Birth/Sex: April 29, 1951 (71 y.o. F) Treating RN: Cornell Barman Primary Care Bekim Werntz: [name] Other Clinician: Massie Kluver Referring Jaloni Sorber: [name] Treating Lora Chavers/Extender: Skipper Cliche in Treatment: 10 Active Problems Location of Pain Severity and Description of Pain Patient Has Paino No Site Locations Pain Management and Medication Current Pain Management: Electronic Signature(s) Unsigned Entered By: Massie Kluver on 02/08/2022 10:56:07 Signature(s): Date(s): [name] ([id number]) -------------------------------------------------------------------------------- Patient/Caregiver Education Details Patient Name: [name]. Date of Service: 02/08/2022 10:30 AM Medical Record Number: [id number] Patient Account Number: [id number] Date of Birth/Gender: 29-Dec-1950 (71 y.o. F) Treating RN: Cornell Barman Primary Care Physician: [name] Other Clinician: Massie Kluver Referring Physician: [name] Treating Physician/Extender: Skipper Cliche in Treatment: 10 Education Assessment Education Provided To: Patient Education Topics Provided Wound/Skin Impairment: Handouts: Other: continue wound care as directed Methods: Explain/Verbal Responses: State content correctly Electronic Signature(s) Unsigned Entered By: Massie Kluver on 02/08/2022 11:36:17 Signature(s): Date(s): [name] ([id number]) -------------------------------------------------------------------------------- Wound Assessment Details Patient Name: EMUNAH, TEXIDOR. Date of  Service: 02/08/2022 10:30 AM Medical Record Number: [id number] Patient Account Number: [id number] Date of Birth/Sex: February 04, 1951 (71 y.o. F) Treating RN: Cornell Barman Primary Care Mariah Harn: [name] Other Clinician: Massie Kluver Referring Garrit Marrow: [name] Treating Shayn Madole/Extender: Skipper Cliche in Treatment: 10 Wound Status Wound Number: 2 Primary Etiology: Venous Leg Ulcer Wound Location: Right, Lateral Lower Leg Wound Status: Open Wounding Event: Gradually Appeared Comorbid History: Hypertension, Peripheral Arterial Disease Date Acquired: 07/08/2021 Weeks Of Treatment: 10 Clustered Wound: No Photos Wound Measurements Length: (cm) 6 Width: (cm) 1 Depth: (cm) 0.3 Area: (cm) 4.712 Volume: (cm) 1.414 % Reduction in Area: 46.4% % Reduction in Volume: 59.8% Epithelialization: None Tunneling: No Undermining: No Wound Description Classification: Full Thickness Without Exposed Support Structures Exudate Amount: Medium Exudate Type: Serosanguineous Exudate Color: red, brown Foul Odor After Cleansing: No Slough/Fibrino Yes Wound Bed Granulation Amount: Large (67-100%) Exposed Structure Granulation Quality: Red Fascia Exposed: No Necrotic Amount: Small (1-33%) Fat Layer (Subcutaneous Tissue) Exposed: Yes Necrotic Quality: Adherent Slough Tendon Exposed: No Muscle Exposed: No Joint Exposed: No Bone Exposed: No Treatment Notes Wound #2 (Lower Leg) Wound Laterality: Right, Lateral Cleanser Hibiclens, 16 (oz) Discharge Instruction: Put Hibiclenso on your skin and rub it in gently for five minutes with a washcloth. Turn the water back on and rinse very well with warm water. Do not use your regular soap after using and rinsing Hibiclenso. Pat yourself dry with a clean towel. Peri-Wound Care JAYLEANA, COLBERG ([id number]) Topical Primary Dressing Silvercel 4 1/4x 4 1/4 (in/in) Discharge Instruction: Apply Silvercel 4 1/4x 4 1/4 (in/in) as instructed Secondary  Dressing ABD Pad 5x9 (  in/in) Discharge Instruction: Cover with ABD pad Secured With [place] H Soft Cloth Surgical Tape, 2x2 (in/yd) Conform 4'' - Conforming Stretch Gauze Bandage 4x75 (in/in) Discharge Instruction: Apply as directed Tubigrip Size D, 3x10 (in/yd) Compression Wrap Compression Stockings Add-Ons Electronic Signature(s) Unsigned Entered By: Massie Kluver on 02/08/2022 11:05:25 Signature(s): Date(s): [name] ([id number]) -------------------------------------------------------------------------------- Wound Assessment Details Patient Name: NEDDA, GAINS. Date of Service: 02/08/2022 10:30 AM Medical Record Number: [id number] Patient Account Number: [id number] Date of Birth/Sex: 07-20-1950 (71 y.o. F) Treating RN: Cornell Barman Primary Care Elizette Shek: [name] Other Clinician: Massie Kluver Referring Tylerjames Hoglund: [name] Treating Graycie Halley/Extender: Skipper Cliche in Treatment: 10 Wound Status Wound Number: 4 Primary Etiology: Trauma, Other Wound Location: Left Forearm Wound Status: Healed - Epithelialized Wounding Event: Skin Tear/Laceration Date Acquired: 01/18/2022 Weeks Of Treatment: 2 Clustered Wound: No Wound Measurements Length: (cm) Width: (cm) Depth: (cm) Area: (cm) Volume: (cm) 0 % Reduction in Area: 100% 0 % Reduction in Volume: 100% 0 0 0 Wound Description Classification: Full Thickness Without Exposed Support Structu Exudate Amount: Medium Exudate Type: Serosanguineous Exudate Color: red, brown res Treatment Notes Wound #4 (Forearm) Wound Laterality: Left Cleanser Peri-Wound Care Topical Primary Dressing Secondary Dressing Secured With Compression Wrap Compression Stockings Add-Ons Electronic Signature(s) Unsigned Entered ByMassie Kluver on 02/08/2022 11:05:44 Signature(s): Date(s): [name]  ([id number]) -------------------------------------------------------------------------------- Wound Assessment Details Patient Name: RIELLE, SCHLAUCH. Date of Service: 02/08/2022 10:30 AM Medical Record Number: [id number] Patient Account Number: [id number] Date of Birth/Sex: 07/01/1950 (71 y.o. F) Treating RN: Cornell Barman Primary Care Jacobs Golab: [name] Other Clinician: Massie Kluver Referring Durelle Zepeda: [name] Treating Katherine Syme/Extender: Skipper Cliche in Treatment: 10 Wound Status Wound Number: 5 Primary Etiology: Inflammatory Wound Location: Right, Lateral Toe Fifth Wound Status: Open Wounding Event: Blister Comorbid History: Hypertension, Peripheral Arterial Disease Date Acquired: 02/05/2022 Weeks Of Treatment: 0 Clustered Wound: No Photos Wound Measurements Length: (cm) 0.5 Width: (cm) 0.5 Depth: (cm) 0.1 Area: (cm) 0.196 Volume: (cm) 0.02 % Reduction in Area: % Reduction in Volume: Epithelialization: None Tunneling: No Undermining: No Wound Description Classification: Full Thickness Without Exposed Support Structures Exudate Amount: Medium Exudate Type: Serosanguineous Exudate Color: red, brown Foul Odor After Cleansing: No Slough/Fibrino Yes Wound Bed Granulation Amount: None Present (0%) Exposed Structure Necrotic Amount: Small (1-33%) Fascia Exposed: No Necrotic Quality: Adherent Slough Fat Layer (Subcutaneous Tissue) Exposed: Yes Tendon Exposed: No Muscle Exposed: No Joint Exposed: No Bone Exposed: No Treatment Notes Wound #5 (Toe Fifth) Wound Laterality: Right, Lateral Cleanser Peri-Wound Care Topical Primary Dressing LOVINA, ZUVER ([id number]) Secondary Dressing Secured With Compression Wrap Compression Stockings Add-Ons Electronic Signature(s) Unsigned Entered By: Massie Kluver on 02/08/2022 11:08:04 Signature(s): Date(s): [name]  ([id number]) -------------------------------------------------------------------------------- Vitals Details Patient Name: [name]. Date of Service: 02/08/2022 10:30 AM Medical Record Number: [id number] Patient Account Number: [id number] Date of Birth/Sex: 05/20/1951 (71 y.o. F) Treating RN: Cornell Barman Primary Care Catherene Kaleta: [name] Other Clinician: Massie Kluver Referring Matilde Markie: [name] Treating Levita Monical/Extender: Skipper Cliche in Treatment: 10 Vital Signs Time Taken: 10:54 Temperature (F): 98.5 Height (in): 63 Pulse (bpm): 82 Weight (lbs): 147 Respiratory Rate (breaths/min): 16 Body Mass Index (BMI): 26 Blood Pressure (mmHg): 127/77 Reference Range: 80 - 120 mg / dl Electronic Signature(s) Unsigned Entered ByMassie Kluver on 02/08/2022 10:56:03 Signature(s): Date(s):

## 2022-02-10 DIAGNOSIS — I739 Peripheral vascular disease, unspecified: Secondary | ICD-10-CM | POA: Diagnosis not present

## 2022-02-10 DIAGNOSIS — R7303 Prediabetes: Secondary | ICD-10-CM | POA: Diagnosis not present

## 2022-02-10 DIAGNOSIS — M1990 Unspecified osteoarthritis, unspecified site: Secondary | ICD-10-CM | POA: Diagnosis not present

## 2022-02-10 DIAGNOSIS — K219 Gastro-esophageal reflux disease without esophagitis: Secondary | ICD-10-CM | POA: Diagnosis not present

## 2022-02-10 DIAGNOSIS — G4700 Insomnia, unspecified: Secondary | ICD-10-CM | POA: Diagnosis not present

## 2022-02-10 DIAGNOSIS — R600 Localized edema: Secondary | ICD-10-CM | POA: Diagnosis not present

## 2022-02-10 DIAGNOSIS — L02611 Cutaneous abscess of right foot: Secondary | ICD-10-CM | POA: Diagnosis not present

## 2022-02-10 DIAGNOSIS — J309 Allergic rhinitis, unspecified: Secondary | ICD-10-CM | POA: Diagnosis not present

## 2022-02-10 DIAGNOSIS — R519 Headache, unspecified: Secondary | ICD-10-CM | POA: Diagnosis not present

## 2022-02-14 ENCOUNTER — Ambulatory Visit: Payer: Medicare HMO | Admitting: Podiatry

## 2022-02-21 ENCOUNTER — Encounter: Payer: Medicare HMO | Admitting: Physician Assistant

## 2022-02-21 DIAGNOSIS — S51812A Laceration without foreign body of left forearm, initial encounter: Secondary | ICD-10-CM | POA: Diagnosis not present

## 2022-02-21 DIAGNOSIS — I10 Essential (primary) hypertension: Secondary | ICD-10-CM | POA: Diagnosis not present

## 2022-02-21 DIAGNOSIS — L97812 Non-pressure chronic ulcer of other part of right lower leg with fat layer exposed: Secondary | ICD-10-CM | POA: Diagnosis not present

## 2022-02-21 DIAGNOSIS — L03115 Cellulitis of right lower limb: Secondary | ICD-10-CM | POA: Diagnosis not present

## 2022-02-21 DIAGNOSIS — I872 Venous insufficiency (chronic) (peripheral): Secondary | ICD-10-CM | POA: Diagnosis not present

## 2022-02-21 NOTE — Progress Notes (Signed)
LADY, WISHAM ([id number]) Visit Report for 02/21/2022 Chief Complaint Document Details Patient Name: [name], [name]. Date of Service: 02/21/2022 10:30 AM Medical Record Number: [id number] Patient Account Number: [id number] Date of Birth/Sex: [birth year] (71 y.o. F) Treating RN: Carlene Coria Primary Care Provider: [name] Other Clinician: Massie Kluver Referring Provider: [name] Treating Provider/Extender: Skipper Cliche in Treatment: 11 Information Obtained from: Patient Chief Complaint Right LE Ulcers Electronic Signature(s) Signed: 02/21/2022 11:03:03 AM By: Worthy Keeler PA-C Entered By: Worthy Keeler on 02/21/2022 11:03:02 [name], [name] ([id number]) -------------------------------------------------------------------------------- Problem List Details Patient Name: [name]. Date of Service: 02/21/2022 10:30 AM Medical Record Number: [id number] Patient Account Number: [id number] Date of Birth/Sex: 05/10/1951 (71 y.o. F) Treating RN: Carlene Coria Primary Care Provider: [name] Other Clinician: Massie Kluver Referring Provider: [name] Treating Provider/Extender: Skipper Cliche in Treatment: 11 Active Problems ICD-10 Encounter Code Description Active Date MDM Diagnosis I87.331 Chronic venous hypertension (idiopathic) with ulcer and inflammation of 11/30/2021 No Yes right lower extremity L97.812 Non-pressure chronic ulcer of other part of right lower leg with fat layer 11/30/2021 No Yes exposed L03.115 Cellulitis of right lower limb 11/30/2021 No Yes S51.812A Laceration without foreign body of left forearm, initial encounter 01/25/2022 No Yes I73.89 Other specified peripheral vascular diseases 11/30/2021 No Yes Z89.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

## 2022-02-24 NOTE — Progress Notes (Signed)
SAYLEE, SHERRILL ([id number]) Visit Report for 02/21/2022 Arrival Information Details Patient Name: [name], [name]. Date of Service: 02/21/2022 10:30 AM Medical Record Number: [id number] Patient Account Number: [id number] Date of Birth/Sex: September 19, 1950 (71 y.o. F) Treating RN: Carlene Coria Primary Care Danay Mckellar: [name] Other Clinician: Massie Kluver Referring Harshika Mago: [name] Treating Candelaria Pies/Extender: Skipper Cliche in Treatment: 11 Visit Information History Since Last Visit All ordered tests and consults were completed: No Patient Arrived: Ambulatory Added or deleted any medications: No Arrival Time: 10:40 Any new allergies or adverse reactions: No Transfer Assistance: None Had a fall or experienced change in No Patient Requires Transmission-Based No activities of daily living that may affect Precautions: risk of falls: Patient Has Alerts: Yes Hospitalized since last visit: No Patient Alerts: Patient on Blood Pain Present Now: Yes Thinner Electronic Signature(s) Signed: 02/24/2022 11:41:41 AM By: Massie Kluver Entered By: Massie Kluver on 02/21/2022 10:46:20 [name], [name] ([id number]) -------------------------------------------------------------------------------- Clinic Level of Care Assessment Details Patient Name: [name]. Date of Service: 02/21/2022 10:30 AM Medical Record Number: [id number] Patient Account Number: [id number] Date of Birth/Sex: [birth year] (71 y.o. F) Treating RN: Carlene Coria Primary Care Ankush Gintz: [name] Other Clinician: Massie Kluver Referring Creedon Danielski: [name] Treating Luetta Piazza/Extender: Skipper Cliche in Treatment: 11 Clinic Level of Care Assessment Items TOOL 1 Quantity Score '[]'$  - Use when EandM and Procedure is performed on INITIAL visit 0 ASSESSMENTS - Nursing Assessment / Reassessment '[]'$  - General Physical Exam (combine w/ comprehensive assessment (listed just below) when performed on new 0 pt.  evals) '[]'$  - 0 Comprehensive Assessment (HX, ROS, Risk Assessments, Wounds Hx, etc.) ASSESSMENTS - Wound and Skin Assessment / Reassessment '[]'$  - Dermatologic / Skin Assessment (not related to wound area) 0 ASSESSMENTS - Ostomy and/or Continence Assessment and Care '[]'$  - Incontinence Assessment and Management 0 '[]'$  - 0 Ostomy Care Assessment and Management (repouching, etc.) PROCESS - Coordination of Care '[]'$  - Simple Patient / Family Education for ongoing care 0 '[]'$  - 0 Complex (extensive) Patient / Family Education for ongoing care '[]'$  - 0 Staff obtains Programmer, systems, Records, Test Results / Process Orders '[]'$  - 0 Staff telephones HHA, Nursing Homes / Clarify orders / etc '[]'$  - 0 Routine Transfer to another Facility (non-emergent condition) '[]'$  - 0 Routine Hospital Admission (non-emergent condition) '[]'$  - 0 New Admissions / Biomedical engineer / Ordering NPWT, Apligraf, etc. '[]'$  - 0 Emergency Hospital Admission (emergent condition) PROCESS - Special Needs '[]'$  - Pediatric / Minor Patient Management 0 '[]'$  - 0 Isolation Patient Management '[]'$  - 0 Hearing / Language / Visual special needs '[]'$  - 0 Assessment of Community assistance (transportation, D/C planning, etc.) '[]'$  - 0 Additional assistance / Altered mentation '[]'$  - 0 Support Surface(s) Assessment (bed, cushion, seat, etc.) INTERVENTIONS - Miscellaneous '[]'$  - External ear exam 0 '[]'$  - 0 Patient Transfer (multiple staff / Civil Service fast streamer / Similar devices) '[]'$  - 0 Simple Staple / Suture removal (25 or less) '[]'$  - 0 Complex Staple / Suture removal (26 or more) '[]'$  - 0 Hypo/Hyperglycemic Management (do not check if billed separately) '[]'$  - 0 Ankle / Brachial Index (ABI) - do not check if billed separately Has the patient been seen at the hospital within the last three years: Yes Total Score: 0 Level Of Care: ____ [name] ([id number]) Electronic Signature(s) Signed: 02/24/2022 11:41:41 AM By: Massie Kluver Entered By: Massie Kluver on 02/21/2022 11:08:30 [name], [name] ([id number]) -------------------------------------------------------------------------------- Encounter Discharge Information Details Patient Name: [name]. Date  of Service: 02/21/2022 10:30 AM Medical Record Number: [id number] Patient Account Number: [id number] Date of Birth/Sex: 1950-10-18 (71 y.o. F) Treating RN: Carlene Coria Primary Care Tasneem Cormier: [name] Other Clinician: Massie Kluver Referring Arabella Revelle: [name] Treating Kailena Lubas/Extender: Skipper Cliche in Treatment: 11 Encounter Discharge Information Items Post Procedure Vitals Discharge Condition: Stable Temperature (F): 97.9 Ambulatory Status: Ambulatory Pulse (bpm): 87 Discharge Destination: Home Respiratory Rate (breaths/min): 18 Transportation: Private Auto Blood Pressure (mmHg): 129/81 Accompanied By: self Schedule Follow-up Appointment: Yes Clinical Summary of Care: Electronic Signature(s) Signed: 02/24/2022 11:41:41 AM By: Massie Kluver Entered By: Massie Kluver on 02/21/2022 11:16:47 [name], [name] ([id number]) -------------------------------------------------------------------------------- Lower Extremity Assessment Details Patient Name: [name]. Date of Service: 02/21/2022 10:30 AM Medical Record Number: [id number] Patient Account Number: [id number] Date of Birth/Sex: [birth year] (71 y.o. F) Treating RN: Carlene Coria Primary Care Karrie Fluellen: [name] Other Clinician: Massie Kluver Referring Michon Kaczmarek: [name] Treating Yedidya Duddy/Extender: Skipper Cliche in Treatment: 11 Edema Assessment Assessed: [Left: No] [Right: Yes] Edema: [Left: Ye] [Right: s] Calf Left: Right: Point of Measurement: 32 cm From Medial Instep 34.5 cm Ankle Left: Right: Point of Measurement: 10 cm From Medial Instep 21 cm Vascular Assessment Pulses: Dorsalis Pedis Palpable: [Right:Yes] Electronic Signature(s) Signed: 02/21/2022 2:31:54 PM By: Carlene Coria RN Signed: 02/24/2022 11:41:41 AM By: Massie Kluver Entered By: Massie Kluver on 02/21/2022 10:58:54 [place], [name] ([id number]) -------------------------------------------------------------------------------- Multi Wound Chart Details Patient Name: [name]. Date of Service: 02/21/2022 10:30 AM Medical Record Number: [id number] Patient Account Number: [id number] Date of Birth/Sex: 1950/12/11 (71 y.o. F) Treating RN: Carlene Coria Primary Care Rilie Glanz: [name] Other Clinician: Massie Kluver Referring Rashan Patient: [name] Treating Maisen Klingler/Extender: Skipper Cliche in Treatment: 11 Vital Signs Height(in): 63 Pulse(bpm): 87 Weight(lbs): 147 Blood Pressure(mmHg): 129/81 Body Mass Index(BMI): 26 Temperature(F): 97.9 Respiratory Rate(breaths/min): 18 Photos: [N/A:N/A] Wound Location: Right, Lateral Lower Leg Right, Lateral Toe Fifth N/A Wounding Event: Gradually Appeared Blister N/A Primary Etiology: Venous Leg Ulcer Inflammatory N/A Comorbid History: Hypertension, Peripheral Arterial Hypertension, Peripheral Arterial N/A Disease Disease Date Acquired: 07/08/2021 02/05/2022 N/A Weeks of Treatment: 11 1 N/A Wound Status: Open Open N/A Wound Recurrence: No No N/A Measurements L x W x D (cm) 5x0.8x0.3 0.1x0.1x0.1 N/A Area (cm) : 3.142 0.008 N/A Volume (cm) : 0.942 0.001 N/A % Reduction in Area: 64.30% 95.90% N/A % Reduction in Volume: 73.20% 95.00% N/A Classification: Full Thickness Without Exposed Full Thickness Without Exposed N/A Support Structures Support Structures Exudate Amount: Medium Medium N/A Exudate Type: Serosanguineous Serosanguineous N/A Exudate Color: red, brown red, brown N/A Granulation Amount: Large (67-100%) None Present (0%) N/A Granulation Quality: Red N/A N/A Necrotic Amount: Small (1-33%) Small (1-33%) N/A Exposed Structures: Fat Layer (Subcutaneous Tissue): Fat Layer (Subcutaneous Tissue): N/A Yes Yes Fascia: No Fascia:  No Tendon: No Tendon: No Muscle: No Muscle: No Joint: No Joint: No Bone: No Bone: No Epithelialization: None None N/A Treatment Notes Electronic Signature(s) Signed: 02/24/2022 11:41:41 AM By: Massie Kluver Entered By: Massie Kluver on 02/21/2022 10:59:06 Lainez, [name] ([id number]) -------------------------------------------------------------------------------- [place] Details Patient Name: [name]. Date of Service: 02/21/2022 10:30 AM Medical Record Number: [id number] Patient Account Number: [id number] Date of Birth/Sex: [birth year] (71 y.o. F) Treating RN: Carlene Coria Primary Care Iyonna Rish: [name] Other Clinician: Massie Kluver Referring Fischer Halley: [name] Treating Brittin Janik/Extender: Skipper Cliche in Treatment: 11 Active Inactive Wound/Skin Impairment Nursing Diagnoses: Knowledge deficit related to ulceration/compromised skin integrity Goals: Patient/caregiver will verbalize understanding of skin care regimen Date Initiated:  11/30/2021 Target Resolution Date: 12/30/2021 Goal Status: Active Ulcer/skin breakdown will have a volume reduction of 30% by week 4 Date Initiated: 11/30/2021 Target Resolution Date: 01/30/2022 Goal Status: Active Ulcer/skin breakdown will have a volume reduction of 50% by week 8 Date Initiated: 11/30/2021 Target Resolution Date: 03/02/2022 Goal Status: Active Ulcer/skin breakdown will have a volume reduction of 80% by week 12 Date Initiated: 11/30/2021 Target Resolution Date: 04/01/2022 Goal Status: Active Ulcer/skin breakdown will heal within 14 weeks Date Initiated: 11/30/2021 Target Resolution Date: 05/02/2022 Goal Status: Active Interventions: Assess patient/caregiver ability to obtain necessary supplies Assess patient/caregiver ability to perform ulcer/skin care regimen upon admission and as needed Assess ulceration(s) every visit Notes: Electronic Signature(s) Signed: 02/21/2022 2:31:54 PM By:  Carlene Coria RN Signed: 02/24/2022 11:41:41 AM By: Massie Kluver Entered By: Massie Kluver on 02/21/2022 10:59:00 Ishii, [name] ([id number]) -------------------------------------------------------------------------------- Pain Assessment Details Patient Name: [name]. Date of Service: 02/21/2022 10:30 AM Medical Record Number: [id number] Patient Account Number: [id number] Date of Birth/Sex: 09/02/1950 (71 y.o. F) Treating RN: Carlene Coria Primary Care Cecile Guevara: [name] Other Clinician: Massie Kluver Referring Anwen Cannedy: [name] Treating Alayza Pieper/Extender: Skipper Cliche in Treatment: 11 Active Problems Location of Pain Severity and Description of Pain Patient Has Paino Yes Site Locations Pain Location: Pain in Ulcers Duration of the Pain. Constant / Intermittento Constant Rate the pain. Current Pain Level: 7 Character of Pain Describe the Pain: Aching, Burning, Throbbing Pain Management and Medication Current Pain Management: Medication: Yes Rest: Yes Electronic Signature(s) Signed: 02/21/2022 2:31:54 PM By: Carlene Coria RN Signed: 02/24/2022 11:41:41 AM By: Massie Kluver Entered By: Massie Kluver on 02/21/2022 10:49:50 Ewen, [name] ([id number]) -------------------------------------------------------------------------------- Patient/Caregiver Education Details Patient Name: [name]. Date of Service: 02/21/2022 10:30 AM Medical Record Number: [id number] Patient Account Number: [id number] Date of Birth/Gender: 1950-07-03 (71 y.o. F) Treating RN: Carlene Coria Primary Care Physician: [name] Other Clinician: Massie Kluver Referring Physician: [name] Treating Physician/Extender: Skipper Cliche in Treatment: 11 Education Assessment Education Provided To: Patient Education Topics Provided Wound/Skin Impairment: Handouts: Other: continue wound care as directed Methods: Explain/Verbal Responses: State content  correctly Electronic Signature(s) Signed: 02/24/2022 11:41:41 AM By: Massie Kluver Entered By: Massie Kluver on 02/21/2022 11:08:54 [place], [name] ([id number]) -------------------------------------------------------------------------------- Wound Assessment Details Patient Name: [name]. Date of Service: 02/21/2022 10:30 AM Medical Record Number: [id number] Patient Account Number: [id number] Date of Birth/Sex: 06-Feb-1951 (71 y.o. F) Treating RN: Carlene Coria Primary Care Burnice Oestreicher: [name] Other Clinician: Massie Kluver Referring Eryn Krejci: [name] Treating Mollyann Halbert/Extender: Skipper Cliche in Treatment: 11 Wound Status Wound Number: 2 Primary Etiology: Venous Leg Ulcer Wound Location: Right, Lateral Lower Leg Wound Status: Open Wounding Event: Gradually Appeared Comorbid History: Hypertension, Peripheral Arterial Disease Date Acquired: 07/08/2021 Weeks Of Treatment: 11 Clustered Wound: No Photos Wound Measurements Length: (cm) 5 Width: (cm) 0.8 Depth: (cm) 0.3 Area: (cm) 3.142 Volume: (cm) 0.942 % Reduction in Area: 64.3% % Reduction in Volume: 73.2% Epithelialization: None Wound Description Classification: Full Thickness Without Exposed Support Structures Exudate Amount: Medium Exudate Type: Serosanguineous Exudate Color: red, brown Foul Odor After Cleansing: No Slough/Fibrino Yes Wound Bed Granulation Amount: Large (67-100%) Exposed Structure Granulation Quality: Red Fascia Exposed: No Necrotic Amount: Small (1-33%) Fat Layer (Subcutaneous Tissue) Exposed: Yes Necrotic Quality: Adherent Slough Tendon Exposed: No Muscle Exposed: No Joint Exposed: No Bone Exposed: No Treatment Notes Wound #2 (Lower Leg) Wound Laterality: Right, Lateral Cleanser Hibiclens, 16 (oz) Discharge Instruction: Put Hibiclenso on your skin and rub it in  gently for five minutes with a washcloth. Turn the water back on and rinse very well with warm water. Do not use  your regular soap after using and rinsing Hibiclenso. Pat yourself dry with a clean towel. Peri-Wound Care TRAMEKA, DOROUGH ([id number]) Topical Primary Dressing Silvercel 4 1/4x 4 1/4 (in/in) Discharge Instruction: Apply Silvercel 4 1/4x 4 1/4 (in/in) as instructed Secondary Dressing ABD Pad 5x9 (in/in) Discharge Instruction: Cover with ABD pad Secured With [employer] Tape, 2x2 (in/yd) Conform 4'' - Conforming Stretch Gauze Bandage 4x75 (in/in) Discharge Instruction: Apply as directed Tubigrip Size D, 3x10 (in/yd) Compression Wrap Compression Stockings Add-Ons Electronic Signature(s) Signed: 02/21/2022 2:31:54 PM By: Carlene Coria RN Signed: 02/24/2022 11:41:41 AM By: Massie Kluver Entered By: Massie Kluver on 02/21/2022 10:57:49 [place], [name] ([id number]) -------------------------------------------------------------------------------- Wound Assessment Details Patient Name: [name]. Date of Service: 02/21/2022 10:30 AM Medical Record Number: [id number] Patient Account Number: [id number] Date of Birth/Sex: [birth year] (71 y.o. F) Treating RN: Carlene Coria Primary Care Chiyeko Ferre: [name] Other Clinician: Massie Kluver Referring Aireona Torelli: [name] Treating Annastacia Duba/Extender: Skipper Cliche in Treatment: 11 Wound Status Wound Number: 5 Primary Etiology: Inflammatory Wound Location: Right, Lateral Toe Fifth Wound Status: Healed - Epithelialized Wounding Event: Blister Comorbid History: Hypertension, Peripheral Arterial Disease Date Acquired: 02/05/2022 Weeks Of Treatment: 1 Clustered Wound: No Photos Wound Measurements Length: (cm) 0 Width: (cm) 0 Depth: (cm) 0 Area: (cm) Volume: (cm) % Reduction in Area: 100% % Reduction in Volume: 100% Epithelialization: None 0 0 Wound Description Classification: Full Thickness Without Exposed Support Structures Exudate Amount: Medium Exudate Type:  Serosanguineous Exudate Color: red, brown Foul Odor After Cleansing: No Slough/Fibrino Yes Wound Bed Granulation Amount: None Present (0%) Exposed Structure Necrotic Amount: Small (1-33%) Fascia Exposed: No Fat Layer (Subcutaneous Tissue) Exposed: Yes Tendon Exposed: No Muscle Exposed: No Joint Exposed: No Bone Exposed: No Treatment Notes Wound #5 (Toe Fifth) Wound Laterality: Right, Lateral Cleanser Peri-Wound Care Topical Primary Dressing ANEISHA, SKYLES ([id number]) Secondary Dressing Secured With Compression Wrap Compression Stockings Add-Ons Electronic Signature(s) Signed: 02/21/2022 2:31:54 PM By: Carlene Coria RN Signed: 02/24/2022 11:41:41 AM By: Massie Kluver Entered By: Massie Kluver on 02/21/2022 11:05:05 Yawn, [name] ([id number]) -------------------------------------------------------------------------------- Vitals Details Patient Name: [name]. Date of Service: 02/21/2022 10:30 AM Medical Record Number: [id number] Patient Account Number: [id number] Date of Birth/Sex: September 23, 1950 (71 y.o. F) Treating RN: Carlene Coria Primary Care Teion Ballin: [name] Other Clinician: Massie Kluver Referring Orville Widmann: [name] Treating Jeffren Dombek/Extender: Skipper Cliche in Treatment: 11 Vital Signs Time Taken: 10:47 Temperature (F): 97.9 Height (in): 63 Pulse (bpm): 87 Weight (lbs): 147 Respiratory Rate (breaths/min): 18 Body Mass Index (BMI): 26 Blood Pressure (mmHg): 129/81 Reference Range: 80 - 120 mg / dl Electronic Signature(s) Signed: 02/24/2022 11:41:41 AM By: Massie Kluver Entered By: Massie Kluver on 02/21/2022 10:49:33

## 2022-03-07 ENCOUNTER — Encounter: Payer: Medicare HMO | Attending: Physician Assistant | Admitting: Physician Assistant

## 2022-03-07 DIAGNOSIS — L97822 Non-pressure chronic ulcer of other part of left lower leg with fat layer exposed: Secondary | ICD-10-CM | POA: Diagnosis present

## 2022-03-07 DIAGNOSIS — I10 Essential (primary) hypertension: Secondary | ICD-10-CM | POA: Diagnosis not present

## 2022-03-07 DIAGNOSIS — L97812 Non-pressure chronic ulcer of other part of right lower leg with fat layer exposed: Secondary | ICD-10-CM | POA: Diagnosis not present

## 2022-03-07 DIAGNOSIS — L03115 Cellulitis of right lower limb: Secondary | ICD-10-CM | POA: Insufficient documentation

## 2022-03-07 DIAGNOSIS — Z89421 Acquired absence of other right toe(s): Secondary | ICD-10-CM | POA: Diagnosis not present

## 2022-03-07 DIAGNOSIS — S51812A Laceration without foreign body of left forearm, initial encounter: Secondary | ICD-10-CM | POA: Diagnosis not present

## 2022-03-07 DIAGNOSIS — I87333 Chronic venous hypertension (idiopathic) with ulcer and inflammation of bilateral lower extremity: Secondary | ICD-10-CM | POA: Diagnosis not present

## 2022-03-07 NOTE — Progress Notes (Addendum)
[name], [name] ([id number]) Visit Report for 03/07/2022 Chief Complaint Document Details Patient Name: [name], [name]. Date of Service: 03/07/2022 10:30 AM Medical Record Number: [id number] Patient Account Number: [id number] Date of Birth/Sex: 21-Oct-1950 (71 y.o. F) Treating RN: Cornell Barman Primary Care Provider: [name] Other Clinician: Massie Kluver Referring Provider: [name] Treating Provider/Extender: Skipper Cliche in Treatment: 13 Information Obtained from: Patient Chief Complaint Bilateral LE Ulcers Electronic Signature(s) Signed: 03/07/2022 11:01:58 AM By: Worthy Keeler PA-C Previous Signature: 03/07/2022 10:53:10 AM Version By: Worthy Keeler PA-C Entered By: Worthy Keeler on 03/07/2022 11:01:58 [name], [name] ([id number]) -------------------------------------------------------------------------------- Debridement Details Patient Name: [name]. Date of Service: 03/07/2022 10:30 AM Medical Record Number: [id number] Patient Account Number: [id number] Date of Birth/Sex: Jul 29, 1950 (71 y.o. F) Treating RN: Cornell Barman Primary Care Provider: [name] Other Clinician: Massie Kluver Referring Provider: [name] Treating Provider/Extender: Skipper Cliche in Treatment: 13 Debridement Performed for Wound #2 Right,Lateral Lower Leg Assessment: Performed By: Physician Tommie Sams., PA-C Debridement Type: Debridement Severity of Tissue Pre Debridement: Fat layer exposed Level of Consciousness (Pre- Awake and Alert procedure): Pre-procedure Verification/Time Out Yes - 10:56 Taken: Start Time: 10:56 Total Area Debrided (L x W): 5 (cm) x 0.9 (cm) = 4.5 (cm) Tissue and other material Viable, Non-Viable, Slough, Subcutaneous, Slough, Other: calcium deposit debrided: Level: Skin/Subcutaneous Tissue Debridement Description: Excisional Instrument: Curette Bleeding: Minimum Hemostasis Achieved: Pressure End Time: 11:00 Response to  Treatment: Procedure was tolerated well Level of Consciousness (Post- Awake and Alert procedure): Post Debridement Measurements of Total Wound Length: (cm) 5 Width: (cm) 0.9 Depth: (cm) 0.3 Volume: (cm) 1.06 Character of Wound/Ulcer Post Debridement: Stable Severity of Tissue Post Debridement: Fat layer exposed Post Procedure Diagnosis Same as Pre-procedure Electronic Signature(s) Signed: 03/07/2022 1:10:05 PM By: Gretta Cool, BSN, RN, CWS, [name] RN, BSN Signed: 03/07/2022 5:04:14 PM By: Massie Kluver Signed: 03/07/2022 5:13:16 PM By: Worthy Keeler PA-C Entered By: Massie Kluver on 03/07/2022 11:00:11 [name], [name] ([id number]) -------------------------------------------------------------------------------- HPI Details Patient Name: [name], [name]. Date of Service: 03/07/2022 10:30 AM Medical Record Number: [id number] Patient Account Number: [id number] Date of Birth/Sex: [birth year] (71 y.o. F) Treating RN: Cornell Barman Primary Care Provider: [name] Other Clinician: Massie Kluver Referring Provider: [name] Treating Provider/Extender: Skipper Cliche in Treatment: 13 History of Present Illness HPI Description: 11-30-2021 upon evaluation today patient presents for initial inspection here in our clinic concerning wounds that she has over the right lateral leg as well as the right medial leg. Subsequently the patient is dealing with lower extremity edema which I do believe has been one of the big causes of what is going on here. She also does have what appears to be cellulitis of the leg as well as peripheral vascular disease. She already has amputation of a toe on the right foot. With that being said I did review that surgical pathology from the amputation this was the right third toe which showed a gangrenous ulcer with underlying cellulitis negative for acute osteomyelitis but acute cellulitis was present in the proximal margin. There was also noted on this pathology report a  biopsy from the right leg which showed a gangrenous ulcer with underlying cellulitis and foreign body type giant cell reaction. That was performed on 11-11-2021 and resulted on 11-15-2021 She is also had lower extremity arterial duplex studies which did show that she had falsely elevated ABI on the right with a TBI of 0.45. On the left she had an ABI  of 1.05 with a TBI of 0.12. She is pretty much monophasic throughout. With that being said there was definite reason for concern with regard to her blood flow into the legs she does have a follow-up with the vascular surgeon coming up shortly as well. 12-14-2021 upon evaluation today patient appears to be doing decently well currently in regard to her wound. We are definitely seeing some signs of improvement there is definitely some necrotic tissue as well but try to clean some this away carefully today. Fortunately I think that she is going to tolerate the debridement without complication she otherwise seems to be healing excellent. I think get a lot of the dead tissue clearway will make a big difference as far as her healing is concerned. 12-21-2021 upon evaluation today patient appears to be doing well currently in regard to her legs she has been using the Hibiclens which she tells me seems to be helping. She tells me the leg feels much better washing with this compared to what it was previous which is great news. Fortunately I do not see any signs of active infection and I am overall extremely pleased with where we stand. I do think we are on the right track here. 01-04-2022 upon evaluation today patient appears to be doing unfortunately somewhat worse in regard to the new pustule areas that are appearing on her lower extremity. Again I have discussed with her based on what I am seeing that though she does have a wound I do not feel like these new pustule areas are related to the wound in particular. Again I am unsure of exactly what is going on and I really  think she would benefit from seeing a dermatology specialist. She voiced understanding and is not opposed to this she did see a dermatologist in [place] but they were under the impression they were just to do a biopsy which she had already had and therefore really did not do much she tells me that so I really know about that encounter. With that being said in general I do feel like that the patient is doing better although that is more in relation to the wound I think these new pustule areas still have me concerned. I do think she could be a candidate for topical antibiotics such as [employer] but at the same time I do think that we need to continue to monitor for any signs of overall worsening infection in the interim. 01-11-2022 upon evaluation today patient appears to be doing well currently in regard to her wounds nothing seems to be doing worse although she continues to have issues with multiple pustules forming I am really not certain that this is actually infection. I am beginning to suspect something rheumatologic/autoimmune. With that being said she did see dermatology today and it was felt by the dermatologist that this is likely infectious despite the fact we had a negative culture from when I saw her last week. They did repeat a culture on one of the pustules. 01-25-2022 upon evaluation today patient appears to be doing better currently in regard to her wounds in general. Everything is showing signs of improvement which is great news. Fortunately I see no evidence of active infection locally or systemically at this time which is great news. She does have a new skin tear on her forearm but this she did try to pull back over and has done quite well. 02-08-2022 upon evaluation today patient appears to be doing excellent currently in regard to  her wound. She has been tolerating the dressing changes without complication and again she still seems to be making progress. Despite this she still has a lot  of blisters that keep popping up again I have cultured them and they have been cultured twice other once at dermatology once I believe with primary care never have we identified any causative organisms. I am beginning to believe in fact have made a referral to Dr. Posey Pronto, rheumatology, in order to see if there is some other type of inflammatory process they were dealing with here. I think that this is becoming increasingly likely. 02-21-2022 upon evaluation today patient appears to be doing well currently in regard to her wounds. In fact everything is showing signs of improvement which is good news she has found some compression that she was interested in that seems to have been doing extremely well at this point. I feel like that may be part of what is improving the here overall. 03-07-2022 upon evaluation today patient actually is making some good progress here in regard to her right leg I am very pleased in this regard. With that being said she does have a wound on her left leg which does appear to be a venous leg ulcer that occurred as a result of a blister that arose and then burst over the past week. Fortunately there does not appear to be any evidence of active infection at this time which is great news. No fevers, chills, nausea, vomiting, or diarrhea. Electronic Signature(s) Signed: 03/07/2022 11:02:07 AM By: Worthy Keeler PA-C Entered By: Worthy Keeler on 03/07/2022 11:02:07 [place], [name] ([id number]) -------------------------------------------------------------------------------- Physical Exam Details Patient Name: [name] Date of Service: 03/07/2022 10:30 AM Medical Record Number: [id number] Patient Account Number: [id number] Date of Birth/Sex: 01/21/1951 (71 y.o. F) Treating RN: Cornell Barman Primary Care Provider: [name] Other Clinician: Massie Kluver Referring Provider: [name] Treating Provider/Extender: Skipper Cliche in Treatment:  61 Constitutional Well-nourished and well-hydrated in no acute distress. Respiratory normal breathing without difficulty. Psychiatric this patient is able to make decisions and demonstrates good insight into disease process. Alert and Oriented x 3. pleasant and cooperative. Notes Upon inspection patient's wound bed actually showed signs of good granulation and epithelization at this point. I did perform debridement on the right leg to clearway some of the necrotic debris she tolerated that today without complication. Postdebridement patient's wound bed actually seems to be doing much better which is great news. Electronic Signature(s) Signed: 03/07/2022 11:02:24 AM By: Worthy Keeler PA-C Entered By: Worthy Keeler on 03/07/2022 11:02:23 [name], [name] ([id number]) -------------------------------------------------------------------------------- Physician Orders Details Patient Name: [name]. Date of Service: 03/07/2022 10:30 AM Medical Record Number: [id number] Patient Account Number: [id number] Date of Birth/Sex: [birth year] (71 y.o. 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 Bathing/ Shower/ Hygiene o May shower; gently cleanse wound with antibacterial soap, rinse and pat dry prior to dressing  wounds -  Hibiclens recommended for washing right lower leg prior to dressing changes. You may pick this up at a pharmacy or purchase on [place] o No tub bath. Edema Control - Lymphedema / Segmental Compressive Device / Other o Tubigrip double layer applied - Tubi D double layer o Elevate, Exercise Daily and Avoid Standing for Long Periods of Time. o Elevate legs to the level of the heart and pump ankles as often as possible o Elevate leg(s) parallel to the floor when sitting. Wound Treatment Wound #2 - Lower Leg Wound Laterality: Right, Lateral Cleanser: Hibiclens, 16 (oz) 3 x Per Week/30 Days Discharge Instructions: Put Hibiclenso on your skin and rub it in gently for five minutes with a washcloth. Turn the water back on and rinse very well with warm water. Do not use your regular soap after using and rinsing Hibiclenso. Pat yourself dry with a clean towel. Primary Dressing: Silvercel 4 1/4x 4 1/4 (in/in) 3 x Per Week/30 Days Discharge Instructions: Apply Silvercel 4 1/4x 4 1/4 (in/in) as instructed Secondary Dressing: ABD Pad 5x9 (in/in) 3 x Per Week/30 Days Discharge Instructions: Cover with ABD pad Secured With: Medipore Tape - 38M Medipore H Soft Cloth Surgical Tape, 2x2 (in/yd) 3 x Per Week/30 Days Secured With: Conform 4'' - Conforming Stretch Gauze Bandage 4x75 (in/in) 3 x Per Week/30 Days Discharge Instructions: Apply as directed Secured With: Tubigrip Size D, 3x10 (in/yd) 3 x Per Week/30 Days Wound #6 - Lower Leg Wound Laterality: Left, Anterior Cleanser: Hibiclens, 16 (oz) 3 x Per Week/30 Days Discharge Instructions: Put Hibiclenso on your skin and rub it in gently for five minutes with a washcloth. Turn the water back on and rinse very well with warm water. Do not use your regular soap after using and rinsing Hibiclenso. Pat yourself dry with a clean towel. Primary Dressing: Silvercel 4 1/4x 4 1/4 (in/in) 3 x Per Week/30 Days Discharge Instructions: Apply Silvercel 4  1/4x 4 1/4 (in/in) as instructed Secondary Dressing: ABD Pad 5x9 (in/in) 3 x Per Week/30 Days Discharge Instructions: Cover with ABD pad [name], [name] ([id number]) Secured With: Nora Surgical Tape, 2x2 (in/yd) 3 x Per Week/30 Days Secured With: Conform 4'' - Conforming Stretch Gauze Bandage 4x75 (in/in) 3 x Per Week/30 Days Discharge Instructions: Apply as directed Secured With: Tubigrip Size D, 3x10 (in/yd) 3 x Per Week/30 Days Electronic Signature(s) Signed: 03/07/2022 5:04:14 PM By: Massie Kluver Signed: 03/07/2022 5:13:16 PM By: Worthy Keeler PA-C Entered By: Massie Kluver on 03/07/2022 11:01:09 [name], [name] ([id number]) -------------------------------------------------------------------------------- Problem List Details Patient Name: [name]. Date of Service: 03/07/2022 10:30 AM Medical Record Number: [id number] Patient Account Number: [id number] Date of Birth/Sex: [birth year] (71 y.o. F) Treating RN: Cornell Barman Primary Care Provider: [name] Other Clinician: Massie Kluver Referring Provider: [name] Treating Provider/Extender: Skipper Cliche in Treatment: 13 Active Problems ICD-10 Encounter Code Description Active Date MDM Diagnosis I87.333 Chronic venous hypertension (idiopathic) with ulcer and inflammation of 11/30/2021 No Yes bilateral lower extremity L97.812 Non-pressure chronic ulcer of other part of right lower leg with fat layer 11/30/2021 No Yes exposed L03.115 Cellulitis of right lower limb 11/30/2021 No Yes S51.812A Laceration without foreign body of left forearm, initial encounter 01/25/2022 No Yes L97.822 Non-pressure chronic ulcer of other part of left lower leg with fat layer 03/07/2022 No Yes exposed I73.89 Other specified peripheral vascular diseases 11/30/2021 No Yes Z89.421 Acquired absence of other right toe(s) 11/30/2021 No Yes Inactive Problems Resolved Problems  Electronic  Signature(s) Signed: 03/07/2022 11:01:48 AM By: Worthy Keeler PA-C Previous Signature: 03/07/2022 10:53:03 AM Version By: Worthy Keeler PA-C Entered By: Worthy Keeler on 03/07/2022 11:01:47 Sikkema, [name] ([id number]) -------------------------------------------------------------------------------- Progress Note Details Patient Name: [name]. Date of Service: 03/07/2022 10:30 AM Medical Record Number: [id number] Patient Account Number: [id number] Date of Birth/Sex: [birth year] (71 y.o. F) Treating RN: Cornell Barman Primary Care Provider: [name] Other Clinician: Massie Kluver Referring Provider: [name] Treating Provider/Extender: Skipper Cliche in Treatment: 13 Subjective Chief Complaint Information obtained from Patient Bilateral LE Ulcers History of Present Illness (HPI) 11-30-2021 upon evaluation today patient presents for initial inspection here in our clinic concerning wounds that she has over the right lateral leg as well as the right medial leg. Subsequently the patient is dealing with lower extremity edema which I do believe has been one of the big causes of what is going on here. She also does have what appears to be cellulitis of the leg as well as peripheral vascular disease. She already has amputation of a toe on the right foot. With that being said I did review that surgical pathology from the amputation this was the right third toe which showed a gangrenous ulcer with underlying cellulitis negative for acute osteomyelitis but acute cellulitis was present in the proximal margin. There was also noted on this pathology report a biopsy from the right leg which showed a gangrenous ulcer with underlying cellulitis and foreign body type giant cell reaction. That was performed on 11-11-2021 and resulted on 11-15-2021 She is also had lower extremity arterial duplex studies which did show that she had falsely elevated ABI on the right with a TBI of 0.45. On the left  she had an ABI of 1.05 with a TBI of 0.12. She is pretty much monophasic throughout. With that being said there was definite reason for concern with regard to her blood flow into the legs she does have a follow-up with the vascular surgeon coming up shortly as well. 12-14-2021 upon evaluation today patient appears to be doing decently well currently in regard to her wound. We are definitely seeing some signs of improvement there is definitely some necrotic tissue as well but try to clean some this away carefully today. Fortunately I think that she is going to tolerate the debridement without complication she otherwise seems to be healing excellent. I think get a lot of the dead tissue clearway will make a big difference as far as her healing is concerned. 12-21-2021 upon evaluation today patient appears to be doing well currently in regard to her legs she has been using the Hibiclens which she tells me seems to be helping. She tells me the leg feels much better washing with this compared to what it was previous which is great news. Fortunately I do not see any signs of active infection and I am overall extremely pleased with where we stand. I do think we are on the right track here. 01-04-2022 upon evaluation today patient appears to be doing unfortunately somewhat worse in regard to the new pustule areas that are appearing on her lower extremity. Again I have discussed with her based on what I am seeing that though she does have a wound I do not feel like these new pustule areas are related to the wound in particular. Again I am unsure of exactly what is going on and I really think she would benefit from seeing a dermatology specialist. She voiced understanding and  is not opposed to this she did see a dermatologist in [place] but they were under the impression they were just to do a biopsy which she had already had and therefore really did not do much she tells me that so I really know about that  encounter. With that being said in general I do feel like that the patient is doing better although that is more in relation to the wound I think these new pustule areas still have me concerned. I do think she could be a candidate for topical antibiotics such as [employer] but at the same time I do think that we need to continue to monitor for any signs of overall worsening infection in the interim. 01-11-2022 upon evaluation today patient appears to be doing well currently in regard to her wounds nothing seems to be doing worse although she continues to have issues with multiple pustules forming I am really not certain that this is actually infection. I am beginning to suspect something rheumatologic/autoimmune. With that being said she did see dermatology today and it was felt by the dermatologist that this is likely infectious despite the fact we had a negative culture from when I saw her last week. They did repeat a culture on one of the pustules. 01-25-2022 upon evaluation today patient appears to be doing better currently in regard to her wounds in general. Everything is showing signs of improvement which is great news. Fortunately I see no evidence of active infection locally or systemically at this time which is great news. She does have a new skin tear on her forearm but this she did try to pull back over and has done quite well. 02-08-2022 upon evaluation today patient appears to be doing excellent currently in regard to her wound. She has been tolerating the dressing changes without complication and again she still seems to be making progress. Despite this she still has a lot of blisters that keep popping up again I have cultured them and they have been cultured twice other once at dermatology once I believe with primary care never have we identified any causative organisms. I am beginning to believe in fact have made a referral to Dr. Posey Pronto, rheumatology, in order to see if there is some other  type of inflammatory process they were dealing with here. I think that this is becoming increasingly likely. 02-21-2022 upon evaluation today patient appears to be doing well currently in regard to her wounds. In fact everything is showing signs of improvement which is good news she has found some compression that she was interested in that seems to have been doing extremely well at this point. I feel like that may be part of what is improving the here overall. 03-07-2022 upon evaluation today patient actually is making some good progress here in regard to her right leg I am very pleased in this regard. With that being said she does have a wound on her left leg which does appear to be a venous leg ulcer that occurred as a result of a blister that arose and then burst over the past week. Fortunately there does not appear to be any evidence of active infection at this time which is great news. No fevers, chills, nausea, vomiting, or diarrhea. [name], [name] ([id number]) Objective Constitutional Well-nourished and well-hydrated in no acute distress. Vitals Time Taken: 10:32 AM, Height: 63 in, Weight: 147 lbs, BMI: 26, Temperature: 98.3 F, Pulse: 118 bpm, Respiratory Rate: 16 breaths/min, Blood Pressure: 113/77 mmHg. Respiratory normal  breathing without difficulty. Psychiatric this patient is able to make decisions and demonstrates good insight into disease process. Alert and Oriented x 3. pleasant and cooperative. General Notes: Upon inspection patient's wound bed actually showed signs of good granulation and epithelization at this point. I did perform debridement on the right leg to clearway some of the necrotic debris she tolerated that today without complication. Postdebridement patient's wound bed actually seems to be doing much better which is great news. Integumentary (Hair, Skin) Wound #2 status is Open. Original cause of wound was Gradually Appeared. The date acquired was: 07/08/2021. The  wound has been in treatment 13 weeks. The wound is located on the Right,Lateral Lower Leg. The wound measures 5cm length x 0.9cm width x 0.2cm depth; 3.534cm^2 area and 0.707cm^3 volume. There is Fat Layer (Subcutaneous Tissue) exposed. There is a medium amount of serosanguineous drainage noted. There is large (67-100%) red granulation within the wound bed. There is a small (1-33%) amount of necrotic tissue within the wound bed. Wound #6 status is Open. Original cause of wound was Shear/Friction. The date acquired was: 03/01/2022. The wound is located on the Left,Anterior Lower Leg. The wound measures 1.8cm length x 1.8cm width x 0.1cm depth; 2.545cm^2 area and 0.254cm^3 volume. There is Fat Layer (Subcutaneous Tissue) exposed. There is no tunneling or undermining noted. There is a medium amount of serosanguineous drainage noted. The wound margin is flat and intact. There is no granulation within the wound bed. There is a small (1-33%) amount of necrotic tissue within the wound bed including Adherent Slough. Assessment Active Problems ICD-10 Chronic venous hypertension (idiopathic) with ulcer and inflammation of bilateral lower extremity Non-pressure chronic ulcer of other part of right lower leg with fat layer exposed Cellulitis of right lower limb Laceration without foreign body of left forearm, initial encounter Non-pressure chronic ulcer of other part of left lower leg with fat layer exposed Other specified peripheral vascular diseases Acquired absence of other right toe(s) Procedures Wound #2 Pre-procedure diagnosis of Wound #2 is a Venous Leg Ulcer located on the Right,Lateral Lower Leg .Severity of Tissue Pre Debridement is: Fat layer exposed. There was a Excisional Skin/Subcutaneous Tissue Debridement with a total area of 4.5 sq cm performed by Tommie Sams., PA-C. With the following instrument(s): Curette to remove Viable and Non-Viable tissue/material. Material removed includes  Subcutaneous Tissue, Slough, and Other: calcium deposit. A time out was conducted at 10:56, prior to the start of the procedure. A Minimum amount of bleeding was controlled with Pressure. The procedure was tolerated well. Post Debridement Measurements: 5cm length x 0.9cm width x 0.3cm depth; 1.06cm^3 volume. Character of Wound/Ulcer Post Debridement is stable. Severity of Tissue Post Debridement is: Fat layer exposed. Post procedure Diagnosis Wound #2: Same as Pre-Procedure Plan [name], [name] ([id number]) Follow-up Appointments: Return Appointment in 1 week. Bathing/ Shower/ Hygiene: May shower; gently cleanse wound with antibacterial soap, rinse and pat dry prior to dressing wounds - Hibiclens recommended for washing right lower leg prior to dressing changes. You may pick this up at a pharmacy or purchase on [place] No tub bath. Edema Control - Lymphedema / Segmental Compressive Device / Other: Tubigrip double layer applied - Tubi D double layer Elevate, Exercise Daily and Avoid Standing for Long Periods of Time. Elevate legs to the level of the heart and pump ankles as often as possible Elevate leg(s) parallel to the floor when sitting. WOUND #2: - Lower Leg Wound Laterality: Right, Lateral Cleanser: Hibiclens, 16 (oz) 3 x Per  Week/30 Days Discharge Instructions: Put Hibiclens on your skin and rub it in gently for five minutes with a washcloth. Turn the water back on and rinse very well with warm water. Do not use your regular soap after using and rinsing Hibiclens. Pat yourself dry with a clean towel. Primary Dressing: Silvercel 4 1/4x 4 1/4 (in/in) 3 x Per Week/30 Days Discharge Instructions: Apply Silvercel 4 1/4x 4 1/4 (in/in) as instructed Secondary Dressing: ABD Pad 5x9 (in/in) 3 x Per Week/30 Days Discharge Instructions: Cover with ABD pad Secured With: Medipore Tape - 55M Medipore H Soft Cloth Surgical Tape, 2x2 (in/yd) 3 x Per Week/30 Days Secured With: Conform 4'' -  Conforming Stretch Gauze Bandage 4x75 (in/in) 3 x Per Week/30 Days Discharge Instructions: Apply as directed Secured With: Tubigrip Size D, 3x10 (in/yd) 3 x Per Week/30 Days WOUND #6: - Lower Leg Wound Laterality: Left, Anterior Cleanser: Hibiclens, 16 (oz) 3 x Per Week/30 Days Discharge Instructions: Put Hibiclens on your skin and rub it in gently for five minutes with a washcloth. Turn the water back on and rinse very well with warm water. Do not use your regular soap after using and rinsing Hibiclens. Pat yourself dry with a clean towel. Primary Dressing: Silvercel 4 1/4x 4 1/4 (in/in) 3 x Per Week/30 Days Discharge Instructions: Apply Silvercel 4 1/4x 4 1/4 (in/in) as instructed Secondary Dressing: ABD Pad 5x9 (in/in) 3 x Per Week/30 Days Discharge Instructions: Cover with ABD pad Secured With: Medipore Tape - 55M Medipore H Soft Cloth Surgical Tape, 2x2 (in/yd) 3 x Per Week/30 Days Secured With: Conform 4'' - Conforming Stretch Gauze Bandage 4x75 (in/in) 3 x Per Week/30 Days Discharge Instructions: Apply as directed Secured With: Tubigrip Size D, 3x10 (in/yd) 3 x Per Week/30 Days 1. Based on what I am seeing I am going to suggest that we have the patient go ahead and continue to monitor for any signs of worsening or infection. Fortunately I see no evidence of active infection at this time systemically nor locally which is great news. 2. I am also can recommend that we continue with the silver cell which I think is doing much better. Overall I think that it is helping her to keep this clean and the drainage under control which has been helpful. 3. She is continuing to utilize the Hibiclens for cleansing. We will see patient back for reevaluation in 2 weeks here in the clinic. If anything worsens or changes patient will contact our office for additional recommendations. Electronic Signature(s) Signed: 03/07/2022 11:03:06 AM By: Worthy Keeler PA-C Entered By: Worthy Keeler on  03/07/2022 11:03:06 [name], [name] ([id number]) -------------------------------------------------------------------------------- SuperBill Details Patient Name: [name]. Date of Service: 03/07/2022 Medical Record Number: [id number] Patient Account Number: [id number] Date of Birth/Sex: [birth year] (71 y.o. F) Treating RN: Cornell Barman Primary Care Provider: [name] Other Clinician: Massie Kluver Referring Provider: [name] Treating Provider/Extender: Skipper Cliche in Treatment: 13 Diagnosis Coding ICD-10 Codes Code Description [phone] Chronic venous hypertension (idiopathic) with ulcer and inflammation of bilateral lower extremity L97.812 Non-pressure chronic ulcer of other part of right lower leg with fat layer exposed L03.115 Cellulitis of right lower limb S51.812A Laceration without foreign body of left forearm, initial encounter L97.822 Non-pressure chronic ulcer of other part of left lower leg with fat layer exposed I73.89 Other specified peripheral vascular diseases Z89.421 Acquired absence of other right toe(s) Facility Procedures CPT4 Code: [id number] Description: 11042 - DEB SUBQ TISSUE 20 SQ CM/< Modifier: Quantity: 1  CPT4 Code: Description: ICD-10 Diagnosis Description L97.812 Non-pressure chronic ulcer of other part of right lower leg with fat lay Modifier: er exposed Quantity: Physician Procedures CPT4 Code: 1146431 Description: [place] - WC PHYS SUBQ TISS 20 SQ CM Modifier: Quantity: 1 CPT4 Code: Description: ICD-10 Diagnosis Description U27.670 Non-pressure chronic ulcer of other part of right lower leg with fat lay Modifier: er exposed Quantity: Electronic Signature(s) Signed: 03/07/2022 11:12:51 AM By: Worthy Keeler PA-C Entered By: Worthy Keeler on 03/07/2022 11:12:51

## 2022-03-08 NOTE — Progress Notes (Signed)
MAURA, BRAATEN ([id number]) Visit Report for 03/07/2022 Arrival Information Details Patient Name: [name], [name]. Date of Service: 03/07/2022 10:30 AM Medical Record Number: [id number] Patient Account Number: [id number] Date of Birth/Sex: 04-07-1951 (71 y.o. F) Treating RN: Cornell Barman Primary Care Aundre Hietala: [name] Other Clinician: Massie Kluver Referring Brynlei Klausner: [name] Treating Rishab Stoudt/Extender: Skipper Cliche in Treatment: 13 Visit Information History Since Last Visit All ordered tests and consults were completed: No Patient Arrived: Ambulatory Added or deleted any medications: No Arrival Time: 10:31 Any new allergies or adverse reactions: No Transfer Assistance: None Signs or symptoms of abuse/neglect since last visito No Patient Requires Transmission-Based No Precautions: Pain Present Now: No Patient Has Alerts: Yes Patient Alerts: Patient on Blood Thinner Electronic Signature(s) Signed: 03/07/2022 5:04:14 PM By: Massie Kluver Entered By: Massie Kluver on 03/07/2022 10:32:16 Nadel, [name] ([id number]) -------------------------------------------------------------------------------- Clinic Level of Care Assessment Details Patient Name: [name]. Date of Service: 03/07/2022 10:30 AM Medical Record Number: [id number] Patient Account Number: [id number] Date of Birth/Sex: 13-Jan-1951 (71 y.o. F) Treating RN: Cornell Barman Primary Care Vondell Sowell: [name] Other Clinician: Massie Kluver Referring Sandee Bernath: [name] Treating Liberti Appleton/Extender: Skipper Cliche in Treatment: 13 Clinic Level of Care Assessment Items TOOL 1 Quantity Score '[]'$  - Use when EandM and Procedure is performed on INITIAL visit 0 ASSESSMENTS - Nursing Assessment / Reassessment '[]'$  - General Physical Exam (combine w/ comprehensive assessment (listed just below) when performed on new 0 pt. evals) '[]'$  - 0 Comprehensive Assessment (HX, ROS, Risk Assessments, Wounds Hx,  etc.) ASSESSMENTS - Wound and Skin Assessment / Reassessment '[]'$  - Dermatologic / Skin Assessment (not related to wound area) 0 ASSESSMENTS - Ostomy and/or Continence Assessment and Care '[]'$  - Incontinence Assessment and Management 0 '[]'$  - 0 Ostomy Care Assessment and Management (repouching, etc.) PROCESS - Coordination of Care '[]'$  - Simple Patient / Family Education for ongoing care 0 '[]'$  - 0 Complex (extensive) Patient / Family Education for ongoing care '[]'$  - 0 Staff obtains Programmer, systems, Records, Test Results / Process Orders '[]'$  - 0 Staff telephones HHA, Nursing Homes / Clarify orders / etc '[]'$  - 0 Routine Transfer to another Facility (non-emergent condition) '[]'$  - 0 Routine Hospital Admission (non-emergent condition) '[]'$  - 0 New Admissions / Biomedical engineer / Ordering NPWT, Apligraf, etc. '[]'$  - 0 Emergency Hospital Admission (emergent condition) PROCESS - Special Needs '[]'$  - Pediatric / Minor Patient Management 0 '[]'$  - 0 Isolation Patient Management '[]'$  - 0 Hearing / Language / Visual special needs '[]'$  - 0 Assessment of Community assistance (transportation, D/C planning, etc.) '[]'$  - 0 Additional assistance / Altered mentation '[]'$  - 0 Support Surface(s) Assessment (bed, cushion, seat, etc.) INTERVENTIONS - Miscellaneous '[]'$  - External ear exam 0 '[]'$  - 0 Patient Transfer (multiple staff / Civil Service fast streamer / Similar devices) '[]'$  - 0 Simple Staple / Suture removal (25 or less) '[]'$  - 0 Complex Staple / Suture removal (26 or more) '[]'$  - 0 Hypo/Hyperglycemic Management (do not check if billed separately) '[]'$  - 0 Ankle / Brachial Index (ABI) - do not check if billed separately Has the patient been seen at the hospital within the last three years: Yes Total Score: 0 Level Of Care: ____ [name] ([id number]) Electronic Signature(s) Signed: 03/07/2022 5:04:14 PM By: Massie Kluver Entered By: Massie Kluver on 03/07/2022 11:01:14 Scalese, [name]  ([id number]) -------------------------------------------------------------------------------- Encounter Discharge Information Details Patient Name: [name]. Date of Service: 03/07/2022 10:30 AM Medical Record Number: [id number] Patient Account Number: [id number] Date  of Birth/Sex: 1951/02/09 (71 y.o. F) Treating RN: Cornell Barman Primary Care Misbah Hornaday: [name] Other Clinician: Massie Kluver Referring Pastor Sgro: [name] Treating Anhthu Perdew/Extender: Skipper Cliche in Treatment: 13 Encounter Discharge Information Items Post Procedure Vitals Discharge Condition: Stable Temperature (F): 98.3 Ambulatory Status: Ambulatory Pulse (bpm): 118 Discharge Destination: Home Respiratory Rate (breaths/min): 16 Transportation: Private Auto Blood Pressure (mmHg): 113/77 Accompanied By: self Schedule Follow-up Appointment: Yes Clinical Summary of Care: Electronic Signature(s) Signed: 03/07/2022 5:04:14 PM By: Massie Kluver Entered By: Massie Kluver on 03/07/2022 11:12:25 Brusseau, [name] ([id number]) -------------------------------------------------------------------------------- Lower Extremity Assessment Details Patient Name: [name]. Date of Service: 03/07/2022 10:30 AM Medical Record Number: [id number] Patient Account Number: [id number] Date of Birth/Sex: January 12, 1951 (71 y.o. F) Treating RN: Cornell Barman Primary Care Shanessa Hodak: [name] Other Clinician: Massie Kluver Referring Geoff Dacanay: [name] Treating Sahvanna Mcmanigal/Extender: Skipper Cliche in Treatment: 13 Edema Assessment Assessed: [Left: Yes] [Right: Yes] Edema: [Left: Yes] [Right: Yes] Calf Left: Right: Point of Measurement: 32 cm From Medial Instep 29.2 cm 32.5 cm Ankle Left: Right: Point of Measurement: 10 cm From Medial Instep 24 cm 20.3 cm Vascular Assessment Pulses: Dorsalis Pedis Palpable: [Left:Yes] [Right:Yes] Posterior Tibial Palpable: [Left:Yes] [Right:Yes] Electronic Signature(s) Signed:  03/07/2022 1:10:05 PM By: Gretta Cool, BSN, RN, CWS, [name] RN, BSN Signed: 03/07/2022 5:04:14 PM By: Massie Kluver Entered By: Massie Kluver on 03/07/2022 10:49:01 Porcher, [name] ([id number]) -------------------------------------------------------------------------------- Multi Wound Chart Details Patient Name: [name]. Date of Service: 03/07/2022 10:30 AM Medical Record Number: [id number] Patient Account Number: [id number] Date of Birth/Sex: [birth year] (71 y.o. F) Treating RN: Cornell Barman Primary Care Tiana Sivertson: [name] Other Clinician: Massie Kluver Referring Divante Kotch: [name] Treating Kemoni Quesenberry/Extender: Skipper Cliche in Treatment: 13 Vital Signs Height(in): 63 Pulse(bpm): 118 Weight(lbs): 147 Blood Pressure(mmHg): 113/77 Body Mass Index(BMI): 26 Temperature(F): 98.3 Respiratory Rate(breaths/min): 16 Photos: [N/A:N/A] Wound Location: Right, Lateral Lower Leg Left, Anterior Lower Leg N/A Wounding Event: Gradually Appeared Shear/Friction N/A Primary Etiology: Venous Leg Ulcer Trauma, Other N/A Comorbid History: Hypertension, Peripheral Arterial Hypertension, Peripheral Arterial N/A Disease Disease Date Acquired: 07/08/2021 03/01/2022 N/A Weeks of Treatment: 13 0 N/A Wound Status: Open Open N/A Wound Recurrence: No No N/A Measurements L x W x D (cm) 5x0.9x0.2 1.8x1.8x0.1 N/A Area (cm) : 3.534 2.545 N/A Volume (cm) : 0.707 0.254 N/A % Reduction in Area: 59.80% N/A N/A % Reduction in Volume: 79.90% N/A N/A Classification: Full Thickness Without Exposed Partial Thickness N/A Support Structures Exudate Amount: Medium Medium N/A Exudate Type: Serosanguineous Serosanguineous N/A Exudate Color: red, brown red, brown N/A Wound Margin: N/A Flat and Intact N/A Granulation Amount: Large (67-100%) None Present (0%) N/A Granulation Quality: Red N/A N/A Necrotic Amount: Small (1-33%) Small (1-33%) N/A Exposed Structures: Fat Layer (Subcutaneous Tissue): Fat Layer  (Subcutaneous Tissue): N/A Yes Yes Fascia: No Fascia: No Tendon: No Tendon: No Muscle: No Muscle: No Joint: No Joint: No Bone: No Bone: No Epithelialization: None None N/A Treatment Notes Electronic Signature(s) Signed: 03/07/2022 5:04:14 PM By: Massie Kluver Entered By: Massie Kluver on 03/07/2022 10:49:32 Dery, [name] ([id number]) -------------------------------------------------------------------------------- [place] Details Patient Name: [name]. Date of Service: 03/07/2022 10:30 AM Medical Record Number: [id number] Patient Account Number: [id number] Date of Birth/Sex: 1951/02/05 (71 y.o. F) Treating RN: Cornell Barman Primary Care Dany Harten: [name] Other Clinician: Massie Kluver Referring Gean Laursen: [name] Treating Americo Vallery/Extender: Skipper Cliche in Treatment: 13 Active Inactive Wound/Skin Impairment Nursing Diagnoses: Knowledge deficit related to ulceration/compromised skin integrity Goals: Patient/caregiver will verbalize understanding of skin  care regimen Date Initiated: 11/30/2021 Target Resolution Date: 12/30/2021 Goal Status: Active Ulcer/skin breakdown will have a volume reduction of 30% by week 4 Date Initiated: 11/30/2021 Target Resolution Date: 01/30/2022 Goal Status: Active Ulcer/skin breakdown will have a volume reduction of 50% by week 8 Date Initiated: 11/30/2021 Target Resolution Date: 03/02/2022 Goal Status: Active Ulcer/skin breakdown will have a volume reduction of 80% by week 12 Date Initiated: 11/30/2021 Target Resolution Date: 04/01/2022 Goal Status: Active Ulcer/skin breakdown will heal within 14 weeks Date Initiated: 11/30/2021 Target Resolution Date: 05/02/2022 Goal Status: Active Interventions: Assess patient/caregiver ability to obtain necessary supplies Assess patient/caregiver ability to perform ulcer/skin care regimen upon admission and as needed Assess ulceration(s) every  visit Notes: Electronic Signature(s) Signed: 03/07/2022 1:10:05 PM By: Gretta Cool, BSN, RN, CWS, [name] RN, BSN Signed: 03/07/2022 5:04:14 PM By: Massie Kluver Entered By: Massie Kluver on 03/07/2022 10:49:04 [place], [name] ([id number]) -------------------------------------------------------------------------------- Pain Assessment Details Patient Name: [name]. Date of Service: 03/07/2022 10:30 AM Medical Record Number: [id number] Patient Account Number: [id number] Date of Birth/Sex: 1950-11-30 (71 y.o. F) Treating RN: Cornell Barman Primary Care Chane Cowden: [name] Other Clinician: Massie Kluver Referring Wafa Martes: [name] Treating Kemyah Buser/Extender: Skipper Cliche in Treatment: 13 Active Problems Location of Pain Severity and Description of Pain Patient Has Paino No Site Locations Pain Management and Medication Current Pain Management: Electronic Signature(s) Signed: 03/07/2022 1:10:05 PM By: Gretta Cool, BSN, RN, CWS, [name] RN, BSN Signed: 03/07/2022 5:04:14 PM By: Massie Kluver Entered By: Massie Kluver on 03/07/2022 10:34:57 [place], [name] ([id number]) -------------------------------------------------------------------------------- Patient/Caregiver Education Details Patient Name: [name] Date of Service: 03/07/2022 10:30 AM Medical Record Number: [id number] Patient Account Number: [id number] Date of Birth/Gender: 1950-12-10 (71 y.o. F) Treating RN: Cornell Barman Primary Care Physician: [name] Other Clinician: Massie Kluver Referring Physician: [name] Treating Physician/Extender: Skipper Cliche in Treatment: 13 Education Assessment Education Provided To: Patient Education Topics Provided Wound/Skin Impairment: Handouts: Other: continue wound care as directed Methods: Explain/Verbal Responses: State content correctly Electronic Signature(s) Signed: 03/07/2022 5:04:14 PM By: Massie Kluver Entered By: Massie Kluver on 03/07/2022  11:11:47 Casas, [name] ([id number]) -------------------------------------------------------------------------------- Wound Assessment Details Patient Name: [name]. Date of Service: 03/07/2022 10:30 AM Medical Record Number: [id number] Patient Account Number: [id number] Date of Birth/Sex: 1950-10-17 (71 y.o. F) Treating RN: Cornell Barman Primary Care Tyja Gortney: [name] Other Clinician: Massie Kluver Referring Blakely Maranan: [name] Treating Jerremy Maione/Extender: Skipper Cliche in Treatment: 13 Wound Status Wound Number: 2 Primary Etiology: Venous Leg Ulcer Wound Location: Right, Lateral Lower Leg Wound Status: Open Wounding Event: Gradually Appeared Comorbid History: Hypertension, Peripheral Arterial Disease Date Acquired: 07/08/2021 Weeks Of Treatment: 13 Clustered Wound: No Photos Wound Measurements Length: (cm) 5 Width: (cm) 0.9 Depth: (cm) 0.2 Area: (cm) 3.534 Volume: (cm) 0.707 % Reduction in Area: 59.8% % Reduction in Volume: 79.9% Epithelialization: None Wound Description Classification: Full Thickness Without Exposed Support Structures Exudate Amount: Medium Exudate Type: Serosanguineous Exudate Color: red, brown Foul Odor After Cleansing: No Slough/Fibrino Yes Wound Bed Granulation Amount: Large (67-100%) Exposed Structure Granulation Quality: Red Fascia Exposed: No Necrotic Amount: Small (1-33%) Fat Layer (Subcutaneous Tissue) Exposed: Yes Tendon Exposed: No Muscle Exposed: No Joint Exposed: No Bone Exposed: No Treatment Notes Wound #2 (Lower Leg) Wound Laterality: Right, Lateral Cleanser Hibiclens, 16 (oz) Discharge Instruction: Put Hibiclenso on your skin and rub it in gently for five minutes with a washcloth. Turn the water back on and rinse very well with warm water. Do not use your regular soap  after using and rinsing Hibiclenso. Pat yourself dry with a clean towel. Peri-Wound Care SHELTON, SOLER ([id number]) Topical Primary  Dressing Silvercel 4 1/4x 4 1/4 (in/in) Discharge Instruction: Apply Silvercel 4 1/4x 4 1/4 (in/in) as instructed Secondary Dressing ABD Pad 5x9 (in/in) Discharge Instruction: Cover with ABD pad Secured With [place] Surgical Tape, 2x2 (in/yd) Conform 4'' - Conforming Stretch Gauze Bandage 4x75 (in/in) Discharge Instruction: Apply as directed Tubigrip Size D, 3x10 (in/yd) Compression Wrap Compression Stockings Add-Ons Electronic Signature(s) Signed: 03/07/2022 1:10:05 PM By: Gretta Cool, BSN, RN, CWS, [name] RN, BSN Signed: 03/07/2022 5:04:14 PM By: Massie Kluver Entered By: Massie Kluver on 03/07/2022 10:45:28 [place], [name] ([id number]) -------------------------------------------------------------------------------- Wound Assessment Details Patient Name: [name]. Date of Service: 03/07/2022 10:30 AM Medical Record Number: [id number] Patient Account Number: [id number] Date of Birth/Sex: 31-Aug-1950 (71 y.o. F) Treating RN: Cornell Barman Primary Care Zerline Melchior: [name] Other Clinician: Massie Kluver Referring Cam Harnden: [name] Treating Tammara Massing/Extender: Skipper Cliche in Treatment: 13 Wound Status Wound Number: 6 Primary Etiology: Trauma, Other Wound Location: Left, Anterior Lower Leg Wound Status: Open Wounding Event: Shear/Friction Comorbid History: Hypertension, Peripheral Arterial Disease Date Acquired: 03/01/2022 Weeks Of Treatment: 0 Clustered Wound: No Photos Wound Measurements Length: (cm) 1.8 Width: (cm) 1.8 Depth: (cm) 0.1 Area: (cm) 2.545 Volume: (cm) 0.254 % Reduction in Area: % Reduction in Volume: Epithelialization: None Tunneling: No Undermining: No Wound Description Classification: Partial Thickness Wound Margin: Flat and Intact Exudate Amount: Medium Exudate Type: Serosanguineous Exudate Color: red, brown Foul Odor After Cleansing: No Slough/Fibrino Yes Wound Bed Granulation Amount: None Present  (0%) Exposed Structure Necrotic Amount: Small (1-33%) Fascia Exposed: No Necrotic Quality: Adherent Slough Fat Layer (Subcutaneous Tissue) Exposed: Yes Tendon Exposed: No Muscle Exposed: No Joint Exposed: No Bone Exposed: No Treatment Notes Wound #6 (Lower Leg) Wound Laterality: Left, Anterior Cleanser Hibiclens, 16 (oz) Discharge Instruction: Put Hibiclenso on your skin and rub it in gently for five minutes with a washcloth. Turn the water back on and rinse very well with warm water. Do not use your regular soap after using and rinsing Hibiclenso. Pat yourself dry with a clean towel. RAQUEL, RACEY ([id number]) Peri-Wound Care Topical Primary Dressing Silvercel 4 1/4x 4 1/4 (in/in) Discharge Instruction: Apply Silvercel 4 1/4x 4 1/4 (in/in) as instructed Secondary Dressing ABD Pad 5x9 (in/in) Discharge Instruction: Cover with ABD pad Secured With [place] Soft Cloth Surgical Tape, 2x2 (in/yd) Conform 4'' - Conforming Stretch Gauze Bandage 4x75 (in/in) Discharge Instruction: Apply as directed Tubigrip Size D, 3x10 (in/yd) Compression Wrap Compression Stockings Add-Ons Electronic Signature(s) Signed: 03/07/2022 1:10:05 PM By: Gretta Cool, BSN, RN, CWS, [name] RN, BSN Signed: 03/07/2022 5:04:14 PM By: Massie Kluver Entered By: Massie Kluver on 03/07/2022 10:47:42 [place], [name] ([id number]) -------------------------------------------------------------------------------- [place] Details Patient Name: [name]. Date of Service: 03/07/2022 10:30 AM Medical Record Number: [id number] Patient Account Number: [id number] Date of Birth/Sex: March 26, 1951 (71 y.o. F) Treating RN: Cornell Barman Primary Care Jarome Trull: [name] Other Clinician: Massie Kluver Referring Seiji Wiswell: [name] Treating Wisdom Seybold/Extender: Skipper Cliche in Treatment: 13 Vital Signs Time Taken: 10:32 Temperature (F): 98.3 Height (in): 63 Pulse (bpm): 118 Weight (lbs):  147 Respiratory Rate (breaths/min): 16 Body Mass Index (BMI): 26 Blood Pressure (mmHg): 113/77 Reference Range: 80 - 120 mg / dl Electronic Signature(s) Signed: 03/07/2022 5:04:14 PM By: Massie Kluver Entered By: Massie Kluver on 03/07/2022 10:34:53

## 2022-03-21 ENCOUNTER — Encounter: Payer: Medicare HMO | Admitting: Physician Assistant

## 2022-03-21 DIAGNOSIS — I87333 Chronic venous hypertension (idiopathic) with ulcer and inflammation of bilateral lower extremity: Secondary | ICD-10-CM | POA: Diagnosis not present

## 2022-03-21 NOTE — Progress Notes (Signed)
[name], NATION ([id number]) [id number] Page 1 of 9 Visit Report for 03/21/2022 Chief Complaint Document Details Patient Name: Date of Service: MALA, [name]. 03/21/2022 12:45 PM Medical Record Number: [id number] Patient Account Number: [id number] Date of Birth/Sex: Treating RN: [birth year] (71 y.o. [name] Primary Care Provider: [name] Other Clinician: Massie Kluver Referring Provider: Treating Provider/Extender: [name] Weeks in Treatment: 15 Information Obtained from: Patient Chief Complaint Bilateral LE Ulcers Electronic Signature(s) Signed: 03/21/2022 10:04:23 AM By: Worthy Keeler PA-C Entered By: Worthy Keeler on 03/21/2022 13:04:23 -------------------------------------------------------------------------------- Debridement Details Patient Name: Date of Service: Graylin Shiver [name] 03/21/2022 12:45 PM Medical Record Number: [id number] Patient Account Number: [id number] Date of Birth/Sex: Treating RN: 30-Sep-1950 (71 y.o. [name] Primary Care Provider: [name] Other Clinician: Massie Kluver Referring Provider: Treating Provider/Extender: [name] Weeks in Treatment: 15 Debridement Performed for Assessment: Wound #2 Right,Lateral Lower Leg Performed By: Physician Tommie Sams., PA-C Debridement Type: Debridement Severity of Tissue Pre Debridement: Fat layer exposed Level of Consciousness (Pre-procedure): Awake and Alert Pre-procedure Verification/Time Out Yes - 13:14 Taken: Start Time: 13:14 T Area Debrided (L x W): otal 2.5 (cm) x 0.5 (cm) = 1.25 (cm) Tissue and other material debrided: Viable, Non-Viable, Slough, Subcutaneous, Slough Level: Skin/Subcutaneous Tissue Debridement Description: Excisional Instrument: Curette Bleeding: Minimum Hemostasis Achieved: Pressure End Time: 13:17 Response to Treatment: Procedure was tolerated well Level of Consciousness (Post- Awake and  Alert procedure): KENNLEY, SCHWANDT ([id number]) [id number] Page 2 of 9 Post Debridement Measurements of Total Wound Length: (cm) 5.3 Width: (cm) 1.2 Depth: (cm) 0.4 Volume: (cm) 1.998 Character of Wound/Ulcer Post Debridement: Stable Severity of Tissue Post Debridement: Fat layer exposed Post Procedure Diagnosis Same as Pre-procedure Electronic Signature(s) Signed: 03/21/2022 12:28:09 PM By: Gretta Cool, BSN, RN, CWS, [name] RN, BSN Signed: 03/21/2022 2:02:33 PM By: Worthy Keeler PA-C Signed: 03/24/2022 11:23:10 AM By: Massie Kluver Entered By: Massie Kluver on 03/21/2022 13:17:26 -------------------------------------------------------------------------------- HPI Details Patient Name: Date of Service: Graylin Shiver [name] 03/21/2022 12:45 PM Medical Record Number: [id number] Patient Account Number: [id number] Date of Birth/Sex: Treating RN: 1950-07-29 (71 y.o. [name] Primary Care Provider: [name] Other Clinician: Massie Kluver Referring Provider: Treating Provider/Extender: [name] Weeks in Treatment: 15 History of Present Illness HPI Description: 11-30-2021 upon evaluation today patient presents for initial inspection here in our clinic concerning wounds that she has over the right lateral leg as well as the right medial leg. Subsequently the patient is dealing with lower extremity edema which I do believe has been one of the big causes of what is going on here. She also does have what appears to be cellulitis of the leg as well as peripheral vascular disease. She already has amputation of a toe on the right foot. With that being said I did review that surgical pathology from the amputation this was the right third toe which showed a gangrenous ulcer with underlying cellulitis negative for acute osteomyelitis but acute cellulitis was present in the proximal margin. There was also noted on this pathology report a biopsy from the  right leg which showed a gangrenous ulcer with underlying cellulitis and foreign body type giant cell reaction. That was performed on 11-11-2021 and resulted on 11-15-2021 She is also had lower extremity arterial duplex studies which did show that she had falsely elevated ABI on the right with a TBI of 0.45. On the left she had an ABI of 1.05 with a TBI  of 0.12. She is pretty much monophasic throughout. With that being said there was definite reason for concern with regard to her blood flow into the legs she does have a follow-up with the vascular surgeon coming up shortly as well. 12-14-2021 upon evaluation today patient appears to be doing decently well currently in regard to her wound. We are definitely seeing some signs of improvement there is definitely some necrotic tissue as well but try to clean some this away carefully today. Fortunately I think that she is going to tolerate the debridement without complication she otherwise seems to be healing excellent. I think get a lot of the dead tissue clearway will make a big difference as far as her healing is concerned. 12-21-2021 upon evaluation today patient appears to be doing well currently in regard to her legs she has been using the Hibiclens which she tells me seems to be helping. She tells me the leg feels much better washing with this compared to what it was previous which is great news. Fortunately I do not see any signs of active infection and I am overall extremely pleased with where we stand. I do think we are on the right track here. 01-04-2022 upon evaluation today patient appears to be doing unfortunately somewhat worse in regard to the new pustule areas that are appearing on her lower extremity. Again I have discussed with her based on what I am seeing that though she does have a wound I do not feel like these new pustule areas are related to the wound in particular. Again I am unsure of exactly what is going on and I really think she would  benefit from seeing a dermatology specialist. She voiced understanding and is not opposed to this she did see a dermatologist in [place] but they were under the impression they were just to do a biopsy which she had already had and therefore really did not do much she tells me that so I really know about that encounter. With that being said in general I do feel like that the patient is doing better although that is more in relation to the wound I think these new pustule areas still have me concerned. I do think she could be a candidate for topical antibiotics such as [employer] but at the same time I do think that we need to continue to monitor for any signs of overall worsening infection in the interim. 01-11-2022 upon evaluation today patient appears to be doing well currently in regard to her wounds nothing seems to be doing worse although she continues to have issues with multiple pustules forming I am really not certain that this is actually infection. I am beginning to suspect something rheumatologic/autoimmune. With that being said she did see dermatology today and it was felt by the dermatologist that this is likely infectious despite the fact we had a negative culture from when I saw her last week. They did repeat a culture on one of the pustules. 01-25-2022 upon evaluation today patient appears to be doing better currently in regard to her wounds in general. Everything is showing signs of improvement BRYNLEIGH, SEQUEIRA ([id number]) [id number] Page 3 of 9 which is great news. Fortunately I see no evidence of active infection locally or systemically at this time which is great news. She does have a new skin tear on her forearm but this she did try to pull back over and has done quite well. 02-08-2022 upon evaluation today patient appears to be doing excellent  currently in regard to her wound. She has been tolerating the dressing changes without complication and again she  still seems to be making progress. Despite this she still has a lot of blisters that keep popping up again I have cultured them and they have been cultured twice other once at dermatology once I believe with primary care never have we identified any causative organisms. I am beginning to believe in fact have made a referral to Dr. Posey Pronto, rheumatology, in order to see if there is some other type of inflammatory process they were dealing with here. I think that this is becoming increasingly likely. 02-21-2022 upon evaluation today patient appears to be doing well currently in regard to her wounds. In fact everything is showing signs of improvement which is good news she has found some compression that she was interested in that seems to have been doing extremely well at this point. I feel like that may be part of what is improving the here overall. 03-07-2022 upon evaluation today patient actually is making some good progress here in regard to her right leg I am very pleased in this regard. With that being said she does have a wound on her left leg which does appear to be a venous leg ulcer that occurred as a result of a blister that arose and then burst over the past week. Fortunately there does not appear to be any evidence of active infection at this time which is great news. No fevers, chills, nausea, vomiting, or diarrhea. 03-21-2022 upon evaluation patient's wound is actually showing signs of significant improvement which is great news and overall I am extremely pleased with where we stand today. I do not see any evidence of infection locally or systemically which is great news. No fevers, chills, nausea, vomiting, or diarrhea. Electronic Signature(s) Signed: 03/21/2022 11:11:01 AM By: Worthy Keeler PA-C Entered By: Worthy Keeler on 03/21/2022 14:11:01 -------------------------------------------------------------------------------- Physical Exam Details Patient Name: Date of  Service: COLLEN, HOSTLER [name] 03/21/2022 12:45 PM Medical Record Number: [id number] Patient Account Number: [id number] Date of Birth/Sex: Treating RN: 06-15-1950 (71 y.o. [name] Primary Care Provider: [name] Other Clinician: Massie Kluver Referring Provider: Treating Provider/Extender: [name] Weeks in Treatment: 62 Constitutional Well-nourished and well-hydrated in no acute distress. Respiratory normal breathing without difficulty. Psychiatric this patient is able to make decisions and demonstrates good insight into disease process. Alert and Oriented x 3. pleasant and cooperative. Notes Upon inspection patient's wound again did require some sharp debridement clearway some of the necrotic debris she tolerated this debridement today without complication and postdebridement wound bed appears to be doing significantly better which is great news and very pleased in that regard. Overall I think we are making progress here though she still has a more expansive issue of her lower extremities which again does not appear to be infectious in nature she has had multiple negative cultures. Nonetheless I feel like this may be something more inflammatory she does see a rheumatologist in the upcoming week. Electronic Signature(s) Signed: 03/21/2022 2:11:24 PM By: Worthy Keeler PA-C Entered By: Worthy Keeler on 03/21/2022 14:11:23 Peek, [name] ([id number]) [id number] Page 4 of 9 -------------------------------------------------------------------------------- Physician Orders Details Patient Name: Date of Service: NAVEYA, ELLERMAN 03/21/2022 12:45 PM Medical Record Number: [id number] Patient Account Number: [id number] Date of Birth/Sex: Treating RN: 15-Dec-1950 (71 y.o. [name] Primary Care Provider: [name] Other Clinician: Massie Kluver Referring Provider: Treating Provider/Extender: [name]  Weeks in  Treatment: 15 Verbal / Phone Orders: No Diagnosis Coding ICD-10 Coding Code Description I87.333 Chronic venous hypertension (idiopathic) with ulcer and inflammation of bilateral lower extremity L97.812 Non-pressure chronic ulcer of other part of right lower leg with fat layer exposed L03.115 Cellulitis of right lower limb S51.812A Laceration without foreign body of left forearm, initial encounter L97.822 Non-pressure chronic ulcer of other part of left lower leg with fat layer exposed I73.89 Other specified peripheral vascular diseases Z89.421 Acquired absence of other right toe(s) Follow-up Appointments Return Appointment in 1 week. Bathing/ Shower/ Hygiene May shower; gently cleanse wound with antibacterial soap, rinse and pat dry prior to dressing wounds - Hibiclens recommended for washing right lower leg prior to dressing changes. You may pick this up at a pharmacy or purchase on [place] No tub bath. Edema Control - Lymphedema / Segmental Compressive Device / Other Tubigrip double layer applied - Tubi D double layer Elevate, Exercise Daily and A void Standing for Long Periods of Time. Elevate legs to the level of the heart and pump ankles as often as possible Elevate leg(s) parallel to the floor when sitting. Wound Treatment Wound #2 - Lower Leg Wound Laterality: Right, Lateral Cleanser: Hibiclens, 16 (oz) 3 x Per Week/30 Days Discharge Instructions: Put Hibiclens on your skin and rub it in gently for five minutes with a washcloth. Turn the water back on and rinse very well with warm water. Do not use your regular soap after using and rinsing Hibiclens. Pat yourself dry with a clean towel. Prim Dressing: Silvercel 4 1/4x 4 1/4 (in/in) 3 x Per Week/30 Days ary Discharge Instructions: Apply Silvercel 4 1/4x 4 1/4 (in/in) as instructed Secondary Dressing: ABD Pad 5x9 (in/in) 3 x Per Week/30 Days Discharge Instructions: Cover with ABD pad Secured With: Medipore T - 24M Medipore H Soft  Cloth Surgical T ape ape, 2x2 (in/yd) 3 x Per Week/30 Days Secured With: Conform 4'' - Conforming Stretch Gauze Bandage 4x75 (in/in) 3 x Per Week/30 Days Discharge Instructions: Apply as directed Secured With: Tubigrip Size D, 3x10 (in/yd) 3 x Per Week/30 Days Wound #6 - Lower Leg Wound Laterality: Left, Anterior Cleanser: Hibiclens, 16 (oz) 3 x Per Week/30 Days Discharge Instructions: Put Hibiclens on your skin and rub it in gently for five minutes with a washcloth. Turn the water back on and rinse very well with warm water. Do not use your regular soap after using and rinsing Hibiclens. Pat yourself dry with a clean towel. Prim Dressing: Silvercel 4 1/4x 4 1/4 (in/in) 3 x Per Week/30 Days ary Discharge Instructions: Apply Silvercel 4 1/4x 4 1/4 (in/in) as instructed SAPHIRA, LAHMANN ([id number]) [id number] Page 5 of 9 Secondary Dressing: ABD Pad 5x9 (in/in) 3 x Per Week/30 Days Discharge Instructions: Cover with ABD pad Secured With: Medipore T - 24M Medipore H Soft Cloth Surgical T ape ape, 2x2 (in/yd) 3 x Per Week/30 Days Secured With: Conform 4'' - Conforming Stretch Gauze Bandage 4x75 (in/in) 3 x Per Week/30 Days Discharge Instructions: Apply as directed Secured With: Tubigrip Size D, 3x10 (in/yd) 3 x Per Week/30 Days Electronic Signature(s) Signed: 03/21/2022 2:02:33 PM By: Worthy Keeler PA-C Signed: 03/24/2022 11:23:10 AM By: Massie Kluver Entered By: Massie Kluver on 03/21/2022 13:18:07 -------------------------------------------------------------------------------- Problem List Details Patient Name: Date of Service: Graylin Shiver [name] 03/21/2022 12:45 PM Medical Record Number: [id number] Patient Account Number: [id number] Date of Birth/Sex: Treating RN: Jul 02, 1950 (71 y.o. [name] Primary Care Provider: [name] Other Clinician: Massie Kluver  Referring Provider: Treating Provider/Extender: [name] Weeks in Treatment:  15 Active Problems ICD-10 Encounter Code Description Active Date MDM Diagnosis I87.333 Chronic venous hypertension (idiopathic) with ulcer and inflammation of 11/30/2021 No Yes bilateral lower extremity L97.812 Non-pressure chronic ulcer of other part of right lower leg with fat layer 11/30/2021 No Yes exposed L03.115 Cellulitis of right lower limb 11/30/2021 No Yes S51.812A Laceration without foreign body of left forearm, initial encounter 01/25/2022 No Yes L97.822 Non-pressure chronic ulcer of other part of left lower leg with fat layer exposed10/07/2021 No Yes I73.89 Other specified peripheral vascular diseases 11/30/2021 No Yes Z89.421 Acquired absence of other right toe(s) 11/30/2021 No Yes Strough, [name] ([id number]) [id number] Page 6 of 9 Inactive Problems Resolved Problems Electronic Signature(s) Signed: 03/21/2022 12:57:23 PM By: Worthy Keeler PA-C Entered By: Worthy Keeler on 03/21/2022 12:57:22 -------------------------------------------------------------------------------- Progress Note Details Patient Name: Date of Service: Graylin Shiver [name] 03/21/2022 12:45 PM Medical Record Number: [id number] Patient Account Number: [id number] Date of Birth/Sex: Treating RN: [birth year] (71 y.o. [name] Primary Care Provider: [name] Other Clinician: Massie Kluver Referring Provider: Treating Provider/Extender: [name] Weeks in Treatment: 15 Subjective Chief Complaint Information obtained from Patient Bilateral LE Ulcers History of Present Illness (HPI) 11-30-2021 upon evaluation today patient presents for initial inspection here in our clinic concerning wounds that she has over the right lateral leg as well as the right medial leg. Subsequently the patient is dealing with lower extremity edema which I do believe has been one of the big causes of what is going on here. She also does have what appears to be cellulitis of the leg  as well as peripheral vascular disease. She already has amputation of a toe on the right foot. With that being said I did review that surgical pathology from the amputation this was the right third toe which showed a gangrenous ulcer with underlying cellulitis negative for acute osteomyelitis but acute cellulitis was present in the proximal margin. There was also noted on this pathology report a biopsy from the right leg which showed a gangrenous ulcer with underlying cellulitis and foreign body type giant cell reaction. That was performed on 11-11-2021 and resulted on 11-15-2021 She is also had lower extremity arterial duplex studies which did show that she had falsely elevated ABI on the right with a TBI of 0.45. On the left she had an ABI of 1.05 with a TBI of 0.12. She is pretty much monophasic throughout. With that being said there was definite reason for concern with regard to her blood flow into the legs she does have a follow-up with the vascular surgeon coming up shortly as well. 12-14-2021 upon evaluation today patient appears to be doing decently well currently in regard to her wound. We are definitely seeing some signs of improvement there is definitely some necrotic tissue as well but try to clean some this away carefully today. Fortunately I think that she is going to tolerate the debridement without complication she otherwise seems to be healing excellent. I think get a lot of the dead tissue clearway will make a big difference as far as her healing is concerned. 12-21-2021 upon evaluation today patient appears to be doing well currently in regard to her legs she has been using the Hibiclens which she tells me seems to be helping. She tells me the leg feels much better washing with this compared to what it was previous which is great news. Fortunately I do not  see any signs of active infection and I am overall extremely pleased with where we stand. I do think we are on the right track  here. 01-04-2022 upon evaluation today patient appears to be doing unfortunately somewhat worse in regard to the new pustule areas that are appearing on her lower extremity. Again I have discussed with her based on what I am seeing that though she does have a wound I do not feel like these new pustule areas are related to the wound in particular. Again I am unsure of exactly what is going on and I really think she would benefit from seeing a dermatology specialist. She voiced understanding and is not opposed to this she did see a dermatologist in [place] but they were under the impression they were just to do a biopsy which she had already had and therefore really did not do much she tells me that so I really know about that encounter. With that being said in general I do feel like that the patient is doing better although that is more in relation to the wound I think these new pustule areas still have me concerned. I do think she could be a candidate for topical antibiotics such as [employer] but at the same time I do think that we need to continue to monitor for any signs of overall worsening infection in the interim. 01-11-2022 upon evaluation today patient appears to be doing well currently in regard to her wounds nothing seems to be doing worse although she continues to have issues with multiple pustules forming I am really not certain that this is actually infection. I am beginning to suspect something rheumatologic/autoimmune. With that being said she did see dermatology today and it was felt by the dermatologist that this is likely infectious despite the fact we had a negative culture from when I saw her last week. They did repeat a culture on one of the pustules. 01-25-2022 upon evaluation today patient appears to be doing better currently in regard to her wounds in general. Everything is showing signs of improvement which is great news. Fortunately I see no evidence of active infection locally or  systemically at this time which is great news. She does have a new skin tear on her forearm but this she did try to pull back over and has done quite well. 02-08-2022 upon evaluation today patient appears to be doing excellent currently in regard to her wound. She has been tolerating the dressing changes without complication and again she still seems to be making progress. Despite this she still has a lot of blisters that keep popping up again I have cultured them and they have been cultured twice other once at dermatology once I believe with primary care never have we identified any causative organisms. I am beginning to believe in fact have made a referral to Dr. Posey Pronto, rheumatology, in order to see if there is some other type of inflammatory process they were dealing with DESHANTE, CASSELL ([id number]) [id number] Page 7 of 9 here. I think that this is becoming increasingly likely. 02-21-2022 upon evaluation today patient appears to be doing well currently in regard to her wounds. In fact everything is showing signs of improvement which is good news she has found some compression that she was interested in that seems to have been doing extremely well at this point. I feel like that may be part of what is improving the here overall. 03-07-2022 upon evaluation today patient actually is making some good  progress here in regard to her right leg I am very pleased in this regard. With that being said she does have a wound on her left leg which does appear to be a venous leg ulcer that occurred as a result of a blister that arose and then burst over the past week. Fortunately there does not appear to be any evidence of active infection at this time which is great news. No fevers, chills, nausea, vomiting, or diarrhea. 03-21-2022 upon evaluation patient's wound is actually showing signs of significant improvement which is great news and overall I am extremely pleased with where we  stand today. I do not see any evidence of infection locally or systemically which is great news. No fevers, chills, nausea, vomiting, or diarrhea. Objective Constitutional Well-nourished and well-hydrated in no acute distress. Vitals Time Taken: 1:06 PM, Height: 63 in, Weight: 147 lbs, BMI: 26, Temperature: 98.3 F, Pulse: 90 bpm, Respiratory Rate: 16 breaths/min, Blood Pressure: 119/78 mmHg. Respiratory normal breathing without difficulty. Psychiatric this patient is able to make decisions and demonstrates good insight into disease process. Alert and Oriented x 3. pleasant and cooperative. General Notes: Upon inspection patient's wound again did require some sharp debridement clearway some of the necrotic debris she tolerated this debridement today without complication and postdebridement wound bed appears to be doing significantly better which is great news and very pleased in that regard. Overall I think we are making progress here though she still has a more expansive issue of her lower extremities which again does not appear to be infectious in nature she has had multiple negative cultures. Nonetheless I feel like this may be something more inflammatory she does see a rheumatologist in the upcoming week. Integumentary (Hair, Skin) Wound #2 status is Open. Original cause of wound was Gradually Appeared. The date acquired was: 07/08/2021. The wound has been in treatment 15 weeks. The wound is located on the Right,Lateral Lower Leg. The wound measures 5.3cm length x 1.2cm width x 0.3cm depth; 4.995cm^2 area and 1.499cm^3 volume. There is Fat Layer (Subcutaneous Tissue) exposed. There is a medium amount of serosanguineous drainage noted. There is large (67-100%) red granulation within the wound bed. There is a small (1-33%) amount of necrotic tissue within the wound bed including Adherent Slough. Wound #6 status is Open. Original cause of wound was Shear/Friction. The date acquired was:  03/01/2022. The wound has been in treatment 2 weeks. The wound is located on the Left,Anterior Lower Leg. The wound measures 0.7cm length x 0.7cm width x 0.1cm depth; 0.385cm^2 area and 0.038cm^3 volume. There is Fat Layer (Subcutaneous Tissue) exposed. There is a medium amount of serosanguineous drainage noted. The wound margin is flat and intact. There is no granulation within the wound bed. There is a small (1-33%) amount of necrotic tissue within the wound bed including Adherent Slough. Assessment Active Problems ICD-10 Chronic venous hypertension (idiopathic) with ulcer and inflammation of bilateral lower extremity Non-pressure chronic ulcer of other part of right lower leg with fat layer exposed Cellulitis of right lower limb Laceration without foreign body of left forearm, initial encounter Non-pressure chronic ulcer of other part of left lower leg with fat layer exposed Other specified peripheral vascular diseases Acquired absence of other right toe(s) Procedures Wound #2 Pre-procedure diagnosis of Wound #2 is a Venous Leg Ulcer located on the Right,Lateral Lower Leg .Severity of Tissue Pre Debridement is: Fat layer exposed. There was a Excisional Skin/Subcutaneous Tissue Debridement with a total area of 1.25 sq cm performed by  Jeri Cos E., PA-C. With the following instrument(s): Curette to remove Viable and Non-Viable tissue/material. Material removed includes Subcutaneous Tissue and Slough and. A time out was conducted at 13:14, prior to the start of the procedure. A Minimum amount of bleeding was controlled with Pressure. The procedure was tolerated well. Post Debridement Measurements: 5.3cm length x 1.2cm width x 0.4cm depth; 1.998cm^3 volume. Character of Wound/Ulcer Post Debridement is stable. Severity of Tissue Post Debridement is: Fat layer exposed. Post procedure Diagnosis Wound #2: Same as Pre-Procedure BRANDIS, WIXTED ([id number]) [id number]  Page 8 of 9 Plan Follow-up Appointments: Return Appointment in 1 week. Bathing/ Shower/ Hygiene: May shower; gently cleanse wound with antibacterial soap, rinse and pat dry prior to dressing wounds - Hibiclens recommended for washing right lower leg prior to dressing changes. You may pick this up at a pharmacy or purchase on [place] No tub bath. Edema Control - Lymphedema / Segmental Compressive Device / Other: Tubigrip double layer applied - Tubi D double layer Elevate, Exercise Daily and Avoid Standing for Long Periods of Time. Elevate legs to the level of the heart and pump ankles as often as possible Elevate leg(s) parallel to the floor when sitting. WOUND #2: - Lower Leg Wound Laterality: Right, Lateral Cleanser: Hibiclens, 16 (oz) 3 x Per Week/30 Days Discharge Instructions: Put Hibiclens on your skin and rub it in gently for five minutes with a washcloth. Turn the water back on and rinse very well with warm water. Do not use your regular soap after using and rinsing Hibiclens. Pat yourself dry with a clean towel. Prim Dressing: Silvercel 4 1/4x 4 1/4 (in/in) 3 x Per Week/30 Days ary Discharge Instructions: Apply Silvercel 4 1/4x 4 1/4 (in/in) as instructed Secondary Dressing: ABD Pad 5x9 (in/in) 3 x Per Week/30 Days Discharge Instructions: Cover with ABD pad Secured With: Medipore T - 11M Medipore H Soft Cloth Surgical T ape ape, 2x2 (in/yd) 3 x Per Week/30 Days Secured With: Conform 4'' - Conforming Stretch Gauze Bandage 4x75 (in/in) 3 x Per Week/30 Days Discharge Instructions: Apply as directed Secured With: Tubigrip Size D, 3x10 (in/yd) 3 x Per Week/30 Days WOUND #6: - Lower Leg Wound Laterality: Left, Anterior Cleanser: Hibiclens, 16 (oz) 3 x Per Week/30 Days Discharge Instructions: Put Hibiclens on your skin and rub it in gently for five minutes with a washcloth. Turn the water back on and rinse very well with warm water. Do not use your regular soap after using and  rinsing Hibiclens. Pat yourself dry with a clean towel. Prim Dressing: Silvercel 4 1/4x 4 1/4 (in/in) 3 x Per Week/30 Days ary Discharge Instructions: Apply Silvercel 4 1/4x 4 1/4 (in/in) as instructed Secondary Dressing: ABD Pad 5x9 (in/in) 3 x Per Week/30 Days Discharge Instructions: Cover with ABD pad Secured With: Medipore T - 11M Medipore H Soft Cloth Surgical T ape ape, 2x2 (in/yd) 3 x Per Week/30 Days Secured With: Conform 4'' - Conforming Stretch Gauze Bandage 4x75 (in/in) 3 x Per Week/30 Days Discharge Instructions: Apply as directed Secured With: Tubigrip Size D, 3x10 (in/yd) 3 x Per Week/30 Days 1. I am good recommend that we have the patient continue to monitor for any signs of worsening or infection. I really feel like that she is doing quite well currently with regard to Hibiclens wash over the legs followed by utilizing the silver cell to the wound bed which seems to be doing quite well. 2. Also did recommend that we have the patient continue with the roll gauze  and ABD pad to secure in place and then Tubigrip size D to follow. We will see patient back for reevaluation in 1 week here in the clinic. If anything worsens or changes patient will contact our office for additional recommendations. Electronic Signature(s) Signed: 03/21/2022 11:12:19 AM By: Worthy Keeler PA-C Entered By: Worthy Keeler on 03/21/2022 14:12:19 -------------------------------------------------------------------------------- SuperBill Details Patient Name: Date of Service: Graylin Shiver [name] 03/21/2022 Medical Record Number: [id number] Patient Account Number: [id number] Date of Birth/Sex: Treating RN: [birth year] (71 y.o. [name] Primary Care Provider: [name] Other Clinician: Massie Kluver Referring Provider: Treating Provider/Extender: [name] Weeks in Treatment: [address] Diagnosis Coding ICD-10 Codes SHAYANNE, GOMM ([id number]) [id number]  Page 9 of 9 Code Description I87.333 Chronic venous hypertension (idiopathic) with ulcer and inflammation of bilateral lower extremity L97.812 Non-pressure chronic ulcer of other part of right lower leg with fat layer exposed L03.115 Cellulitis of right lower limb S51.812A Laceration without foreign body of left forearm, initial encounter L97.822 Non-pressure chronic ulcer of other part of left lower leg with fat layer exposed I73.89 Other specified peripheral vascular diseases Z89.421 Acquired absence of other right toe(s) Facility Procedures : CPT4 Code: [id number] 1 Description: 6286 - DEB SUBQ TISSUE 20 SQ CM/< ICD-10 Diagnosis Description L97.812 Non-pressure chronic ulcer of other part of right lower leg with fat layer exp Modifier: osed Quantity: 1 Physician Procedures : CPT4 Code Description Modifier [id number] 11042 - WC PHYS SUBQ TISS 20 SQ CM ICD-10 Diagnosis Description A57.903 Non-pressure chronic ulcer of other part of right lower leg with fat layer exposed Quantity: 1 Electronic Signature(s) Signed: 03/21/2022 2:12:59 PM By: Worthy Keeler PA-C Entered By: Worthy Keeler on 03/21/2022 14:12:59

## 2022-03-24 NOTE — Progress Notes (Signed)
SAPHIA, VANDERFORD ([id number]) [id number] Page 1 of 10 Visit Report for 03/21/2022 Arrival Information Details Patient Name: Date of Service: [name], [name]. 03/21/2022 12:45 PM Medical Record Number: [id number] Patient Account Number: [id number] Date of Birth/Sex: Treating RN: [birth year] (71 y.o. [name] Primary Care [name]: [name] Other Clinician: Massie Kluver Referring Kendrick Remigio: Treating [name]/Extender: [name] Weeks in Treatment: 15 Visit Information History Since Last Visit All ordered tests and consults were completed: No Patient Arrived: Ambulatory Added or deleted any medications: No Arrival Time: 09:51 Any new allergies or adverse reactions: No Transfer Assistance: None Had a fall or experienced change in No Patient Requires Transmission-Based Precautions: No activities of daily living that may affect Patient Has Alerts: Yes risk of falls: Patient Alerts: Patient on Blood Thinner Hospitalized since last visit: No Pain Present Now: Yes Electronic Signature(s) Signed: 03/24/2022 11:23:10 AM By: Massie Kluver Entered By: Massie Kluver on 03/21/2022 12:52:06 -------------------------------------------------------------------------------- Clinic Level of Care Assessment Details Patient Name: Date of Service: [name], [name] 03/21/2022 12:45 PM Medical Record Number: [id number] Patient Account Number: [id number] Date of Birth/Sex: Treating RN: 12-19-1950 (71 y.o. [name] Primary Care Khaleb Broz: [name] Other Clinician: Massie Kluver Referring [name]: Treating Mikael Skoda/Extender: [name] Weeks in Treatment: 15 Clinic Level of Care Assessment Items TOOL 1 Quantity Score '[]'$  - 0 Use when EandM and Procedure is performed on INITIAL visit ASSESSMENTS - Nursing Assessment / Reassessment '[]'$  - 0 General Physical Exam (combine w/ comprehensive assessment (listed just below) when  performed on new pt. evals) '[]'$  - 0 Comprehensive Assessment (HX, ROS, Risk Assessments, Wounds Hx, etc.) ASSESSMENTS - Wound and Skin Assessment / Reassessment '[]'$  - 0 Dermatologic / Skin Assessment (not related to wound area) ASSESSMENTS - Ostomy and/or Continence Assessment and Bison, Butterfield ([id number]) [id number] Page 2 of 10 '[]'$  - 0 Incontinence Assessment and Management '[]'$  - 0 Ostomy Care Assessment and Management (repouching, etc.) PROCESS - Coordination of Care '[]'$  - 0 Simple Patient / Family Education for ongoing care '[]'$  - 0 Complex (extensive) Patient / Family Education for ongoing care '[]'$  - 0 Staff obtains Programmer, systems, Records, T Results / Process Orders est '[]'$  - 0 Staff telephones HHA, Nursing Homes / Clarify orders / etc '[]'$  - 0 Routine Transfer to another Facility (non-emergent condition) '[]'$  - 0 Routine Hospital Admission (non-emergent condition) '[]'$  - 0 New Admissions / Biomedical engineer / Ordering NPWT Apligraf, etc. , '[]'$  - 0 Emergency Hospital Admission (emergent condition) PROCESS - Special Needs '[]'$  - 0 Pediatric / Minor Patient Management '[]'$  - 0 Isolation Patient Management '[]'$  - 0 Hearing / Language / Visual special needs '[]'$  - 0 Assessment of Community assistance (transportation, D/C planning, etc.) '[]'$  - 0 Additional assistance / Altered mentation '[]'$  - 0 Support Surface(s) Assessment (bed, cushion, seat, etc.) INTERVENTIONS - Miscellaneous '[]'$  - 0 External ear exam '[]'$  - 0 Patient Transfer (multiple staff / Civil Service fast streamer / Similar devices) '[]'$  - 0 Simple Staple / Suture removal (25 or less) '[]'$  - 0 Complex Staple / Suture removal (26 or more) '[]'$  - 0 Hypo/Hyperglycemic Management (do not check if billed separately) '[]'$  - 0 Ankle / Brachial Index (ABI) - do not check if billed separately Has the patient been seen at the hospital within the last three years: Yes Total Score: 0 Level Of Care: ____ Electronic  Signature(s) Signed: 03/24/2022 11:23:10 AM By: Massie Kluver Entered By: Massie Kluver on 03/21/2022 13:18:13 -------------------------------------------------------------------------------- Encounter Discharge Information Details  Patient Name: Date of Service: [name], [name]. 03/21/2022 12:45 PM Medical Record Number: [id number] Patient Account Number: [id number] Date of Birth/Sex: Treating RN: 01/20/1951 (71 y.o. [name] Primary Care Ikia Cincotta: [name] Other Clinician: Massie Kluver Referring Matsuko Kretz: Treating Eyvonne Burchfield/Extender: [name] Weeks in Treatment: 15 Encounter Discharge Information Items Post Procedure Vitals Discharge Condition: Stable Temperature (F): 98.3 [name], [name] ([id number]) [id number] Page 3 of 10 Ambulatory Status: Ambulatory Pulse (bpm): 90 Discharge Destination: Home Respiratory Rate (breaths/min): 16 Transportation: Private Auto Blood Pressure (mmHg): 119/78 Accompanied By: self Schedule Follow-up Appointment: Yes Clinical Summary of Care: Electronic Signature(s) Signed: 03/24/2022 11:23:10 AM By: Massie Kluver Entered By: Massie Kluver on 03/21/2022 13:27:01 -------------------------------------------------------------------------------- Lower Extremity Assessment Details Patient Name: Date of Service: [name], Brandy [name]. 03/21/2022 12:45 PM Medical Record Number: [id number] Patient Account Number: [id number] Date of Birth/Sex: Treating RN: January 02, 1951 (71 y.o. [name] Primary Care Cassi Jenne: [name] Other Clinician: Massie Kluver Referring Adriann Ballweg: Treating Vannie Hilgert/Extender: [name] Weeks in Treatment: 15 Edema Assessment Assessed: [Left: Yes] [Right: Yes] Edema: [Left: Yes] [Right: Yes] Calf Left: Right: Point of Measurement: 32 cm From Medial Instep 31.3 cm 33.4 cm Ankle Left: Right: Point of Measurement: 10 cm From Medial Instep 24.6 cm 22.7  cm Vascular Assessment Pulses: Dorsalis Pedis Palpable: [Left:Yes] [Right:Yes] Posterior Tibial Palpable: [Left:Yes] [Right:Yes] Electronic Signature(s) Signed: 03/21/2022 12:28:09 PM By: Gretta Cool, BSN, RN, CWS, [name] RN, BSN Signed: 03/24/2022 11:23:10 AM By: Massie Kluver Entered By: Massie Kluver on 03/21/2022 13:05:08 [place], [name] ([id number]) [id number] Page 4 of 10 -------------------------------------------------------------------------------- Multi Wound Chart Details Patient Name: Date of Service: [name], NEARY. 03/21/2022 12:45 PM Medical Record Number: [id number] Patient Account Number: [id number] Date of Birth/Sex: Treating RN: 01/06/1951 (71 y.o. [name], [name] Primary Care Latondra Gebhart: [name] Other Clinician: Massie Kluver Referring Jessalynn Mccowan: Treating Albaraa Swingle/Extender: [name] Weeks in Treatment: 15 Vital Signs Height(in): 63 Pulse(bpm): 90 Weight(lbs): 147 Blood Pressure(mmHg): 119/78 Body Mass Index(BMI): 26 Temperature(F): 98.3 Respiratory Rate(breaths/min): 16 [2:Photos:] [N/A:N/A] Right, Lateral Lower Leg Left, Anterior Lower Leg N/A Wound Location: Gradually Appeared Shear/Friction N/A Wounding Event: Venous Leg Ulcer Trauma, Other N/A Primary Etiology: Hypertension, Peripheral Arterial Hypertension, Peripheral Arterial N/A Comorbid History: Disease Disease 07/08/2021 03/01/2022 N/A Date Acquired: 15 2 N/A Weeks of Treatment: Open Open N/A Wound Status: No No N/A Wound Recurrence: 5.3x1.2x0.3 0.7x0.7x0.1 N/A Measurements L x W x D (cm) 4.995 0.385 N/A A (cm) : rea 1.499 0.038 N/A Volume (cm) : 43.20% 84.90% N/A % Reduction in Area: 57.40% 85.00% N/A % Reduction in Volume: Full Thickness Without Exposed Partial Thickness N/A Classification: Support Structures Medium Medium N/A Exudate Amount: Serosanguineous Serosanguineous N/A Exudate Type: red, brown red, brown N/A Exudate  Color: N/A Flat and Intact N/A Wound Margin: Large (67-100%) None Present (0%) N/A Granulation Amount: Red N/A N/A Granulation Quality: Small (1-33%) Small (1-33%) N/A Necrotic Amount: Fat Layer (Subcutaneous Tissue): Yes Fat Layer (Subcutaneous Tissue): Yes N/A Exposed Structures: Fascia: No Fascia: No Tendon: No Tendon: No Muscle: No Muscle: No Joint: No Joint: No Bone: No Bone: No None None N/A Epithelialization: Treatment Notes Electronic Signature(s) Signed: 03/24/2022 11:23:10 AM By: Massie Kluver Entered By: Massie Kluver on 03/21/2022 13:07:26 [place], [name] ([id number]) [id number] Page 5 of 10 -------------------------------------------------------------------------------- Multi-Disciplinary Care Plan Details Patient Name: Date of Service: [name], INTHAVONG. 03/21/2022 12:45 PM Medical Record Number: [id number] Patient Account Number: [id number] Date of Birth/Sex: Treating RN: 17-Jan-1951 (71 y.o. [name] Primary  Care Renley Banwart: [name] Other Clinician: Massie Kluver Referring Ruthe Roemer: Treating Zaiah Credeur/Extender: [name] Weeks in Treatment: 15 Active Inactive Wound/Skin Impairment Nursing Diagnoses: Knowledge deficit related to ulceration/compromised skin integrity Goals: Patient/caregiver will verbalize understanding of skin care regimen Date Initiated: 11/30/2021 Target Resolution Date: 12/30/2021 Goal Status: Active Ulcer/skin breakdown will have a volume reduction of 30% by week 4 Date Initiated: 11/30/2021 Target Resolution Date: 01/30/2022 Goal Status: Active Ulcer/skin breakdown will have a volume reduction of 50% by week 8 Date Initiated: 11/30/2021 Target Resolution Date: 03/02/2022 Goal Status: Active Ulcer/skin breakdown will have a volume reduction of 80% by week 12 Date Initiated: 11/30/2021 Target Resolution Date: 04/01/2022 Goal Status: Active Ulcer/skin breakdown will heal within 14  weeks Date Initiated: 11/30/2021 Target Resolution Date: 05/02/2022 Goal Status: Active Interventions: Assess patient/caregiver ability to obtain necessary supplies Assess patient/caregiver ability to perform ulcer/skin care regimen upon admission and as needed Assess ulceration(s) every visit Notes: Electronic Signature(s) Signed: 03/21/2022 12:28:09 PM By: Gretta Cool, BSN, RN, CWS, [name] RN, BSN Signed: 03/24/2022 11:23:10 AM By: Massie Kluver Entered By: Massie Kluver on 03/21/2022 13:07:20 Pain Assessment Details -------------------------------------------------------------------------------- [name] ([id number]) [id number] Page 6 of 10 Patient Name: Date of Service: [name], WILLETS. 03/21/2022 12:45 PM Medical Record Number: [id number] Patient Account Number: [id number] Date of Birth/Sex: Treating RN: Dec 19, 1950 (71 y.o. [name] Primary Care Resha Filippone: [name] Other Clinician: Massie Kluver Referring Ibrohim Simmers: Treating Datrell Dunton/Extender: [name] Weeks in Treatment: 15 Active Problems Location of Pain Severity and Description of Pain Patient Has Paino Yes Site Locations Pain Location: Generalized Pain, Pain in Ulcers Rate the pain. Current Pain Level: 8 Character of Pain Describe the Pain: Aching, Burning Pain Management and Medication Current Pain Management: Medication: Yes Rest: Yes Electronic Signature(s) Signed: 03/21/2022 12:28:09 PM By: Gretta Cool, BSN, RN, CWS, [name] RN, BSN Signed: 03/24/2022 11:23:10 AM By: Massie Kluver Entered By: Massie Kluver on 03/21/2022 13:07:11 -------------------------------------------------------------------------------- Patient/Caregiver Education Details Patient Name: Date of Service: [name] 10/16/2023andnbsp12:45 PM Medical Record Number: [id number] Patient Account Number: [id number] Date of Birth/Gender: Treating RN: 1950/10/31 (71 y.o. [name] Primary  Care Physician: [name] Other Clinician: Massie Kluver Referring Physician: Treating Physician/Extender: [name] Weeks in Treatment: 15 Education Assessment Education Provided To: Patient Education Topics Provided Wound/Skin ImpairmentBRAELYN, [name] ([id number]) [id number] Page 7 of 10 Handouts: Other: continue wound care as directed Methods: Explain/Verbal Responses: State content correctly Electronic Signature(s) Signed: 03/24/2022 11:23:10 AM By: Massie Kluver Entered By: Massie Kluver on 03/21/2022 13:18:36 -------------------------------------------------------------------------------- Wound Assessment Details Patient Name: Date of Service: Brandy Shiver [name]. 03/21/2022 12:45 PM Medical Record Number: [id number] Patient Account Number: [id number] Date of Birth/Sex: Treating RN: Dec 23, 1950 (71 y.o. [name], [name] Primary Care Catherin Doorn: [name] Other Clinician: Massie Kluver Referring Sabel Hornbeck: Treating Zaylin Runco/Extender: [name] Weeks in Treatment: 15 Wound Status Wound Number: 2 Primary Etiology: Venous Leg Ulcer Wound Location: Right, Lateral Lower Leg Wound Status: Open Wounding Event: Gradually Appeared Comorbid History: Hypertension, Peripheral Arterial Disease Date Acquired: 07/08/2021 Weeks Of Treatment: 15 Clustered Wound: No Photos Wound Measurements Length: (cm) 5.3 Width: (cm) 1.2 Depth: (cm) 0.3 Area: (cm) 4.995 Volume: (cm) 1.499 % Reduction in Area: 43.2% % Reduction in Volume: 57.4% Epithelialization: None Wound Description Classification: Full Thickness Without Exposed Support Structures Exudate Amount: Medium Exudate Type: Serosanguineous Exudate Color: red, brown Foul Odor After Cleansing: No Slough/Fibrino Yes Wound Bed Granulation Amount: Large (67-100%) Exposed Structure Granulation Quality: Red Fascia  Exposed: No Necrotic Amount: Small (1-33%) Fat Layer  (Subcutaneous Tissue) Exposed: Yes Necrotic Quality: Adherent Slough Tendon Exposed: No Muscle Exposed: No [name], [name] ([id number]) [id number] Page 8 of 10 Joint Exposed: No Bone Exposed: No Treatment Notes Wound #2 (Lower Leg) Wound Laterality: Right, Lateral Cleanser Hibiclens, 16 (oz) Discharge Instruction: Put Hibiclens on your skin and rub it in gently for five minutes with a washcloth. Turn the water back on and rinse very well with warm water. Do not use your regular soap after using and rinsing Hibiclens. Pat yourself dry with a clean towel. Peri-Wound Care Topical Primary Dressing Silvercel 4 1/4x 4 1/4 (in/in) Discharge Instruction: Apply Silvercel 4 1/4x 4 1/4 (in/in) as instructed Secondary Dressing ABD Pad 5x9 (in/in) Discharge Instruction: Cover with ABD pad Secured With Medipore T - 48M Medipore H Soft Cloth Surgical T ape ape, 2x2 (in/yd) Conform 4'' - Conforming Stretch Gauze Bandage 4x75 (in/in) Discharge Instruction: Apply as directed Tubigrip Size D, 3x10 (in/yd) Compression Wrap Compression Stockings Add-Ons Electronic Signature(s) Signed: 03/21/2022 12:28:09 PM By: Gretta Cool, BSN, RN, CWS, [name] RN, BSN Signed: 03/24/2022 11:23:10 AM By: Massie Kluver Entered By: Massie Kluver on 03/21/2022 13:02:59 -------------------------------------------------------------------------------- Wound Assessment Details Patient Name: Date of Service: Brandy Shiver [name]. 03/21/2022 12:45 PM Medical Record Number: [id number] Patient Account Number: [id number] Date of Birth/Sex: Treating RN: 10-02-1950 (71 y.o. [name] Primary Care Dwanda Tufano: [name] Other Clinician: Massie Kluver Referring Braniya Farrugia: Treating Goerge Mohr/Extender: [name] Weeks in Treatment: 15 Wound Status Wound Number: 6 Primary Etiology: Trauma, Other Wound Location: Left, Anterior Lower Leg Wound Status: Open Wounding Event:  Shear/Friction Comorbid History: Hypertension, Peripheral Arterial Disease Date Acquired: 03/01/2022 Weeks Of Treatment: 2 Clustered Wound: No Photos MERIKAY, LESNIEWSKI ([id number]) [id number] Page 9 of 10 Wound Measurements Length: (cm) 0.7 Width: (cm) 0.7 Depth: (cm) 0.1 Area: (cm) 0.385 Volume: (cm) 0.038 % Reduction in Area: 84.9% % Reduction in Volume: 85% Epithelialization: None Wound Description Classification: Partial Thickness Wound Margin: Flat and Intact Exudate Amount: Medium Exudate Type: Serosanguineous Exudate Color: red, brown Foul Odor After Cleansing: No Slough/Fibrino Yes Wound Bed Granulation Amount: None Present (0%) Exposed Structure Necrotic Amount: Small (1-33%) Fascia Exposed: No Necrotic Quality: Adherent Slough Fat Layer (Subcutaneous Tissue) Exposed: Yes Tendon Exposed: No Muscle Exposed: No Joint Exposed: No Bone Exposed: No Treatment Notes Wound #6 (Lower Leg) Wound Laterality: Left, Anterior Cleanser Hibiclens, 16 (oz) Discharge Instruction: Put Hibiclens on your skin and rub it in gently for five minutes with a washcloth. Turn the water back on and rinse very well with warm water. Do not use your regular soap after using and rinsing Hibiclens. Pat yourself dry with a clean towel. Peri-Wound Care Topical Primary Dressing Silvercel 4 1/4x 4 1/4 (in/in) Discharge Instruction: Apply Silvercel 4 1/4x 4 1/4 (in/in) as instructed Secondary Dressing ABD Pad 5x9 (in/in) Discharge Instruction: Cover with ABD pad Secured With [place] H Soft Cloth Surgical T ape ape, 2x2 (in/yd) Conform 4'' - Conforming Stretch Gauze Bandage 4x75 (in/in) Discharge Instruction: Apply as directed Tubigrip Size D, 3x10 (in/yd) Compression Wrap Compression Stockings Add-Ons Electronic Signature(s) Signed: 03/21/2022 12:28:09 PM By: Gretta Cool, BSN, RN, CWS, [name] RN, BSN Signed: 03/24/2022 11:23:10 AM By: Massie Kluver Entered By: Massie Kluver on 03/21/2022 13:03:36 [place], [name] ([id number]) [id number] Page 10 of 10 -------------------------------------------------------------------------------- Vitals Details Patient Name: Date of Service: ORALIA, CRIGER. 03/21/2022 12:45 PM Medical Record Number: [id number] Patient Account Number: [id number] Date of Birth/Sex:  Treating RN: [birth year] (71 y.o. [name], [name] Primary Care Darlen Gledhill: [name] Other Clinician: Massie Kluver Referring Euell Schiff: Treating Docie Abramovich/Extender: [name] Weeks in Treatment: 15 Vital Signs Time Taken: 13:06 Temperature (F): 98.3 Height (in): 63 Pulse (bpm): 90 Weight (lbs): 147 Respiratory Rate (breaths/min): 16 Body Mass Index (BMI): 26 Blood Pressure (mmHg): 119/78 Reference Range: 80 - 120 mg / dl Electronic Signature(s) Signed: 03/24/2022 11:23:10 AM By: Massie Kluver Entered By: Massie Kluver on 03/21/2022 13:07:08

## 2022-04-04 ENCOUNTER — Encounter: Payer: Medicare HMO | Admitting: Physician Assistant

## 2022-04-04 DIAGNOSIS — I87333 Chronic venous hypertension (idiopathic) with ulcer and inflammation of bilateral lower extremity: Secondary | ICD-10-CM | POA: Diagnosis not present

## 2022-04-04 NOTE — Progress Notes (Signed)
SHENIAH, SUPAK ([id number]) [id number] Page 1 of 9 Visit Report for 04/04/2022 Chief Complaint Document Details Patient Name: Date of Service: [name], [name]. 04/04/2022 9:00 A M Medical Record Number: [id number] Patient Account Number: [id number] Date of Birth/Sex: Treating RN: 09/21/1950 (71 y.o. [name] Primary Care Provider: [name] Other Clinician: Massie Kluver Referring Provider: Treating Provider/Extender: [name] Weeks in Treatment: 17 Information Obtained from: Patient Chief Complaint Bilateral LE Ulcers Electronic Signature(s) Signed: 04/04/2022 9:24:28 AM By: Worthy Keeler PA-C Entered By: Worthy Keeler on 04/04/2022 09:24:28 -------------------------------------------------------------------------------- Debridement Details Patient Name: Date of Service: Brandy Shiver [name]. 04/04/2022 9:00 A M Medical Record Number: [id number] Patient Account Number: [id number] Date of Birth/Sex: Treating RN: 03/01/1951 (71 y.o. [name] Primary Care Provider: [name] Other Clinician: Massie Kluver Referring Provider: Treating Provider/Extender: [name] Weeks in Treatment: 17 Debridement Performed for Assessment: Wound #2 Right,Lateral Lower Leg Performed By: Physician Tommie Sams., PA-C Debridement Type: Debridement Severity of Tissue Pre Debridement: Fat layer exposed Level of Consciousness (Pre-procedure): Awake and Alert Pre-procedure Verification/Time Out Yes - 09:28 Taken: Start Time: 09:28 T Area Debrided (L x W): otal 2 (cm) x 2 (cm) = 4 (cm) Tissue and other material debrided: Viable, Non-Viable, Slough, Subcutaneous, Slough Level: Skin/Subcutaneous Tissue Debridement Description: Excisional Instrument: Curette Bleeding: Minimum Hemostasis Achieved: Pressure End Time: 09:32 Response to Treatment: Procedure was tolerated well Level of Consciousness (Post- Awake and  Alert procedure): [name], [name] ([id number]) [id number] Page 2 of 9 Post Debridement Measurements of Total Wound Length: (cm) 5.1 Width: (cm) 0.9 Depth: (cm) 0.2 Volume: (cm) 0.721 Character of Wound/Ulcer Post Debridement: Stable Severity of Tissue Post Debridement: Fat layer exposed Post Procedure Diagnosis Same as Pre-procedure Electronic Signature(s) Signed: 04/04/2022 4:57:47 PM By: Worthy Keeler PA-C Signed: 04/04/2022 5:32:10 PM By: Massie Kluver Signed: 04/05/2022 12:02:04 PM By: Gretta Cool, BSN, RN, CWS, [name] RN, BSN Entered By: Massie Kluver on 04/04/2022 09:33:35 -------------------------------------------------------------------------------- HPI Details Patient Name: Date of Service: Brandy Shiver [name]. 04/04/2022 9:00 A M Medical Record Number: [id number] Patient Account Number: [id number] Date of Birth/Sex: Treating RN: [birth year] (71 y.o. [name] Primary Care Provider: [name] Other Clinician: Massie Kluver Referring Provider: Treating Provider/Extender: [name] Weeks in Treatment: 17 History of Present Illness HPI Description: 11-30-2021 upon evaluation today patient presents for initial inspection here in our clinic concerning wounds that she has over the right lateral leg as well as the right medial leg. Subsequently the patient is dealing with lower extremity edema which I do believe has been one of the big causes of what is going on here. She also does have what appears to be cellulitis of the leg as well as peripheral vascular disease. She already has amputation of a toe on the right foot. With that being said I did review that surgical pathology from the amputation this was the right third toe which showed a gangrenous ulcer with underlying cellulitis negative for acute osteomyelitis but acute cellulitis was present in the proximal margin. There was also noted on this pathology report a biopsy from the  right leg which showed a gangrenous ulcer with underlying cellulitis and foreign body type giant cell reaction. That was performed on 11-11-2021 and resulted on 11-15-2021 She is also had lower extremity arterial duplex studies which did show that she had falsely elevated ABI on the right with a TBI of 0.45. On the left she had an ABI of 1.05  with a TBI of 0.12. She is pretty much monophasic throughout. With that being said there was definite reason for concern with regard to her blood flow into the legs she does have a follow-up with the vascular surgeon coming up shortly as well. 12-14-2021 upon evaluation today patient appears to be doing decently well currently in regard to her wound. We are definitely seeing some signs of improvement there is definitely some necrotic tissue as well but try to clean some this away carefully today. Fortunately I think that she is going to tolerate the debridement without complication she otherwise seems to be healing excellent. I think get a lot of the dead tissue clearway will make a big difference as far as her healing is concerned. 12-21-2021 upon evaluation today patient appears to be doing well currently in regard to her legs she has been using the Hibiclens which she tells me seems to be helping. She tells me the leg feels much better washing with this compared to what it was previous which is great news. Fortunately I do not see any signs of active infection and I am overall extremely pleased with where we stand. I do think we are on the right track here. 01-04-2022 upon evaluation today patient appears to be doing unfortunately somewhat worse in regard to the new pustule areas that are appearing on her lower extremity. Again I have discussed with her based on what I am seeing that though she does have a wound I do not feel like these new pustule areas are related to the wound in particular. Again I am unsure of exactly what is going on and I really think she would  benefit from seeing a dermatology specialist. She voiced understanding and is not opposed to this she did see a dermatologist in [place] but they were under the impression they were just to do a biopsy which she had already had and therefore really did not do much she tells me that so I really know about that encounter. With that being said in general I do feel like that the patient is doing better although that is more in relation to the wound I think these new pustule areas still have me concerned. I do think she could be a candidate for topical antibiotics such as [employer] but at the same time I do think that we need to continue to monitor for any signs of overall worsening infection in the interim. 01-11-2022 upon evaluation today patient appears to be doing well currently in regard to her wounds nothing seems to be doing worse although she continues to have issues with multiple pustules forming I am really not certain that this is actually infection. I am beginning to suspect something rheumatologic/autoimmune. With that being said she did see dermatology today and it was felt by the dermatologist that this is likely infectious despite the fact we had a negative culture from when I saw her last week. They did repeat a culture on one of the pustules. 01-25-2022 upon evaluation today patient appears to be doing better currently in regard to her wounds in general. Everything is showing signs of improvement [name], [name] ([id number]) [id number] Page 3 of 9 which is great news. Fortunately I see no evidence of active infection locally or systemically at this time which is great news. She does have a new skin tear on her forearm but this she did try to pull back over and has done quite well. 02-08-2022 upon evaluation today patient appears to  be doing excellent currently in regard to her wound. She has been tolerating the dressing changes without complication and again she  still seems to be making progress. Despite this she still has a lot of blisters that keep popping up again I have cultured them and they have been cultured twice other once at dermatology once I believe with primary care never have we identified any causative organisms. I am beginning to believe in fact have made a referral to Dr. Posey Pronto, rheumatology, in order to see if there is some other type of inflammatory process they were dealing with here. I think that this is becoming increasingly likely. 02-21-2022 upon evaluation today patient appears to be doing well currently in regard to her wounds. In fact everything is showing signs of improvement which is good news she has found some compression that she was interested in that seems to have been doing extremely well at this point. I feel like that may be part of what is improving the here overall. 03-07-2022 upon evaluation today patient actually is making some good progress here in regard to her right leg I am very pleased in this regard. With that being said she does have a wound on her left leg which does appear to be a venous leg ulcer that occurred as a result of a blister that arose and then burst over the past week. Fortunately there does not appear to be any evidence of active infection at this time which is great news. No fevers, chills, nausea, vomiting, or diarrhea. 03-21-2022 upon evaluation patient's wound is actually showing signs of significant improvement which is great news and overall I am extremely pleased with where we stand today. I do not see any evidence of infection locally or systemically which is great news. No fevers, chills, nausea, vomiting, or diarrhea. 04-04-2022 upon evaluation today patient appears to be doing better in regard to her right lateral ulcer. The anterior ulcer on the leg is also doing better. She still has several of these pustules that come and go but again they are not significant compared to last time I saw  her even. I do believe the clobetasol that was given by dermatology has been of benefit as well and this is great news. Electronic Signature(s) Signed: 04/04/2022 10:43:03 AM By: Worthy Keeler PA-C Entered By: Worthy Keeler on 04/04/2022 10:43:02 -------------------------------------------------------------------------------- Physical Exam Details Patient Name: Date of Service: MICOLE, DELEHANTY [name]. 04/04/2022 9:00 A M Medical Record Number: [id number] Patient Account Number: [id number] Date of Birth/Sex: Treating RN: 16-Sep-1950 (71 y.o. [name] Primary Care Provider: [name] Other Clinician: Massie Kluver Referring Provider: Treating Provider/Extender: [name] Weeks in Treatment: 49 Constitutional Well-nourished and well-hydrated in no acute distress. Respiratory normal breathing without difficulty. Psychiatric this patient is able to make decisions and demonstrates good insight into disease process. Alert and Oriented x 3. pleasant and cooperative. Notes Upon inspection patient's wound bed actually showed signs of good granulation and epithelization in fact she has a lot of feeling in the wound on the right lateral leg. She also did see Dr. Posey Pronto, rheumatology, they performed a lot of blood work to check and see if they can identify exactly what was going on from an inflammation standpoint here. Again we have not heard any results of that yet hopefully by next week we may have some additional results to know a little bit more about what is going on here. Electronic Signature(s) Signed: 04/04/2022 10:43:35 AM By: Joaquim Lai  III, Jennavie Martinek PA-C Entered By: Worthy Keeler on 04/04/2022 10:43:35 Coale, [name] ([id number]) [id number] Page 4 of 9 -------------------------------------------------------------------------------- Physician Orders Details Patient Name: Date of Service: MAEBELL, LYVERS. 04/04/2022 9:00 A M Medical Record  Number: [id number] Patient Account Number: [id number] Date of Birth/Sex: Treating RN: December 24, 1950 (71 y.o. [name], [name] Primary Care Provider: [name] Other Clinician: Massie Kluver Referring Provider: Treating Provider/Extender: [name] Weeks in Treatment: 59 Verbal / Phone Orders: No Diagnosis Coding ICD-10 Coding Code Description [phone] Chronic venous hypertension (idiopathic) with ulcer and inflammation of bilateral lower extremity L97.812 Non-pressure chronic ulcer of other part of right lower leg with fat layer exposed L03.115 Cellulitis of right lower limb S51.812A Laceration without foreign body of left forearm, initial encounter L97.822 Non-pressure chronic ulcer of other part of left lower leg with fat layer exposed I73.89 Other specified peripheral vascular diseases Z89.421 Acquired absence of other right toe(s) Follow-up Appointments Return Appointment in 1 week. Bathing/ Shower/ Hygiene May shower; gently cleanse wound with antibacterial soap, rinse and pat dry prior to dressing wounds - Hibiclens recommended for washing right lower leg prior to dressing changes. You may pick this up at a pharmacy or purchase on [facility] No tub bath. Edema Control - Lymphedema / Segmental Compressive Device / Other Tubigrip double layer applied - Tubi D double layer Elevate, Exercise Daily and A void Standing for Long Periods of Time. Elevate legs to the level of the heart and pump ankles as often as possible Elevate leg(s) parallel to the floor when sitting. Wound Treatment Wound #2 - Lower Leg Wound Laterality: Right, Lateral Cleanser: Hibiclens, 16 (oz) 3 x Per Week/30 Days Discharge Instructions: Put Hibiclens on your skin and rub it in gently for five minutes with a washcloth. Turn the water back on and rinse very well with warm water. Do not use your regular soap after using and rinsing Hibiclens. Pat yourself dry with a clean towel. Topical: Clobetasol  Propionate ointment 0.05%, 60 (g) tube 3 x Per Week/30 Days Discharge Instructions: Apply as directed. Given by Dermatologist Prim Dressing: Silvercel 4 1/4x 4 1/4 (in/in) 3 x Per Week/30 Days ary Discharge Instructions: Apply Silvercel 4 1/4x 4 1/4 (in/in) as instructed Secondary Dressing: ABD Pad 5x9 (in/in) 3 x Per Week/30 Days Discharge Instructions: Cover with ABD pad Secured With: Medipore T - 25M Medipore H Soft Cloth Surgical T ape ape, 2x2 (in/yd) 3 x Per Week/30 Days Secured With: Conform 4'' - Conforming Stretch Gauze Bandage 4x75 (in/in) 3 x Per Week/30 Days Discharge Instructions: Apply as directed Secured With: Tubigrip Size D, 3x10 (in/yd) 3 x Per Week/30 Days Electronic Signature(s) Signed: 04/04/2022 4:57:47 PM By: Worthy Keeler PA-C Signed: 04/04/2022 5:32:10 PM By: Massie Kluver Bazinet,Signed: 04/04/2022 5:32:10 PM By: Augustina Mood ([id number]) [id number] Page 5 of 9 Entered By: Massie Kluver on 04/04/2022 09:36:10 -------------------------------------------------------------------------------- Problem List Details Patient Name: Date of Service: [name], [name]. 04/04/2022 9:00 A M Medical Record Number: [id number] Patient Account Number: [id number] Date of Birth/Sex: Treating RN: Nov 05, 1950 (71 y.o. [name] Primary Care Provider: [name] Other Clinician: Massie Kluver Referring Provider: Treating Provider/Extender: [name] Weeks in Treatment: 17 Active Problems ICD-10 Encounter Code Description Active Date MDM Diagnosis I87.333 Chronic venous hypertension (idiopathic) with ulcer and inflammation of 11/30/2021 No Yes bilateral lower extremity L97.812 Non-pressure chronic ulcer of other part of right lower leg with fat layer 11/30/2021 No Yes exposed L03.115 Cellulitis of right lower  limb 11/30/2021 No Yes S51.812A Laceration without foreign body of left forearm, initial encounter 01/25/2022 No  Yes L97.822 Non-pressure chronic ulcer of other part of left lower leg with fat layer exposed10/07/2021 No Yes I73.89 Other specified peripheral vascular diseases 11/30/2021 No Yes Z89.421 Acquired absence of other right toe(s) 11/30/2021 No Yes Inactive Problems Resolved Problems Electronic Signature(s) Signed: 04/04/2022 9:24:23 AM By: Worthy Keeler PA-C Entered By: Worthy Keeler on 04/04/2022 09:24:23 Frese, [name] ([id number]) [id number] Page 6 of 9 -------------------------------------------------------------------------------- Progress Note Details Patient Name: Date of Service: [name], [name]. 04/04/2022 9:00 A M Medical Record Number: [id number] Patient Account Number: [id number] Date of Birth/Sex: Treating RN: 10/17/1950 (71 y.o. [name] Primary Care Provider: [name] Other Clinician: Massie Kluver Referring Provider: Treating Provider/Extender: [name] Weeks in Treatment: 17 Subjective Chief Complaint Information obtained from Patient Bilateral LE Ulcers History of Present Illness (HPI) 11-30-2021 upon evaluation today patient presents for initial inspection here in our clinic concerning wounds that she has over the right lateral leg as well as the right medial leg. Subsequently the patient is dealing with lower extremity edema which I do believe has been one of the big causes of what is going on here. She also does have what appears to be cellulitis of the leg as well as peripheral vascular disease. She already has amputation of a toe on the right foot. With that being said I did review that surgical pathology from the amputation this was the right third toe which showed a gangrenous ulcer with underlying cellulitis negative for acute osteomyelitis but acute cellulitis was present in the proximal margin. There was also noted on this pathology report a biopsy from the right leg which showed a gangrenous ulcer with  underlying cellulitis and foreign body type giant cell reaction. That was performed on 11-11-2021 and resulted on 11-15-2021 She is also had lower extremity arterial duplex studies which did show that she had falsely elevated ABI on the right with a TBI of 0.45. On the left she had an ABI of 1.05 with a TBI of 0.12. She is pretty much monophasic throughout. With that being said there was definite reason for concern with regard to her blood flow into the legs she does have a follow-up with the vascular surgeon coming up shortly as well. 12-14-2021 upon evaluation today patient appears to be doing decently well currently in regard to her wound. We are definitely seeing some signs of improvement there is definitely some necrotic tissue as well but try to clean some this away carefully today. Fortunately I think that she is going to tolerate the debridement without complication she otherwise seems to be healing excellent. I think get a lot of the dead tissue clearway will make a big difference as far as her healing is concerned. 12-21-2021 upon evaluation today patient appears to be doing well currently in regard to her legs she has been using the Hibiclens which she tells me seems to be helping. She tells me the leg feels much better washing with this compared to what it was previous which is great news. Fortunately I do not see any signs of active infection and I am overall extremely pleased with where we stand. I do think we are on the right track here. 01-04-2022 upon evaluation today patient appears to be doing unfortunately somewhat worse in regard to the new pustule areas that are appearing on her lower extremity. Again I have discussed with her based on  what I am seeing that though she does have a wound I do not feel like these new pustule areas are related to the wound in particular. Again I am unsure of exactly what is going on and I really think she would benefit from seeing a dermatology specialist.  She voiced understanding and is not opposed to this she did see a dermatologist in [place] but they were under the impression they were just to do a biopsy which she had already had and therefore really did not do much she tells me that so I really know about that encounter. With that being said in general I do feel like that the patient is doing better although that is more in relation to the wound I think these new pustule areas still have me concerned. I do think she could be a candidate for topical antibiotics such as [employer] but at the same time I do think that we need to continue to monitor for any signs of overall worsening infection in the interim. 01-11-2022 upon evaluation today patient appears to be doing well currently in regard to her wounds nothing seems to be doing worse although she continues to have issues with multiple pustules forming I am really not certain that this is actually infection. I am beginning to suspect something rheumatologic/autoimmune. With that being said she did see dermatology today and it was felt by the dermatologist that this is likely infectious despite the fact we had a negative culture from when I saw her last week. They did repeat a culture on one of the pustules. 01-25-2022 upon evaluation today patient appears to be doing better currently in regard to her wounds in general. Everything is showing signs of improvement which is great news. Fortunately I see no evidence of active infection locally or systemically at this time which is great news. She does have a new skin tear on her forearm but this she did try to pull back over and has done quite well. 02-08-2022 upon evaluation today patient appears to be doing excellent currently in regard to her wound. She has been tolerating the dressing changes without complication and again she still seems to be making progress. Despite this she still has a lot of blisters that keep popping up again I have cultured them  and they have been cultured twice other once at dermatology once I believe with primary care never have we identified any causative organisms. I am beginning to believe in fact have made a referral to Dr. Posey Pronto, rheumatology, in order to see if there is some other type of inflammatory process they were dealing with here. I think that this is becoming increasingly likely. 02-21-2022 upon evaluation today patient appears to be doing well currently in regard to her wounds. In fact everything is showing signs of improvement which is good news she has found some compression that she was interested in that seems to have been doing extremely well at this point. I feel like that may be part of what is improving the here overall. 03-07-2022 upon evaluation today patient actually is making some good progress here in regard to her right leg I am very pleased in this regard. With that being said she does have a wound on her left leg which does appear to be a venous leg ulcer that occurred as a result of a blister that arose and then burst over the past week. Fortunately there does not appear to be any evidence of active infection at this time  which is great news. No fevers, chills, nausea, vomiting, or diarrhea. 03-21-2022 upon evaluation patient's wound is actually showing signs of significant improvement which is great news and overall I am extremely pleased with where we stand today. I do not see any evidence of infection locally or systemically which is great news. No fevers, chills, nausea, vomiting, or diarrhea. [name], [name] ([id number]) [id number] Page 7 of 9 04-04-2022 upon evaluation today patient appears to be doing better in regard to her right lateral ulcer. The anterior ulcer on the leg is also doing better. She still has several of these pustules that come and go but again they are not significant compared to last time I saw her even. I do believe the clobetasol that  was given by dermatology has been of benefit as well and this is great news. Objective Constitutional Well-nourished and well-hydrated in no acute distress. Vitals Time Taken: 9:10 AM, Height: 63 in, Weight: 147 lbs, BMI: 26, Temperature: 98.2 F, Pulse: 94 bpm, Respiratory Rate: 18 breaths/min, Blood Pressure: 169/88 mmHg. Respiratory normal breathing without difficulty. Psychiatric this patient is able to make decisions and demonstrates good insight into disease process. Alert and Oriented x 3. pleasant and cooperative. General Notes: Upon inspection patient's wound bed actually showed signs of good granulation and epithelization in fact she has a lot of feeling in the wound on the right lateral leg. She also did see Dr. Posey Pronto, rheumatology, they performed a lot of blood work to check and see if they can identify exactly what was going on from an inflammation standpoint here. Again we have not heard any results of that yet hopefully by next week we may have some additional results to know a little bit more about what is going on here. Integumentary (Hair, Skin) Wound #2 status is Open. Original cause of wound was Gradually Appeared. The date acquired was: 07/08/2021. The wound has been in treatment 17 weeks. The wound is located on the Right,Lateral Lower Leg. The wound measures 5.1cm length x 0.9cm width x 0.2cm depth; 3.605cm^2 area and 0.721cm^3 volume. There is Fat Layer (Subcutaneous Tissue) exposed. There is a medium amount of serosanguineous drainage noted. There is large (67-100%) red granulation within the wound bed. There is a small (1-33%) amount of necrotic tissue within the wound bed including Adherent Slough. Wound #6 status is Healed - Epithelialized. Original cause of wound was Shear/Friction. The date acquired was: 03/01/2022. The wound has been in treatment 4 weeks. The wound is located on the Left,Anterior Lower Leg. The wound measures 0cm length x 0cm width x 0cm depth; 0cm^2  area and 0cm^3 volume. There is no tunneling or undermining noted. There is a none present amount of drainage noted. The wound margin is flat and intact. There is no granulation within the wound bed. There is no necrotic tissue within the wound bed. Assessment Active Problems ICD-10 Chronic venous hypertension (idiopathic) with ulcer and inflammation of bilateral lower extremity Non-pressure chronic ulcer of other part of right lower leg with fat layer exposed Cellulitis of right lower limb Laceration without foreign body of left forearm, initial encounter Non-pressure chronic ulcer of other part of left lower leg with fat layer exposed Other specified peripheral vascular diseases Acquired absence of other right toe(s) Procedures Wound #2 Pre-procedure diagnosis of Wound #2 is a Venous Leg Ulcer located on the Right,Lateral Lower Leg .Severity of Tissue Pre Debridement is: Fat layer exposed. There was a Excisional Skin/Subcutaneous Tissue Debridement with a total area of 4 sq  cm performed by Tommie Sams., PA-C. With the following instrument(s): Curette to remove Viable and Non-Viable tissue/material. Material removed includes Subcutaneous Tissue and Slough and. A time out was conducted at 09:28, prior to the start of the procedure. A Minimum amount of bleeding was controlled with Pressure. The procedure was tolerated well. Post Debridement Measurements: 5.1cm length x 0.9cm width x 0.2cm depth; 0.721cm^3 volume. Character of Wound/Ulcer Post Debridement is stable. Severity of Tissue Post Debridement is: Fat layer exposed. Post procedure Diagnosis Wound #2: Same as Pre-Procedure Plan Follow-up Appointments: Return Appointment in 1 week. Bathing/ Shower/ Hygiene: May shower; gently cleanse wound with antibacterial soap, rinse and pat dry prior to dressing wounds - Hibiclens recommended for washing right lower leg prior [name], [name] ([id number]) [id number]  Page 8 of 9 to dressing changes. You may pick this up at a pharmacy or purchase on [employer] No tub bath. Edema Control - Lymphedema / Segmental Compressive Device / Other: Tubigrip double layer applied - Tubi D double layer Elevate, Exercise Daily and Avoid Standing for Long Periods of Time. Elevate legs to the level of the heart and pump ankles as often as possible Elevate leg(s) parallel to the floor when sitting. WOUND #2: - Lower Leg Wound Laterality: Right, Lateral Cleanser: Hibiclens, 16 (oz) 3 x Per Week/30 Days Discharge Instructions: Put Hibiclens on your skin and rub it in gently for five minutes with a washcloth. Turn the water back on and rinse very well with warm water. Do not use your regular soap after using and rinsing Hibiclens. Pat yourself dry with a clean towel. Topical: Clobetasol Propionate ointment 0.05%, 60 (g) tube 3 x Per Week/30 Days Discharge Instructions: Apply as directed. Given by Dermatologist Prim Dressing: Silvercel 4 1/4x 4 1/4 (in/in) 3 x Per Week/30 Days ary Discharge Instructions: Apply Silvercel 4 1/4x 4 1/4 (in/in) as instructed Secondary Dressing: ABD Pad 5x9 (in/in) 3 x Per Week/30 Days Discharge Instructions: Cover with ABD pad Secured With: Medipore T - 48M Medipore H Soft Cloth Surgical T ape ape, 2x2 (in/yd) 3 x Per Week/30 Days Secured With: Conform 4'' - Conforming Stretch Gauze Bandage 4x75 (in/in) 3 x Per Week/30 Days Discharge Instructions: Apply as directed Secured With: Tubigrip Size D, 3x10 (in/yd) 3 x Per Week/30 Days 1. I am going to suggest that we have the patient continue with the clobetasol to the open areas. I do believe that she is doing quite well in that regard. 2. Also can recommend that we use silver alginate to the open wound area that seems to be doing great I am actually extremely pleased with where we stand and I think she is making good progress here. There is a lot of epithelization even compared to last week. 3. With  regard to the possibility of a issue more so with her inflammatory condition were actually going to hold and see what Dr. Posey Pronto has to say from the blood work that was done at rheumatology and then we will proceed from there this is again been something that is kind of been an ongoing issue for her for some time and again we have ruled out infection as a possible cause. We will see patient back for reevaluation in 1 week here in the clinic. If anything worsens or changes patient will contact our office for additional recommendations. Electronic Signature(s) Signed: 04/04/2022 10:44:40 AM By: Worthy Keeler PA-C Entered By: Worthy Keeler on 04/04/2022 10:44:40 -------------------------------------------------------------------------------- SuperBill Details Patient Name: Date of Service: [name],  Brandy [name]. 04/04/2022 Medical Record Number: [id number] Patient Account Number: [id number] Date of Birth/Sex: Treating RN: 13-Dec-1950 (71 y.o. [name], [name] Primary Care Provider: [name] Other Clinician: Massie Kluver Referring Provider: Treating Provider/Extender: [name] Weeks in Treatment: 17 Diagnosis Coding ICD-10 Codes Code Description [phone] Chronic venous hypertension (idiopathic) with ulcer and inflammation of bilateral lower extremity L97.812 Non-pressure chronic ulcer of other part of right lower leg with fat layer exposed L03.115 Cellulitis of right lower limb S51.812A Laceration without foreign body of left forearm, initial encounter L97.822 Non-pressure chronic ulcer of other part of left lower leg with fat layer exposed I73.89 Other specified peripheral vascular diseases Z89.421 Acquired absence of other right toe(s) Facility Procedures : [name], [name] Code: [name] [name] ([id number] ICD- Description: 11042 - DEB SUBQ TISSUE 20 SQ CM/< 0) [name] 10 Diagnosis Description L97.812 Non-pressure chronic ulcer of other part of right lower leg with  fat layer exposed Modifier: [id number] Pa Quantity: 1 ge 9 of 9 Physician Procedures : CPT4 Code Description Modifier [name] 86825 - WC PHYS SUBQ TISS 20 SQ CM ICD-10 Diagnosis Description R49.355 Non-pressure chronic ulcer of other part of right lower leg with fat layer exposed Quantity: 1 Electronic Signature(s) Signed: 04/04/2022 10:45:07 AM By: Worthy Keeler PA-C Entered By: Worthy Keeler on 04/04/2022 10:45:07

## 2022-04-05 NOTE — Progress Notes (Signed)
DEMIAH, GULLICKSON ([id number]) [id number] Page 1 of 10 Visit Report for 04/04/2022 Arrival Information Details Patient Name: Date of Service: [name], [name] Brandy K. 04/04/2022 9:00 A M Medical Record Number: [id number] Patient Account Number: [id number] Date of Birth/Sex: Treating RN: [birth year] (71 y.o. [name] Primary Care [name]: [name] Other Clinician: [name] Referring [name]: Treating [name]/Extender: [name] Weeks in Treatment: 4 Visit Information History Since Last Visit All ordered tests and consults were completed: No Patient Arrived: Ambulatory Added or deleted any medications: No Arrival Time: 09:08 Any new allergies or adverse reactions: No Transfer Assistance: None Had a fall or experienced change in No Patient Requires Transmission-Based Precautions: No activities of daily living that may affect Patient Has Alerts: Yes risk of falls: Patient Alerts: Patient on Blood Thinner Hospitalized since last visit: No Pain Present Now: No Electronic Signature(s) Signed: 04/04/2022 5:32:10 PM By: [name] Entered By: [name] on 04/04/2022 09:09:36 -------------------------------------------------------------------------------- Clinic Level of Care Assessment Details Patient Name: Date of Service: [name], SOUCEK. 04/04/2022 9:00 A M Medical Record Number: [id number] Patient Account Number: [id number] Date of Birth/Sex: Treating RN: 29-Jan-1951 (71 y.o. [name] Primary Care [name]: [name] Other Clinician: [name] Referring [name]: Treating [name]/Extender: [name] Weeks in Treatment: 17 Clinic Level of Care Assessment Items TOOL 1 Quantity Score '[]'$  - 0 Use when EandM and Procedure is performed on INITIAL visit ASSESSMENTS - Nursing Assessment / Reassessment '[]'$  - 0 General Physical Exam (combine w/ comprehensive assessment (listed just below) when  performed on new pt. evals) '[]'$  - 0 Comprehensive Assessment (HX, ROS, Risk Assessments, Wounds Hx, etc.) ASSESSMENTS - Wound and Skin Assessment / Reassessment '[]'$  - 0 Dermatologic / Skin Assessment (not related to wound area) ASSESSMENTS - Ostomy and/or Continence Assessment and Care [name], [name] ([id number]) [id number] Page 2 of 10 '[]'$  - 0 Incontinence Assessment and Management '[]'$  - 0 Ostomy Care Assessment and Management (repouching, etc.) PROCESS - Coordination of Care '[]'$  - 0 Simple Patient / Family Education for ongoing care '[]'$  - 0 Complex (extensive) Patient / Family Education for ongoing care '[]'$  - 0 Staff obtains Programmer, systems, Records, T Results / Process Orders est '[]'$  - 0 Staff telephones HHA, Nursing Homes / Clarify orders / etc '[]'$  - 0 Routine Transfer to another Facility (non-emergent condition) '[]'$  - 0 Routine Hospital Admission (non-emergent condition) '[]'$  - 0 New Admissions / Biomedical engineer / Ordering NPWT Apligraf, etc. , '[]'$  - 0 Emergency Hospital Admission (emergent condition) PROCESS - Special Needs '[]'$  - 0 Pediatric / Minor Patient Management '[]'$  - 0 Isolation Patient Management '[]'$  - 0 Hearing / Language / Visual special needs '[]'$  - 0 Assessment of Community assistance (transportation, D/C planning, etc.) '[]'$  - 0 Additional assistance / Altered mentation '[]'$  - 0 Support Surface(s) Assessment (bed, cushion, seat, etc.) INTERVENTIONS - Miscellaneous '[]'$  - 0 External ear exam '[]'$  - 0 Patient Transfer (multiple staff / Civil Service fast streamer / Similar devices) '[]'$  - 0 Simple Staple / Suture removal (25 or less) '[]'$  - 0 Complex Staple / Suture removal (26 or more) '[]'$  - 0 Hypo/Hyperglycemic Management (do not check if billed separately) '[]'$  - 0 Ankle / Brachial Index (ABI) - do not check if billed separately Has the patient been seen at the hospital within the last three years: Yes Total Score: 0 Level Of Care: ____ Electronic  Signature(s) Signed: 04/04/2022 5:32:10 PM By: [name] Entered By: [name] on 04/04/2022 09:36:18 -------------------------------------------------------------------------------- Encounter Discharge  Information Details Patient Name: Date of Service: [name], MOYLAN. 04/04/2022 9:00 A M Medical Record Number: [id number] Patient Account Number: [id number] Date of Birth/Sex: Treating RN: [birth year] (71 y.o. [name] Primary Care [name]: [name] Other Clinician: [name] Referring Dekayla Prestridge: Treating [name]: [name] Weeks in Treatment: 71 Encounter Discharge Information Items Post Procedure Vitals Discharge Condition: Stable Temperature (F): 98.2 [name], [name] K ([id number]) [id number] Page 3 of 10 Ambulatory Status: Ambulatory Pulse (bpm): 94 Discharge Destination: Home Respiratory Rate (breaths/min): 18 Transportation: Private Auto Blood Pressure (mmHg): 169/88 Accompanied By: self Schedule Follow-up Appointment: Yes Clinical Summary of Care: Electronic Signature(s) Signed: 04/04/2022 5:32:10 PM By: [name] Entered By: [name] on 04/04/2022 09:46:17 -------------------------------------------------------------------------------- Lower Extremity Assessment Details Patient Name: Date of Service: [name], Brandy Brandy K. 04/04/2022 9:00 A M Medical Record Number: [id number] Patient Account Number: [id number] Date of Birth/Sex: Treating RN: [birth year] (71 y.o. [name] Primary Care [name]: [name] Other Clinician: [name] Referring Neveah Bang: Treating [name]/Extender: [name] Weeks in Treatment: 17 Edema Assessment Assessed: [Left: No] [Right: Yes] Edema: [Left: [name]] [Right: s] Calf Left: Right: Point of Measurement: 32 cm From Medial Instep 31.4 cm Ankle Left: Right: Point of Measurement: 10 cm From Medial Instep 20.2 cm Vascular  Assessment Pulses: Dorsalis Pedis Palpable: [Right:Yes] Posterior Tibial Palpable: [Right:Yes] Electronic Signature(s) Signed: 04/04/2022 5:32:10 PM By: [name] Signed: 04/05/2022 12:02:04 PM By: Gretta Cool, BSN, RN, CWS, [name] RN, BSN Entered By: [name] on 04/04/2022 08:65:78 [name] ([id number]) [id number] Page 4 of 10 -------------------------------------------------------------------------------- Multi Wound Chart Details Patient Name: Date of Service: ISA, HITZ. 04/04/2022 9:00 A M Medical Record Number: [id number] Patient Account Number: [id number] Date of Birth/Sex: Treating RN: [birth year] (71 y.o. [name], [name] Primary Care Daeron Carreno: [name] Other Clinician: [name] Referring Tammye Kahler: Treating Hristopher Missildine/Extender: [name] Weeks in Treatment: 17 Vital Signs Height(in): 63 Pulse(bpm): 94 Weight(lbs): 147 Blood Pressure(mmHg): 169/88 Body Mass Index(BMI): 26 Temperature(F): 98.2 Respiratory Rate(breaths/min): 18 [2:Photos:] [N/A:N/A] Right, Lateral Lower Leg Left, Anterior Lower Leg N/A Wound Location: Gradually Appeared Shear/Friction N/A Wounding Event: Venous Leg Ulcer Trauma, Other N/A Primary Etiology: Hypertension, Peripheral Arterial Hypertension, Peripheral Arterial N/A Comorbid History: Disease Disease 07/08/2021 03/01/2022 N/A Date Acquired: 17 4 N/A Weeks of Treatment: Open Open N/A Wound Status: No No N/A Wound Recurrence: 5.1x0.9x0.2 0.1x0.1x0.1 N/A Measurements L x W x D (cm) 3.605 0.008 N/A A (cm) : rea 0.721 0.001 N/A Volume (cm) : 59.00% 99.70% N/A % Reduction in Area: 79.50% 99.60% N/A % Reduction in Volume: Full Thickness Without Exposed Partial Thickness N/A Classification: Support Structures Medium Medium N/A Exudate Amount: Serosanguineous Serosanguineous N/A Exudate Type: red, brown red, brown N/A Exudate Color: N/A Flat and Intact N/A Wound  Margin: Large (67-100%) None Present (0%) N/A Granulation Amount: Red N/A N/A Granulation Quality: Small (1-33%) Small (1-33%) N/A Necrotic Amount: Fat Layer (Subcutaneous Tissue): Yes Fat Layer (Subcutaneous Tissue): Yes N/A Exposed Structures: Fascia: No Fascia: No Tendon: No Tendon: No Muscle: No Muscle: No Joint: No Joint: No Bone: No Bone: No None None N/A Epithelialization: Treatment Notes Electronic Signature(s) Signed: 04/04/2022 5:32:10 PM By: [name] Entered By: [name] on 04/04/2022 09:21:59 Esquibel, [name] ([id number]) [id number] Page 5 of 10 -------------------------------------------------------------------------------- Multi-Disciplinary Care Plan Details Patient Name: Date of Service: [name], LYTTLE. 04/04/2022 9:00 A M Medical Record Number: [id number] Patient Account Number: [id number] Date of Birth/Sex: Treating RN: 1950-07-20 (71 y.o. [name] Primary  Care Dareth Andrew: [name] Other Clinician: [name] Referring Sweetie Giebler: Treating Danaija Eskridge/Extender: [name] Weeks in Treatment: 30 Active Inactive Wound/Skin Impairment Nursing Diagnoses: Knowledge deficit related to ulceration/compromised skin integrity Goals: Patient/caregiver will verbalize understanding of skin care regimen Date Initiated: 11/30/2021 Target Resolution Date: 12/30/2021 Goal Status: Active Ulcer/skin breakdown will have a volume reduction of 30% by week 4 Date Initiated: 11/30/2021 Target Resolution Date: 01/30/2022 Goal Status: Active Ulcer/skin breakdown will have a volume reduction of 50% by week 8 Date Initiated: 11/30/2021 Target Resolution Date: 03/02/2022 Goal Status: Active Ulcer/skin breakdown will have a volume reduction of 80% by week 12 Date Initiated: 11/30/2021 Target Resolution Date: 04/01/2022 Goal Status: Active Ulcer/skin breakdown will heal within 14 weeks Date Initiated: 11/30/2021 Target  Resolution Date: 05/02/2022 Goal Status: Active Interventions: Assess patient/caregiver ability to obtain necessary supplies Assess patient/caregiver ability to perform ulcer/skin care regimen upon admission and as needed Assess ulceration(s) every visit Notes: Electronic Signature(s) Signed: 04/04/2022 5:32:10 PM By: [name] Signed: 04/05/2022 12:02:04 PM By: Gretta Cool, BSN, RN, CWS, [name] RN, BSN Entered By: [name] on 04/04/2022 09:21:28 Pain Assessment Details -------------------------------------------------------------------------------- [name] ([id number]) [id number] Page 6 of 10 Patient Name: Date of Service: [name], FLEEK. 04/04/2022 9:00 A M Medical Record Number: [id number] Patient Account Number: [id number] Date of Birth/Sex: Treating RN: October 01, 1950 (71 y.o. [name] Primary Care Acacia Latorre: [name] Other Clinician: [name] Referring Malinda Mayden: Treating Hulen Mandler/Extender: [name] Weeks in Treatment: 17 Active Problems Location of Pain Severity and Description of Pain Patient Has Paino No Site Locations Pain Management and Medication Current Pain Management: Electronic Signature(s) Signed: 04/04/2022 5:32:10 PM By: [name] Signed: 04/05/2022 12:02:04 PM By: Gretta Cool, BSN, RN, CWS, [name] RN, BSN Entered By: [name] on 04/04/2022 09:12:15 -------------------------------------------------------------------------------- Patient/Caregiver Education Details Patient Name: Date of Service: Brandy Shiver Brandy K. 10/30/2023andnbsp9:00 [place] Record Number: [id number] Patient Account Number: [id number] Date of Birth/Gender: Treating RN: June 11, 1950 (71 y.o. [name] Primary Care Physician: [name] Other Clinician: [name] Referring Physician: Treating Physician/Extender: [name] Weeks in Treatment: 30 Education Assessment Education Provided  To: Patient Education Topics Provided Wound/Skin Impairment: Handouts: Other: continue wound care as directed Methods: Explain/Verbal Responses: State content correctly KASIYA, BURCK ([id number]) [id number] Page 7 of 10 Electronic Signature(s) Signed: 04/04/2022 5:32:10 PM By: [name] Entered By: [name] on 04/04/2022 09:36:50 -------------------------------------------------------------------------------- Wound Assessment Details Patient Name: Date of Service: [name], Brandy Brandy K. 04/04/2022 9:00 A M Medical Record Number: [id number] Patient Account Number: [id number] Date of Birth/Sex: Treating RN: 11/27/1950 (71 y.o. [name], [name] Primary Care Tylin Force: [name] Other Clinician: [name] Referring Roger Fasnacht: Treating Davine Sweney/Extender: [name] Weeks in Treatment: 17 Wound Status Wound Number: 2 Primary Etiology: Venous Leg Ulcer Wound Location: Right, Lateral Lower Leg Wound Status: Open Wounding Event: Gradually Appeared Comorbid History: Hypertension, Peripheral Arterial Disease Date Acquired: 07/08/2021 Weeks Of Treatment: 17 Clustered Wound: No Photos Wound Measurements Length: (cm) 5.1 Width: (cm) 0.9 Depth: (cm) 0.2 Area: (cm) 3.605 Volume: (cm) 0.721 % Reduction in Area: 59% % Reduction in Volume: 79.5% Epithelialization: None Wound Description Classification: Full Thickness Without Exposed Support Structures Exudate Amount: Medium Exudate Type: Serosanguineous Exudate Color: red, brown Foul Odor After Cleansing: No Slough/Fibrino Yes Wound Bed Granulation Amount: Large (67-100%) Exposed Structure Granulation Quality: Red Fascia Exposed: No Necrotic Amount: Small (1-33%) Fat Layer (Subcutaneous Tissue) Exposed: Yes Necrotic Quality: Adherent Slough Tendon Exposed: No Muscle Exposed: No Joint  Exposed: No Bone Exposed: No [name], YEPEZ ([id number])  [id number] Page 8 of 10 Treatment Notes Wound #2 (Lower Leg) Wound Laterality: Right, Lateral Cleanser Hibiclens, 16 (oz) Discharge Instruction: Put Hibiclens on your skin and rub it in gently for five minutes with a washcloth. Turn the water back on and rinse very well with warm water. Do not use your regular soap after using and rinsing Hibiclens. Pat yourself dry with a clean towel. Peri-Wound Care Topical Clobetasol Propionate ointment 0.05%, 60 (g) tube Discharge Instruction: Apply as directed. Given by Dermatologist Primary Dressing Silvercel 4 1/4x 4 1/4 (in/in) Discharge Instruction: Apply Silvercel 4 1/4x 4 1/4 (in/in) as instructed Secondary Dressing ABD Pad 5x9 (in/in) Discharge Instruction: Cover with ABD pad Secured With [employer] H Soft Cloth Surgical T ape ape, 2x2 (in/yd) Conform 4'' - Conforming Stretch Gauze Bandage 4x75 (in/in) Discharge Instruction: Apply as directed Tubigrip Size D, 3x10 (in/yd) Compression Wrap Compression Stockings Add-Ons Electronic Signature(s) Signed: 04/04/2022 5:32:10 PM By: [name] Signed: 04/05/2022 12:02:04 PM By: Gretta Cool, BSN, RN, CWS, [name] RN, BSN Entered By: [name] on 04/04/2022 09:19:39 -------------------------------------------------------------------------------- Wound Assessment Details Patient Name: Date of Service: Brandy Shiver Brandy K. 04/04/2022 9:00 A M Medical Record Number: [id number] Patient Account Number: [id number] Date of Birth/Sex: Treating RN: Nov 28, 1950 (71 y.o. [name] Primary Care Dia Jefferys: [name] Other Clinician: [name] Referring Libbey Duce: Treating Azalynn Maxim/Extender: [name] Weeks in Treatment: 17 Wound Status Wound Number: 6 Primary Etiology: Trauma, Other Wound Location: Left, Anterior Lower Leg Wound Status: Healed - Epithelialized Wounding Event: Shear/Friction Comorbid History: Hypertension, Peripheral  Arterial Disease Date Acquired: 03/01/2022 Weeks Of Treatment: 4 Clustered Wound: No Photos ANDA, SOBOTTA ([id number]) [id number] Page 9 of 10 Wound Measurements Length: (cm) Width: (cm) Depth: (cm) Area: (cm) Volume: (cm) 0 % Reduction in Area: 100% 0 % Reduction in Volume: 100% 0 Epithelialization: Large (67-100%) 0 Tunneling: No 0 Undermining: No Wound Description Classification: Partial Thickness Wound Margin: Flat and Intact Exudate Amount: None Present Foul Odor After Cleansing: No Slough/Fibrino Yes Wound Bed Granulation Amount: None Present (0%) Exposed Structure Necrotic Amount: None Present (0%) Fascia Exposed: No Fat Layer (Subcutaneous Tissue) Exposed: No Tendon Exposed: No Muscle Exposed: No Joint Exposed: No Bone Exposed: No Electronic Signature(s) Signed: 04/04/2022 5:32:10 PM By: [name] Signed: 04/05/2022 12:02:04 PM By: Gretta Cool, BSN, RN, CWS, [name] RN, BSN Entered By: [name] on 04/04/2022 09:34:47 -------------------------------------------------------------------------------- [place] Details Patient Name: Date of Service: Brandy Shiver Brandy K. 04/04/2022 9:00 A M Medical Record Number: [id number] Patient Account Number: [id number] Date of Birth/Sex: Treating RN: June 24, 1950 (71 y.o. [name], [name] Primary Care Dlisa Barnwell: [name] Other Clinician: [name] Referring Idalys Konecny: Treating Jayd Forrey/Extender: [name] Weeks in Treatment: 17 Vital Signs Time Taken: 09:10 Temperature (F): 98.2 Height (in): 63 Pulse (bpm): 94 Weight (lbs): 147 Respiratory Rate (breaths/min): 18 Body Mass Index (BMI): 26 Blood Pressure (mmHg): 169/88 Reference Range: 80 - 120 mg / dl Electronic Signature(s) Signed: 04/04/2022 5:32:10 PM By: [name] Entered By: [name] on 04/04/2022 09:12:10 Skelley, [name] ([id number]) [id number] Page 10 of 10

## 2022-04-11 ENCOUNTER — Encounter: Payer: Medicare HMO | Attending: Physician Assistant | Admitting: Internal Medicine

## 2022-04-11 DIAGNOSIS — I10 Essential (primary) hypertension: Secondary | ICD-10-CM | POA: Insufficient documentation

## 2022-04-11 DIAGNOSIS — S51812A Laceration without foreign body of left forearm, initial encounter: Secondary | ICD-10-CM | POA: Insufficient documentation

## 2022-04-11 DIAGNOSIS — L97822 Non-pressure chronic ulcer of other part of left lower leg with fat layer exposed: Secondary | ICD-10-CM | POA: Diagnosis not present

## 2022-04-11 DIAGNOSIS — L03115 Cellulitis of right lower limb: Secondary | ICD-10-CM | POA: Diagnosis not present

## 2022-04-11 DIAGNOSIS — I7389 Other specified peripheral vascular diseases: Secondary | ICD-10-CM | POA: Diagnosis not present

## 2022-04-11 DIAGNOSIS — Z89421 Acquired absence of other right toe(s): Secondary | ICD-10-CM | POA: Diagnosis not present

## 2022-04-11 DIAGNOSIS — I87333 Chronic venous hypertension (idiopathic) with ulcer and inflammation of bilateral lower extremity: Secondary | ICD-10-CM | POA: Diagnosis present

## 2022-04-11 DIAGNOSIS — L97812 Non-pressure chronic ulcer of other part of right lower leg with fat layer exposed: Secondary | ICD-10-CM | POA: Insufficient documentation

## 2022-04-13 NOTE — Progress Notes (Signed)
RITHIKA, SEEL ([id number]) [id number] Page 1 of 9 Visit Report for 04/11/2022 Arrival Information Details Patient Name: Date of Service: [name], [name]. 04/11/2022 12:45 PM Medical Record Number: [id number] Patient Account Number: [id number] Date of Birth/Sex: Treating RN: 10-08-1950 (71 y.o. [name] Primary Care Jestine Bicknell: [name] Other Clinician: Massie Kluver Referring Wladyslaw Henrichs: Treating Shantavia Jha/Extender: RO BSO N, MICHA EL G LEE, KEUNG Weeks in Treatment: 18 Visit Information History Since Last Visit All ordered tests and consults were completed: No Patient Arrived: Ambulatory Added or deleted any medications: No Arrival Time: 12:52 Any new allergies or adverse reactions: No Transfer Assistance: None Had a fall or experienced change in No Patient Requires Transmission-Based Precautions: No activities of daily living that may affect Patient Has Alerts: Yes risk of falls: Patient Alerts: Patient on Blood Thinner Hospitalized since last visit: No Pain Present Now: Yes Electronic Signature(s) Signed: 04/12/2022 5:15:48 PM By: Massie Kluver Entered By: Massie Kluver on 04/11/2022 12:53:22 -------------------------------------------------------------------------------- Clinic Level of Care Assessment Details Patient Name: Date of Service: [name], [name] 04/11/2022 12:45 PM Medical Record Number: [id number] Patient Account Number: [id number] Date of Birth/Sex: Treating RN: 1950/10/18 (71 y.o. [name] Primary Care Montzerrat Brunell: [name] Other Clinician: Massie Kluver Referring Abbegail Matuska: Treating Lukus Binion/Extender: RO BSO N, MICHA EL G LEE, KEUNG Weeks in Treatment: 18 Clinic Level of Care Assessment Items TOOL 1 Quantity Score '[]'$  - 0 Use when EandM and Procedure is performed on INITIAL visit ASSESSMENTS - Nursing Assessment / Reassessment '[]'$  - 0 General Physical Exam (combine w/ comprehensive assessment (listed just below)  when performed on new pt. evals) '[]'$  - 0 Comprehensive Assessment (HX, ROS, Risk Assessments, Wounds Hx, etc.) ASSESSMENTS - Wound and Skin Assessment / Reassessment '[]'$  - 0 Dermatologic / Skin Assessment (not related to wound area) ASSESSMENTS - Ostomy and/or Continence Assessment and [place], [place] ([id number]) [id number] Page 2 of 9 '[]'$  - 0 Incontinence Assessment and Management '[]'$  - 0 Ostomy Care Assessment and Management (repouching, etc.) PROCESS - Coordination of Care '[]'$  - 0 Simple Patient / Family Education for ongoing care '[]'$  - 0 Complex (extensive) Patient / Family Education for ongoing care '[]'$  - 0 Staff obtains Programmer, systems, Records, T Results / Process Orders est '[]'$  - 0 Staff telephones HHA, Nursing Homes / Clarify orders / etc '[]'$  - 0 Routine Transfer to another Facility (non-emergent condition) '[]'$  - 0 Routine Hospital Admission (non-emergent condition) '[]'$  - 0 New Admissions / Biomedical engineer / Ordering NPWT Apligraf, etc. , '[]'$  - 0 Emergency Hospital Admission (emergent condition) PROCESS - Special Needs '[]'$  - 0 Pediatric / Minor Patient Management '[]'$  - 0 Isolation Patient Management '[]'$  - 0 Hearing / Language / Visual special needs '[]'$  - 0 Assessment of Community assistance (transportation, D/C planning, etc.) '[]'$  - 0 Additional assistance / Altered mentation '[]'$  - 0 Support Surface(s) Assessment (bed, cushion, seat, etc.) INTERVENTIONS - Miscellaneous '[]'$  - 0 External ear exam '[]'$  - 0 Patient Transfer (multiple staff / Civil Service fast streamer / Similar devices) '[]'$  - 0 Simple Staple / Suture removal (25 or less) '[]'$  - 0 Complex Staple / Suture removal (26 or more) '[]'$  - 0 Hypo/Hyperglycemic Management (do not check if billed separately) '[]'$  - 0 Ankle / Brachial Index (ABI) - do not check if billed separately Has the patient been seen at the hospital within the last three years: Yes Total Score: 0 Level Of Care: ____ Electronic  Signature(s) Signed: 04/12/2022 5:15:48 PM By: Massie Kluver Entered By: Massie Kluver  on 04/11/2022 13:13:36 -------------------------------------------------------------------------------- Encounter Discharge Information Details Patient Name: Date of Service: [name], [name]. 04/11/2022 12:45 PM Medical Record Number: [id number] Patient Account Number: [id number] Date of Birth/Sex: Treating RN: [birth year] (71 y.o. [name] Primary Care Netha Dafoe: [name] Other Clinician: Massie Kluver Referring Lidya Mccalister: Treating Aquarius Latouche/Extender: RO BSO N, MICHA EL G LEE, KEUNG Weeks in Treatment: 18 Encounter Discharge Information Items Post Procedure Vitals Discharge Condition: Stable Temperature (F): 98.4 [name], [name] ([id number]) [id number] Page 3 of 9 Ambulatory Status: Ambulatory Pulse (bpm): 106 Discharge Destination: Home Respiratory Rate (breaths/min): 18 Transportation: Private Auto Blood Pressure (mmHg): 130/83 Accompanied By: self Schedule Follow-up Appointment: Yes Clinical Summary of Care: Electronic Signature(s) Signed: 04/12/2022 5:15:48 PM By: Massie Kluver Entered By: Massie Kluver on 04/11/2022 13:24:23 -------------------------------------------------------------------------------- Lower Extremity Assessment Details Patient Name: Date of Service: [name], Brandy [name]. 04/11/2022 12:45 PM Medical Record Number: [id number] Patient Account Number: [id number] Date of Birth/Sex: Treating RN: 19-May-1951 (71 y.o. [name] Primary Care Gianny Killman: [name] Other Clinician: Massie Kluver Referring Laural Eiland: Treating Allyah Heather/Extender: RO BSO N, MICHA EL G LEE, KEUNG Weeks in Treatment: 18 Edema Assessment Assessed: [Left: No] [Right: Yes] Edema: [Left: Ye] [Right: s] Calf Left: Right: Point of Measurement: 32 cm From Medial Instep 30.4 cm Ankle Left: Right: Point of Measurement: 10 cm From Medial Instep 21 cm Vascular  Assessment Pulses: Dorsalis Pedis Palpable: [Right:Yes] Electronic Signature(s) Signed: 04/12/2022 8:23:09 AM By: Gretta Cool, BSN, RN, CWS, [name] RN, BSN Signed: 04/12/2022 5:15:48 PM By: Massie Kluver Entered By: Massie Kluver on 04/11/2022 13:07:50 [place], [name] ([id number]) [id number] Page 4 of 9 -------------------------------------------------------------------------------- Multi Wound Chart Details Patient Name: Date of Service: [name], RIGA. 04/11/2022 12:45 PM Medical Record Number: [id number] Patient Account Number: [id number] Date of Birth/Sex: Treating RN: March 27, 1951 (71 y.o. [name] Primary Care Naidelyn Parrella: [name] Other Clinician: Massie Kluver Referring Ziaire Bieser: Treating Dyane Broberg/Extender: RO BSO N, MICHA EL G LEE, KEUNG Weeks in Treatment: 18 Vital Signs Height(in): 63 Pulse(bpm): 106 Weight(lbs): 147 Blood Pressure(mmHg): 130/83 Body Mass Index(BMI): 26 Temperature(F): 98.4 Respiratory Rate(breaths/min): 18 [2:Photos:] [N/A:N/A] Right, Lateral Lower Leg N/A N/A Wound Location: Gradually Appeared N/A N/A Wounding Event: Venous Leg Ulcer N/A N/A Primary Etiology: Hypertension, Peripheral Arterial N/A N/A Comorbid History: Disease 07/08/2021 N/A N/A Date Acquired: 60 N/A N/A Weeks of Treatment: Open N/A N/A Wound Status: No N/A N/A Wound Recurrence: 5x1x0.2 N/A N/A Measurements L x W x D (cm) 3.927 N/A N/A A (cm) : rea 0.785 N/A N/A Volume (cm) : 55.40% N/A N/A % Reduction in Area: 77.70% N/A N/A % Reduction in Volume: Full Thickness Without Exposed N/A N/A Classification: Support Structures Medium N/A N/A Exudate Amount: Serosanguineous N/A N/A Exudate Type: red, brown N/A N/A Exudate Color: Large (67-100%) N/A N/A Granulation Amount: Red N/A N/A Granulation Quality: Small (1-33%) N/A N/A Necrotic Amount: Fat Layer (Subcutaneous Tissue): Yes N/A N/A Exposed Structures: Fascia: No Tendon:  No Muscle: No Joint: No Bone: No None N/A N/A Epithelialization: Treatment Notes Electronic Signature(s) Signed: 04/12/2022 5:15:48 PM By: Massie Kluver Entered By: Massie Kluver on 04/11/2022 13:08:05 [place], [name] ([id number]) [id number] Page 5 of 9 -------------------------------------------------------------------------------- Multi-Disciplinary Care Plan Details Patient Name: Date of Service: [name], RATHER. 04/11/2022 12:45 PM Medical Record Number: [id number] Patient Account Number: [id number] Date of Birth/Sex: Treating RN: [birth year] (71 y.o. [name] Primary Care Glendon Fiser: [name] Other Clinician: Massie Kluver Referring Kymberley Raz: Treating Moe Brier/Extender: RO BSO N, MICHA EL G LEE, KEUNG Weeks in Treatment:  18 Active Inactive Wound/Skin Impairment Nursing Diagnoses: Knowledge deficit related to ulceration/compromised skin integrity Goals: Patient/caregiver will verbalize understanding of skin care regimen Date Initiated: 11/30/2021 Target Resolution Date: 12/30/2021 Goal Status: Active Ulcer/skin breakdown will have a volume reduction of 30% by week 4 Date Initiated: 11/30/2021 Target Resolution Date: 01/30/2022 Goal Status: Active Ulcer/skin breakdown will have a volume reduction of 50% by week 8 Date Initiated: 11/30/2021 Target Resolution Date: 03/02/2022 Goal Status: Active Ulcer/skin breakdown will have a volume reduction of 80% by week 12 Date Initiated: 11/30/2021 Target Resolution Date: 04/01/2022 Goal Status: Active Ulcer/skin breakdown will heal within 14 weeks Date Initiated: 11/30/2021 Target Resolution Date: 05/02/2022 Goal Status: Active Interventions: Assess patient/caregiver ability to obtain necessary supplies Assess patient/caregiver ability to perform ulcer/skin care regimen upon admission and as needed Assess ulceration(s) every visit Notes: Electronic Signature(s) Signed: 04/12/2022 8:23:09 AM By:  Gretta Cool, BSN, RN, CWS, [name] RN, BSN Signed: 04/12/2022 5:15:48 PM By: Massie Kluver Entered By: Massie Kluver on 04/11/2022 13:07:59 -------------------------------------------------------------------------------- Pain Assessment Details Patient Name: Date of Service: Brandy [place] Mccarthy. 04/11/2022 12:45 PM Medical Record Number: [id number] Patient Account Number: [id number] Date of Birth/Sex: Treating RN: [birth year] (71 y.o. [name] Primary Care Medardo Hassing: [name] Other Clinician: Massie Kluver Referring Calani Gick: Treating Kandee Escalante/Extender: RO BSO N, MICHA EL G LEE, KEUNG Weeks in Treatment: 18 Active Problems Location of Pain Severity and Description of Pain Patient Has Paino Yes Site Locations Pain LocationANGELIN, CUTRONE ([id number]) [id number] Page 6 of 9 Pain Location: Generalized Pain, Pain in Ulcers With Dressing Change: No Duration of the Pain. Constant / Intermittento Intermittent Rate the pain. Current Pain Level: 7 Worst Pain Level: 10 Least Pain Level: 1 Tolerable Pain Level: 4 Character of Pain Describe the Pain: Burning, Other: stinging Pain Management and Medication Current Pain Management: Medication: Yes Cold Application: No Rest: Yes Massage: No Activity: No T.E.N.S.: No Heat Application: No Leg drop or elevation: No Is the Current Pain Management Adequate: Inadequate How does your wound impact your activities of daily livingo Sleep: No Bathing: No Appetite: No Relationship With Others: No Bladder Continence: No Emotions: No Bowel Continence: No Work: No Toileting: No Drive: No Dressing: No Hobbies: No Engineer, maintenance) Signed: 04/12/2022 8:23:09 AM By: Gretta Cool, BSN, RN, CWS, [name] RN, BSN Signed: 04/12/2022 5:15:48 PM By: Massie Kluver Entered By: Massie Kluver on 04/11/2022 12:57:29 -------------------------------------------------------------------------------- Patient/Caregiver Education  Details Patient Name: Date of Service: [name]. 11/6/2023andnbsp12:45 PM Medical Record Number: [id number] Patient Account Number: [id number] Date of Birth/Gender: Treating RN: 1950-10-26 (71 y.o. [name] Primary Care Physician: [name] Other Clinician: Massie Kluver Referring Physician: Treating Physician/Extender: RO BSO N, MICHA EL Marcine Matar, Almond Lint in Treatment: 18 Education Assessment Education Provided To: Patient Education Topics Provided Wound/Skin Impairment: Handouts: Other: continue wound care as directed [name], [name] ([id number]) [id number] Page 7 of 9 Methods: Explain/Verbal Responses: State content correctly Electronic Signature(s) Signed: 04/12/2022 5:15:48 PM By: Massie Kluver Entered By: Massie Kluver on 04/11/2022 13:13:57 -------------------------------------------------------------------------------- Wound Assessment Details Patient Name: Date of Service: [name]. 04/11/2022 12:45 PM Medical Record Number: [id number] Patient Account Number: [id number] Date of Birth/Sex: Treating RN: 1951/02/23 (71 y.o. [name] Primary Care Collen Vincent: [name] Other Clinician: Massie Kluver Referring Marifer Hurd: Treating [place] Revoir/Extender: RO BSO N, MICHA EL G LEE, KEUNG Weeks in Treatment: 18 Wound Status Wound Number: 2 Primary Etiology: Venous Leg Ulcer Wound Location: Right, Lateral Lower Leg Wound Status: Open Wounding Event: Gradually Appeared Comorbid History:  Hypertension, Peripheral Arterial Disease Date Acquired: 07/08/2021 Weeks Of Treatment: 18 Clustered Wound: No Photos Wound Measurements Length: (cm) 5 Width: (cm) 1 Depth: (cm) 0.2 Area: (cm) 3.927 Volume: (cm) 0.785 % Reduction in Area: 55.4% % Reduction in Volume: 77.7% Epithelialization: None Tunneling: No Undermining: No Wound Description Classification: Full Thickness Without Exposed Support Structures Exudate Amount:  Medium Exudate Type: Serosanguineous Exudate Color: red, brown Foul Odor After Cleansing: No Slough/Fibrino Yes Wound Bed Granulation Amount: Large (67-100%) Exposed Structure Granulation Quality: Red Fascia Exposed: No Necrotic Amount: Small (1-33%) Fat Layer (Subcutaneous Tissue) Exposed: Yes Necrotic Quality: Adherent Slough Tendon Exposed: No Muscle Exposed: No Joint Exposed: No [name], [name] ([id number]) [id number] Page 8 of 9 Bone Exposed: No Treatment Notes Wound #2 (Lower Leg) Wound Laterality: Right, Lateral Cleanser Hibiclens, 16 (oz) Discharge Instruction: Put Hibiclens on your skin and rub it in gently for five minutes with a washcloth. Turn the water back on and rinse very well with warm water. Do not use your regular soap after using and rinsing Hibiclens. Pat yourself dry with a clean towel. Peri-Wound Care Topical Clobetasol Propionate ointment 0.05%, 60 (g) tube Discharge Instruction: Apply as directed. Given by Dermatologist Primary Dressing Silvercel 4 1/4x 4 1/4 (in/in) Discharge Instruction: Apply Silvercel 4 1/4x 4 1/4 (in/in) as instructed Secondary Dressing ABD Pad 5x9 (in/in) Discharge Instruction: Cover with ABD pad Secured With [place] H Soft Cloth Surgical T ape ape, 2x2 (in/yd) Conform 4'' - Conforming Stretch Gauze Bandage 4x75 (in/in) Discharge Instruction: Apply as directed Tubigrip Size D, 3x10 (in/yd) Compression Wrap Compression Stockings Add-Ons Electronic Signature(s) Signed: 04/12/2022 8:23:09 AM By: Gretta Cool, BSN, RN, CWS, [name] RN, BSN Signed: 04/12/2022 5:15:48 PM By: Massie Kluver Entered By: Massie Kluver on 04/11/2022 13:06:41 -------------------------------------------------------------------------------- Vitals Details Patient Name: Date of Service: [name]. 04/11/2022 12:45 PM Medical Record Number: [id number] Patient Account Number: [id number] Date of Birth/Sex:  Treating RN: 10/30/1950 (71 y.o. [name] Primary Care Javyn Havlin: [name] Other Clinician: Massie Kluver Referring Ajamu Maxon: Treating Avyana Puffenbarger/Extender: RO BSO N, MICHA EL G LEE, KEUNG Weeks in Treatment: 18 Vital Signs Time Taken: 12:55 Temperature (F): 98.4 Height (in): 63 Pulse (bpm): 106 Weight (lbs): 147 Respiratory Rate (breaths/min): 18 Body Mass Index (BMI): 26 Blood Pressure (mmHg): 130/83 Reference Range: 80 - 120 mg / dl Electronic Signature(s) Signed: 04/12/2022 5:15:48 PM By: Lucrezia Europe, [name] ([id number]) [id number] Page 9 of 9 Entered By: Massie Kluver on 04/11/2022 12:57:24

## 2022-04-13 NOTE — Progress Notes (Signed)
[name], [name] ([id number]) [id number] Page 1 of 6 Visit Report for 04/11/2022 Debridement Details Patient Name: Date of Service: [name], Brandy Brandy K. 04/11/2022 12:45 PM Medical Record Number: [id number] Patient Account Number: [id number] Date of Birth/Sex: Treating RN: Apr 29, 1951 (71 y.o. [name], [name] Primary Care Provider: [name] Other Clinician: Massie Kluver Referring Provider: Treating Provider/Extender: [name] N, [name], [name] [name]: 18 Debridement Performed for Assessment: Wound #2 Right,Lateral Lower Leg Performed By: Physician Ricard Dillon, MD Debridement Type: Debridement Severity of Tissue Pre Debridement: Fat layer exposed Level of Consciousness (Pre-procedure): Awake and Alert Pre-procedure Verification/Time Out Yes - 13:11 Taken: Start Time: 13:11 T Area Debrided (L x W): otal 2 (cm) x 0.5 (cm) = 1 (cm) Tissue and other material debrided: Viable, Non-Viable, Slough, Slough Level: Non-Viable Tissue Debridement Description: Selective/Open Wound Instrument: Curette Bleeding: Minimum Hemostasis Achieved: Pressure End Time: 13:12 Response to Mccarthy: Procedure was tolerated well Level of Consciousness (Post- Awake and Alert procedure): Post Debridement Measurements of Total Wound Length: (cm) 5 Width: (cm) 1 Depth: (cm) 0.3 Volume: (cm) 1.178 Character of Wound/Ulcer Post Debridement: Stable Severity of Tissue Post Debridement: Fat layer exposed Post Procedure Diagnosis Same as Pre-procedure Electronic Signature(s) Signed: 04/11/2022 4:14:14 PM By: Linton Ham MD Signed: 04/12/2022 8:23:09 AM By: Gretta Cool, BSN, RN, CWS, [name] RN, BSN Entered By: Linton Ham on 04/11/2022 13:22:08 Physical Exam Details -------------------------------------------------------------------------------- [name] ([id number]) [id number] Page 2 of 6 Patient Name: Date of  Service: [name], FREDLUND. 04/11/2022 12:45 PM Medical Record Number: [id number] Patient Account Number: [id number] Date of Birth/Sex: Treating RN: 01/04/1951 (71 y.o. [name] Primary Care Provider: [name] Other Clinician: Massie Kluver Referring Provider: Treating Provider/Extender: [name] N, [name], [name] [name]: 18 Constitutional Sitting or standing Blood Pressure is within target range for patient.. Pulse regular and within target range for patient.Marland Kitchen Respirations regular, non-labored and within target range.. Temperature is normal and within the target range for the patient.Marland Kitchen appears in no distress. Notes Wound examination right lateral lower leg. This has 2 small areas on either side of an epithelialized area. I used a 3 curette to debride surface slough from both of the remaining wound areas but this looks otherwise very healthy edema control is decent. No evidence of infection Electronic Signature(s) Signed: 04/11/2022 4:14:14 PM By: Linton Ham MD Entered By: Linton Ham on 04/11/2022 13:20:34 -------------------------------------------------------------------------------- Physician Orders Details Patient Name: Date of Service: Brandy Shiver [place] K. 04/11/2022 12:45 PM Medical Record Number: [id number] Patient Account Number: [id number] Date of Birth/Sex: Treating RN: [birth year] (71 y.o. [name] Primary Care Provider: [name] Other Clinician: Massie Kluver Referring Provider: Treating Provider/Extender: [name] N, [name], [name] [name]: 71 Verbal / Phone Orders: No Diagnosis Coding Follow-up Appointments Return Appointment in 1 week. Bathing/ Shower/ Hygiene May shower; gently cleanse wound with antibacterial soap, rinse and pat dry prior to dressing wounds - Hibiclens recommended for washing right lower leg prior to dressing changes. You may pick this up at a pharmacy or purchase on [place] No tub bath. Edema  Control - Lymphedema / Segmental Compressive Device / Other Tubigrip double layer applied - Tubi D double layer Elevate, Exercise Daily and A void Standing for Long Periods of Time. Elevate legs to the level of the heart and pump ankles as often as possible Elevate leg(s) parallel to the floor when sitting. Wound Mccarthy Wound #2 - Lower Leg Wound Laterality:  Right, Lateral Cleanser: Hibiclens, 16 (oz) 3 x Per Week/30 Days Discharge Instructions: Put Hibiclens on your skin and rub it in gently for five minutes with a washcloth. Turn the water back on and rinse very well with warm water. Do not use your regular soap after using and rinsing Hibiclens. Pat yourself dry with a clean towel. Topical: Clobetasol Propionate ointment 0.05%, 60 (g) tube 3 x Per Week/30 Days Discharge Instructions: Apply as directed. Given by Dermatologist Prim Dressing: Silvercel 4 1/4x 4 1/4 (in/in) 3 x Per Week/30 Days ary Discharge Instructions: Apply Silvercel 4 1/4x 4 1/4 (in/in) as instructed Secondary Dressing: ABD Pad 5x9 (in/in) 3 x Per Week/30 Days Discharge Instructions: Cover with ABD pad Secured With: Medipore T - 110M Medipore H Soft Cloth Surgical T ape ape, 2x2 (in/yd) 3 x Per Week/30 Days [name], [name] ([id number]) [id number] Page 3 of 6 Secured With: Conform 4'' - Conforming Stretch Gauze Bandage 4x75 (in/in) 3 x Per Week/30 Days Discharge Instructions: Apply as directed Secured With: Tubigrip Size D, 3x10 (in/yd) 3 x Per Week/30 Days Electronic Signature(s) Signed: 04/11/2022 4:14:14 PM By: Linton Ham MD Signed: 04/12/2022 5:15:48 PM By: Massie Kluver Entered By: Massie Kluver on 04/11/2022 13:13:31 -------------------------------------------------------------------------------- Problem List Details Patient Name: Date of Service: Brandy Shiver Brandy K. 04/11/2022 12:45 PM Medical Record Number: [id number] Patient Account Number: [id number] Date of Birth/Sex:  Treating RN: 01-29-1951 (71 y.o. [name], [name] Primary Care Provider: [name] Other Clinician: Massie Kluver Referring Provider: Treating Provider/Extender: [name] N, Brandy EL Marcine Matar, [name] [name]: 18 Active Problems ICD-10 Encounter Code Description Active Date MDM Diagnosis I87.333 Chronic venous hypertension (idiopathic) with ulcer and inflammation of 11/30/2021 No Yes bilateral lower extremity L97.812 Non-pressure chronic ulcer of other part of right lower leg with fat layer 11/30/2021 No Yes exposed L03.115 Cellulitis of right lower limb 11/30/2021 No Yes S51.812A Laceration without foreign body of left forearm, initial encounter 01/25/2022 No Yes L97.822 Non-pressure chronic ulcer of other part of left lower leg with fat layer exposed10/07/2021 No Yes I73.89 Other specified peripheral vascular diseases 11/30/2021 No Yes Z89.421 Acquired absence of other right toe(s) 11/30/2021 No Yes Inactive Problems Resolved Problems [name], [name] ([id number]) [id number] Page 4 of 6 Electronic Signature(s) Signed: 04/11/2022 4:14:14 PM By: Linton Ham MD Entered By: Linton Ham on 04/11/2022 13:19:43 -------------------------------------------------------------------------------- Progress Note Details Patient Name: Date of Service: Brandy Shiver Brandy K. 04/11/2022 12:45 PM Medical Record Number: [id number] Patient Account Number: [id number] Date of Birth/Sex: Treating RN: 21-Apr-1951 (71 y.o. [name] Primary Care Provider: [name] Other Clinician: Massie Kluver Referring Provider: Treating Provider/Extender: [name] N, [name], [name] [name]: 18 Subjective Objective Constitutional Sitting or standing Blood Pressure is within target range for patient.. Pulse regular and within target range for patient.Marland Kitchen Respirations regular, non-labored and within target range.. Temperature is normal and within the target range for  the patient.Marland Kitchen appears in no distress. Vitals Time Taken: 12:55 PM, Height: 63 in, Weight: 147 lbs, BMI: 26, Temperature: 98.4 F, Pulse: 106 bpm, Respiratory Rate: 18 breaths/min, Blood Pressure: 130/83 mmHg. General Notes: Wound examination right lateral lower leg. This has 2 small areas on either side of an epithelialized area. I used a 3 curette to debride surface slough from both of the remaining wound areas but this looks otherwise very healthy edema control is decent. No evidence of infection Integumentary (Hair, Skin) Wound #2 status is Open. Original cause of wound was Gradually Appeared. The  date acquired was: 07/08/2021. The wound has been in Mccarthy 18 Brandy. The wound is located on the Right,Lateral Lower Leg. The wound measures 5cm length x 1cm width x 0.2cm depth; 3.927cm^2 area and 0.785cm^3 volume. There is Fat Layer (Subcutaneous Tissue) exposed. There is no tunneling or undermining noted. There is a medium amount of serosanguineous drainage noted. There is large (67-100%) red granulation within the wound bed. There is a small (1-33%) amount of necrotic tissue within the wound bed including Adherent Slough. Assessment Active Problems ICD-10 Chronic venous hypertension (idiopathic) with ulcer and inflammation of bilateral lower extremity Non-pressure chronic ulcer of other part of right lower leg with fat layer exposed Cellulitis of right lower limb Laceration without foreign body of left forearm, initial encounter Non-pressure chronic ulcer of other part of left lower leg with fat layer exposed Other specified peripheral vascular diseases Acquired absence of other right toe(s) Procedures Wound #2 Pre-procedure diagnosis of Wound #2 is a Venous Leg Ulcer located on the Right,Lateral Lower Leg .Severity of Tissue Pre Debridement is: Fat layer exposed. [name], [name] ([id number]) [id number] Page 5 of 6 There was a Selective/Open Wound Non-Viable  Tissue Debridement with a total area of 1 sq cm performed by Ricard Dillon, MD. With the following instrument(s): Curette to remove Viable and Non-Viable tissue/material. Material removed includes [place]. A time out was conducted at 13:11, prior to the start of the procedure. A Minimum amount of bleeding was controlled with Pressure. The procedure was tolerated well. Post Debridement Measurements: 5cm length x 1cm width x 0.3cm depth; 1.178cm^3 volume. Character of Wound/Ulcer Post Debridement is stable. Severity of Tissue Post Debridement is: Fat layer exposed. Post procedure Diagnosis Wound #2: Same as Pre-Procedure Plan Follow-up Appointments: Return Appointment in 1 week. Bathing/ Shower/ Hygiene: May shower; gently cleanse wound with antibacterial soap, rinse and pat dry prior to dressing wounds - Hibiclens recommended for washing right lower leg prior to dressing changes. You may pick this up at a pharmacy or purchase on [place] No tub bath. Edema Control - Lymphedema / Segmental Compressive Device / Other: Tubigrip double layer applied - Tubi D double layer Elevate, Exercise Daily and Avoid Standing for Long Periods of Time. Elevate legs to the level of the heart and pump ankles as often as possible Elevate leg(s) parallel to the floor when sitting. WOUND #2: - Lower Leg Wound Laterality: Right, Lateral Cleanser: Hibiclens, 16 (oz) 3 x Per Week/30 Days Discharge Instructions: Put Hibiclens on your skin and rub it in gently for five minutes with a washcloth. Turn the water back on and rinse very well with warm water. Do not use your regular soap after using and rinsing Hibiclens. Pat yourself dry with a clean towel. Topical: Clobetasol Propionate ointment 0.05%, 60 (g) tube 3 x Per Week/30 Days Discharge Instructions: Apply as directed. Given by Dermatologist Prim Dressing: Silvercel 4 1/4x 4 1/4 (in/in) 3 x Per Week/30 Days ary Discharge Instructions: Apply Silvercel 4 1/4x 4  1/4 (in/in) as instructed Secondary Dressing: ABD Pad 5x9 (in/in) 3 x Per Week/30 Days Discharge Instructions: Cover with ABD pad Secured With: Medipore T - 65M Medipore H Soft Cloth Surgical T ape ape, 2x2 (in/yd) 3 x Per Week/30 Days Secured With: Conform 4'' - Conforming Stretch Gauze Bandage 4x75 (in/in) 3 x Per Week/30 Days Discharge Instructions: Apply as directed Secured With: Tubigrip Size D, 3x10 (in/yd) 3 x Per Week/30 Days 1. We continued with the same primary dressing which is silver cell ABDs and  Tubigrip 2. She has chronic venous hypertension not clear to me if she has functional compression stockings that may need to be sorted out next week. Electronic Signature(s) Signed: 04/11/2022 4:14:14 PM By: Linton Ham MD Entered By: Linton Ham on 04/11/2022 13:21:39 -------------------------------------------------------------------------------- SuperBill Details Patient Name: Date of Service: Brandy Shiver [place] K. 04/11/2022 Medical Record Number: [id number] Patient Account Number: [id number] Date of Birth/Sex: Treating RN: 11-02-1950 (71 y.o. [name], [name] Primary Care Provider: [name] Other Clinician: Massie Kluver Referring Provider: Treating Provider/Extender: [name] N, [name], [name] [name]: 18 Diagnosis Coding ICD-10 Codes Code Description I87.333 Chronic venous hypertension (idiopathic) with ulcer and inflammation of bilateral lower extremity L97.812 Non-pressure chronic ulcer of other part of right lower leg with fat layer exposed L03.115 Cellulitis of right lower limb [name], [name] ([id number]) [id number] Page 6 of 6 [phone] Laceration without foreign body of left forearm, initial encounter [phone] Non-pressure chronic ulcer of other part of left lower leg with fat layer exposed I73.89 Other specified peripheral vascular diseases Z89.421 Acquired absence of other right toe(s) Facility Procedures : CPT4  Code: 14709295 Description: 74734 - DEBRIDE WOUND 1ST 20 SQ CM OR < ICD-10 Diagnosis Description Y37.096 Non-pressure chronic ulcer of other part of right lower leg with fat layer expos Modifier: ed Quantity: 1 Physician Procedures : CPT4 Code Description Modifier [id number] - WC PHYS DEBR WO ANESTH 20 SQ CM ICD-10 Diagnosis Description O36.067 Non-pressure chronic ulcer of other part of right lower leg with fat layer exposed Quantity: 1 Electronic Signature(s) Signed: 04/11/2022 4:14:14 PM By: Linton Ham MD Entered By: Linton Ham on 04/11/2022 13:21:54

## 2022-04-18 ENCOUNTER — Encounter: Payer: Medicare HMO | Admitting: Physician Assistant

## 2022-04-18 DIAGNOSIS — I87333 Chronic venous hypertension (idiopathic) with ulcer and inflammation of bilateral lower extremity: Secondary | ICD-10-CM | POA: Diagnosis not present

## 2022-04-18 NOTE — Progress Notes (Signed)
[name], [name] ([id number]) [id number] Page 1 of 9 Visit Report for 04/18/2022 Chief Complaint Document Details Patient Name: Date of Service: [name], Brandy [name] 04/18/2022 10:30 A M Medical Record Number: [id number] Patient Account Number: [id number] Date of Birth/Sex: Treating RN: 09/23/1950 (71 y.o. [name] Primary Care Provider: [name] Other Clinician: Massie Kluver Referring Provider: Treating Provider/Extender: [name] Weeks in Treatment: 43 Information Obtained from: Patient Chief Complaint Bilateral LE Ulcers Electronic Signature(s) Signed: 04/18/2022 11:04:19 AM By: Worthy Keeler PA-C Entered By: Worthy Keeler on 04/18/2022 11:04:18 -------------------------------------------------------------------------------- Debridement Details Patient Name: Date of Service: Brandy Shiver [name] 04/18/2022 10:30 A M Medical Record Number: [id number] Patient Account Number: [id number] Date of Birth/Sex: Treating RN: [birth year] (71 y.o. [name] Primary Care Provider: [name] Other Clinician: Massie Kluver Referring Provider: Treating Provider/Extender: [name] Weeks in Treatment: 19 Debridement Performed for Assessment: Wound #2 Right,Lateral Lower Leg Performed By: Physician Tommie Sams., PA-C Debridement Type: Debridement Severity of Tissue Pre Debridement: Fat layer exposed Level of Consciousness (Pre-procedure): Awake and Alert Pre-procedure Verification/Time Out Yes - 11:19 Taken: Start Time: 11:19 T Area Debrided (L x W): otal 1 (cm) x 1.5 (cm) = 1.5 (cm) Tissue and other material debrided: Viable, Non-Viable, Slough, Subcutaneous, Slough, Other: calcium deposit Level: Skin/Subcutaneous Tissue Debridement Description: Excisional Instrument: Curette Bleeding: Minimum Hemostasis Achieved: Pressure Response to Treatment: Procedure was tolerated well Level of Consciousness (Post- Awake  and Alert procedure): [name], [name] ([id number]) [id number] Page 2 of 9 Post Debridement Measurements of Total Wound Length: (cm) 4.7 Width: (cm) 4 Depth: (cm) 0.2 Volume: (cm) 2.953 Character of Wound/Ulcer Post Debridement: Stable Severity of Tissue Post Debridement: Fat layer exposed Post Procedure Diagnosis Same as Pre-procedure Electronic Signature(s) Signed: 04/19/2022 7:52:55 AM By: Massie Kluver Signed: 04/19/2022 5:24:20 PM By: Gretta Cool, BSN, RN, CWS, [name] RN, BSN Signed: 04/20/2022 2:43:36 PM By: Worthy Keeler PA-C Entered By: Massie Kluver on 04/18/2022 11:24:52 -------------------------------------------------------------------------------- HPI Details Patient Name: Date of Service: Brandy Shiver [name] 04/18/2022 10:30 A M Medical Record Number: [id number] Patient Account Number: [id number] Date of Birth/Sex: Treating RN: [birth year] (71 y.o. [name] Primary Care Provider: [name] Other Clinician: Massie Kluver Referring Provider: Treating Provider/Extender: [name] Weeks in Treatment: 71 History of Present Illness HPI Description: 11-30-2021 upon evaluation today patient presents for initial inspection here in our clinic concerning wounds that she has over the right lateral leg as well as the right medial leg. Subsequently the patient is dealing with lower extremity edema which I do believe has been one of the big causes of what is going on here. She also does have what appears to be cellulitis of the leg as well as peripheral vascular disease. She already has amputation of a toe on the right foot. With that being said I did review that surgical pathology from the amputation this was the right third toe which showed a gangrenous ulcer with underlying cellulitis negative for acute osteomyelitis but acute cellulitis was present in the proximal margin. There was also noted on this pathology report a biopsy from the  right leg which showed a gangrenous ulcer with underlying cellulitis and foreign body type giant cell reaction. That was performed on 11-11-2021 and resulted on 11-15-2021 She is also had lower extremity arterial duplex studies which did show that she had falsely elevated ABI on the right with a TBI of 0.45. On the left she had an ABI of 1.05  with a TBI of 0.12. She is pretty much monophasic throughout. With that being said there was definite reason for concern with regard to her blood flow into the legs she does have a follow-up with the vascular surgeon coming up shortly as well. 12-14-2021 upon evaluation today patient appears to be doing decently well currently in regard to her wound. We are definitely seeing some signs of improvement there is definitely some necrotic tissue as well but try to clean some this away carefully today. Fortunately I think that she is going to tolerate the debridement without complication she otherwise seems to be healing excellent. I think get a lot of the dead tissue clearway will make a big difference as far as her healing is concerned. 12-21-2021 upon evaluation today patient appears to be doing well currently in regard to her legs she has been using the Hibiclens which she tells me seems to be helping. She tells me the leg feels much better washing with this compared to what it was previous which is great news. Fortunately I do not see any signs of active infection and I am overall extremely pleased with where we stand. I do think we are on the right track here. 01-04-2022 upon evaluation today patient appears to be doing unfortunately somewhat worse in regard to the new pustule areas that are appearing on her lower extremity. Again I have discussed with her based on what I am seeing that though she does have a wound I do not feel like these new pustule areas are related to the wound in particular. Again I am unsure of exactly what is going on and I really think she would  benefit from seeing a dermatology specialist. She voiced understanding and is not opposed to this she did see a dermatologist in [place] but they were under the impression they were just to do a biopsy which she had already had and therefore really did not do much she tells me that so I really know about that encounter. With that being said in general I do feel like that the patient is doing better although that is more in relation to the wound I think these new pustule areas still have me concerned. I do think she could be a candidate for topical antibiotics such as [employer] but at the same time I do think that we need to continue to monitor for any signs of overall worsening infection in the interim. 01-11-2022 upon evaluation today patient appears to be doing well currently in regard to her wounds nothing seems to be doing worse although she continues to have issues with multiple pustules forming I am really not certain that this is actually infection. I am beginning to suspect something rheumatologic/autoimmune. With that being said she did see dermatology today and it was felt by the dermatologist that this is likely infectious despite the fact we had a negative culture from when I saw her last week. They did repeat a culture on one of the pustules. 01-25-2022 upon evaluation today patient appears to be doing better currently in regard to her wounds in general. Everything is showing signs of improvement which is great news. Fortunately I see no evidence of active infection locally or systemically at this time which is great news. She does have a new skin tear CHASLYN, EISEN ([id number]) [id number] Page 3 of 9 on her forearm but this she did try to pull back over and has done quite well. 02-08-2022 upon evaluation today patient appears to  be doing excellent currently in regard to her wound. She has been tolerating the dressing changes without complication and again she  still seems to be making progress. Despite this she still has a lot of blisters that keep popping up again I have cultured them and they have been cultured twice other once at dermatology once I believe with primary care never have we identified any causative organisms. I am beginning to believe in fact have made a referral to Dr. Posey Pronto, rheumatology, in order to see if there is some other type of inflammatory process they were dealing with here. I think that this is becoming increasingly likely. 02-21-2022 upon evaluation today patient appears to be doing well currently in regard to her wounds. In fact everything is showing signs of improvement which is good news she has found some compression that she was interested in that seems to have been doing extremely well at this point. I feel like that may be part of what is improving the here overall. 03-07-2022 upon evaluation today patient actually is making some good progress here in regard to her right leg I am very pleased in this regard. With that being said she does have a wound on her left leg which does appear to be a venous leg ulcer that occurred as a result of a blister that arose and then burst over the past week. Fortunately there does not appear to be any evidence of active infection at this time which is great news. No fevers, chills, nausea, vomiting, or diarrhea. 03-21-2022 upon evaluation patient's wound is actually showing signs of significant improvement which is great news and overall I am extremely pleased with where we stand today. I do not see any evidence of infection locally or systemically which is great news. No fevers, chills, nausea, vomiting, or diarrhea. 04-04-2022 upon evaluation today patient appears to be doing better in regard to her right lateral ulcer. The anterior ulcer on the leg is also doing better. She still has several of these pustules that come and go but again they are not significant compared to last time I saw  her even. I do believe the clobetasol that was given by dermatology has been of benefit as well and this is great news. 04-19-2022 upon evaluation today patient appears to be doing well actually in regard to her wounds. She has been tolerating the dressing changes without complication every time I see her things actually seem to be getting better which is great news. Fortunately I do not see any evidence of active infection locally nor systemically at this time which is great news. No fevers, chills, nausea, vomiting, or diarrhea. Electronic Signature(s) Signed: 04/19/2022 8:40:08 AM By: Worthy Keeler PA-C Entered By: Worthy Keeler on 04/19/2022 08:40:07 -------------------------------------------------------------------------------- Physical Exam Details Patient Name: Date of Service: [name], Brandy [name] 04/18/2022 10:30 A M Medical Record Number: [id number] Patient Account Number: [id number] Date of Birth/Sex: Treating RN: Mar 25, 1951 (71 y.o. [name] Primary Care Provider: [name] Other Clinician: Massie Kluver Referring Provider: Treating Provider/Extender: [name] Weeks in Treatment: 81 Constitutional Well-nourished and well-hydrated in no acute distress. Respiratory normal breathing without difficulty. Psychiatric this patient is able to make decisions and demonstrates good insight into disease process. Alert and Oriented x 3. pleasant and cooperative. Notes Upon inspection patient's wound did require some sharp debridement clearway some of the necrotic debris she tolerated that today without complication postdebridement the wound bed appears to be doing much better which is  great news and overall I am extremely pleased with where things stand currently. Electronic Signature(s) Signed: 04/19/2022 8:40:40 AM By: Worthy Keeler PA-C Entered By: Worthy Keeler on 04/19/2022 08:40:40 [place], [name] ([id number]) [id number]  Page 4 of 9 -------------------------------------------------------------------------------- Physician Orders Details Patient Name: Date of Service: [name], Brandy [name] 04/18/2022 10:30 A M Medical Record Number: [id number] Patient Account Number: [id number] Date of Birth/Sex: Treating RN: 03/21/1951 (71 y.o. [name], [name] Primary Care Provider: [name] Other Clinician: Massie Kluver Referring Provider: Treating Provider/Extender: [name] Weeks in Treatment: 41 Verbal / Phone Orders: No Diagnosis Coding ICD-10 Coding Code Description [phone] Chronic venous hypertension (idiopathic) with ulcer and inflammation of bilateral lower extremity L97.812 Non-pressure chronic ulcer of other part of right lower leg with fat layer exposed L03.115 Cellulitis of right lower limb S51.812A Laceration without foreign body of left forearm, initial encounter L97.822 Non-pressure chronic ulcer of other part of left lower leg with fat layer exposed I73.89 Other specified peripheral vascular diseases Z89.421 Acquired absence of other right toe(s) Follow-up Appointments Return Appointment in 1 week. Bathing/ Shower/ Hygiene May shower; gently cleanse wound with antibacterial soap, rinse and pat dry prior to dressing wounds - Hibiclens recommended for washing right lower leg prior to dressing changes. You may pick this up at a pharmacy or purchase on [place] No tub bath. Edema Control - Lymphedema / Segmental Compressive Device / Other Tubigrip double layer applied - Tubi D double layer Elevate, Exercise Daily and A void Standing for Long Periods of Time. Elevate legs to the level of the heart and pump ankles as often as possible Elevate leg(s) parallel to the floor when sitting. Wound Treatment Wound #2 - Lower Leg Wound Laterality: Right, Lateral Cleanser: Hibiclens, 16 (oz) 3 x Per Week/30 Days Discharge Instructions: Put Hibiclens on your skin and rub it in gently for five minutes  with a washcloth. Turn the water back on and rinse very well with warm water. Do not use your regular soap after using and rinsing Hibiclens. Pat yourself dry with a clean towel. Topical: Clobetasol Propionate ointment 0.05%, 60 (g) tube 3 x Per Week/30 Days Discharge Instructions: Apply as directed. Given by Dermatologist Prim Dressing: Silvercel 4 1/4x 4 1/4 (in/in) 3 x Per Week/30 Days ary Discharge Instructions: Apply Silvercel 4 1/4x 4 1/4 (in/in) as instructed Secondary Dressing: ABD Pad 5x9 (in/in) 3 x Per Week/30 Days Discharge Instructions: Cover with ABD pad Secured With: Medipore T - 18M Medipore H Soft Cloth Surgical T ape ape, 2x2 (in/yd) 3 x Per Week/30 Days Secured With: Conform 4'' - Conforming Stretch Gauze Bandage 4x75 (in/in) 3 x Per Week/30 Days Discharge Instructions: Apply as directed Secured With: Tubigrip Size D, 3x10 (in/yd) 3 x Per Week/30 Days Electronic Signature(s) Signed: 04/19/2022 7:52:55 AM By: Massie Kluver Signed: 04/20/2022 2:43:36 PM By: Worthy Keeler PA-C [place],Signed: 04/20/2022 2:43:36 PM By: Blane Ohara K ([id number]) [id number] Page 5 of 9 Entered By: Massie Kluver on 04/18/2022 11:25:31 -------------------------------------------------------------------------------- Problem List Details Patient Name: Date of Service: [name], Brandy [name] 04/18/2022 10:30 A M Medical Record Number: [id number] Patient Account Number: [id number] Date of Birth/Sex: Treating RN: Nov 01, 1950 (71 y.o. [name] Primary Care Provider: [name] Other Clinician: Massie Kluver Referring Provider: Treating Provider/Extender: [name] Weeks in Treatment: 59 Active Problems ICD-10 Encounter Code Description Active Date MDM Diagnosis I87.333 Chronic venous hypertension (idiopathic) with ulcer and inflammation of 11/30/2021 No Yes bilateral lower extremity  V25.366 Non-pressure chronic ulcer of other  part of right lower leg with fat layer 11/30/2021 No Yes exposed L03.115 Cellulitis of right lower limb 11/30/2021 No Yes S51.812A Laceration without foreign body of left forearm, initial encounter 01/25/2022 No Yes L97.822 Non-pressure chronic ulcer of other part of left lower leg with fat layer exposed10/07/2021 No Yes I73.89 Other specified peripheral vascular diseases 11/30/2021 No Yes Z89.421 Acquired absence of other right toe(s) 11/30/2021 No Yes Inactive Problems Resolved Problems Electronic Signature(s) Signed: 04/18/2022 11:04:07 AM By: Worthy Keeler PA-C Entered By: Worthy Keeler on 04/18/2022 11:04:07 [place], [name] ([id number]) [id number] Page 6 of 9 -------------------------------------------------------------------------------- Progress Note Details Patient Name: Date of Service: [name], Brandy [name] 04/18/2022 10:30 A M Medical Record Number: [id number] Patient Account Number: [id number] Date of Birth/Sex: Treating RN: [birth year] (71 y.o. [name] Primary Care Provider: [name] Other Clinician: Massie Kluver Referring Provider: Treating Provider/Extender: [name] Weeks in Treatment: 35 Subjective Chief Complaint Information obtained from Patient Bilateral LE Ulcers History of Present Illness (HPI) 11-30-2021 upon evaluation today patient presents for initial inspection here in our clinic concerning wounds that she has over the right lateral leg as well as the right medial leg. Subsequently the patient is dealing with lower extremity edema which I do believe has been one of the big causes of what is going on here. She also does have what appears to be cellulitis of the leg as well as peripheral vascular disease. She already has amputation of a toe on the right foot. With that being said I did review that surgical pathology from the amputation this was the right third toe which showed a gangrenous ulcer with underlying  cellulitis negative for acute osteomyelitis but acute cellulitis was present in the proximal margin. There was also noted on this pathology report a biopsy from the right leg which showed a gangrenous ulcer with underlying cellulitis and foreign body type giant cell reaction. That was performed on 11-11-2021 and resulted on 11-15-2021 She is also had lower extremity arterial duplex studies which did show that she had falsely elevated ABI on the right with a TBI of 0.45. On the left she had an ABI of 1.05 with a TBI of 0.12. She is pretty much monophasic throughout. With that being said there was definite reason for concern with regard to her blood flow into the legs she does have a follow-up with the vascular surgeon coming up shortly as well. 12-14-2021 upon evaluation today patient appears to be doing decently well currently in regard to her wound. We are definitely seeing some signs of improvement there is definitely some necrotic tissue as well but try to clean some this away carefully today. Fortunately I think that she is going to tolerate the debridement without complication she otherwise seems to be healing excellent. I think get a lot of the dead tissue clearway will make a big difference as far as her healing is concerned. 12-21-2021 upon evaluation today patient appears to be doing well currently in regard to her legs she has been using the Hibiclens which she tells me seems to be helping. She tells me the leg feels much better washing with this compared to what it was previous which is great news. Fortunately I do not see any signs of active infection and I am overall extremely pleased with where we stand. I do think we are on the right track here. 01-04-2022 upon evaluation today patient appears to be doing unfortunately somewhat  worse in regard to the new pustule areas that are appearing on her lower extremity. Again I have discussed with her based on what I am seeing that though she does have a  wound I do not feel like these new pustule areas are related to the wound in particular. Again I am unsure of exactly what is going on and I really think she would benefit from seeing a dermatology specialist. She voiced understanding and is not opposed to this she did see a dermatologist in [place] but they were under the impression they were just to do a biopsy which she had already had and therefore really did not do much she tells me that so I really know about that encounter. With that being said in general I do feel like that the patient is doing better although that is more in relation to the wound I think these new pustule areas still have me concerned. I do think she could be a candidate for topical antibiotics such as [employer] but at the same time I do think that we need to continue to monitor for any signs of overall worsening infection in the interim. 01-11-2022 upon evaluation today patient appears to be doing well currently in regard to her wounds nothing seems to be doing worse although she continues to have issues with multiple pustules forming I am really not certain that this is actually infection. I am beginning to suspect something rheumatologic/autoimmune. With that being said she did see dermatology today and it was felt by the dermatologist that this is likely infectious despite the fact we had a negative culture from when I saw her last week. They did repeat a culture on one of the pustules. 01-25-2022 upon evaluation today patient appears to be doing better currently in regard to her wounds in general. Everything is showing signs of improvement which is great news. Fortunately I see no evidence of active infection locally or systemically at this time which is great news. She does have a new skin tear on her forearm but this she did try to pull back over and has done quite well. 02-08-2022 upon evaluation today patient appears to be doing excellent currently in regard to her wound.  She has been tolerating the dressing changes without complication and again she still seems to be making progress. Despite this she still has a lot of blisters that keep popping up again I have cultured them and they have been cultured twice other once at dermatology once I believe with primary care never have we identified any causative organisms. I am beginning to believe in fact have made a referral to Dr. Posey Pronto, rheumatology, in order to see if there is some other type of inflammatory process they were dealing with here. I think that this is becoming increasingly likely. 02-21-2022 upon evaluation today patient appears to be doing well currently in regard to her wounds. In fact everything is showing signs of improvement which is good news she has found some compression that she was interested in that seems to have been doing extremely well at this point. I feel like that may be part of what is improving the here overall. 03-07-2022 upon evaluation today patient actually is making some good progress here in regard to her right leg I am very pleased in this regard. With that being said she does have a wound on her left leg which does appear to be a venous leg ulcer that occurred as a result of a blister that  arose and then burst over the past week. Fortunately there does not appear to be any evidence of active infection at this time which is great news. No fevers, chills, nausea, vomiting, or diarrhea. 03-21-2022 upon evaluation patient's wound is actually showing signs of significant improvement which is great news and overall I am extremely pleased with where we stand today. I do not see any evidence of infection locally or systemically which is great news. No fevers, chills, nausea, vomiting, or diarrhea. [name], [name] ([id number]) [id number] Page 7 of 9 04-04-2022 upon evaluation today patient appears to be doing better in regard to her right lateral ulcer. The  anterior ulcer on the leg is also doing better. She still has several of these pustules that come and go but again they are not significant compared to last time I saw her even. I do believe the clobetasol that was given by dermatology has been of benefit as well and this is great news. 04-19-2022 upon evaluation today patient appears to be doing well actually in regard to her wounds. She has been tolerating the dressing changes without complication every time I see her things actually seem to be getting better which is great news. Fortunately I do not see any evidence of active infection locally nor systemically at this time which is great news. No fevers, chills, nausea, vomiting, or diarrhea. Objective Constitutional Well-nourished and well-hydrated in no acute distress. Vitals Time Taken: 10:58 AM, Height: 63 in, Weight: 147 lbs, BMI: 26, Temperature: 98.2 F, Pulse: 86 bpm, Respiratory Rate: 18 breaths/min, Blood Pressure: 157/89 mmHg. Respiratory normal breathing without difficulty. Psychiatric this patient is able to make decisions and demonstrates good insight into disease process. Alert and Oriented x 3. pleasant and cooperative. General Notes: Upon inspection patient's wound did require some sharp debridement clearway some of the necrotic debris she tolerated that today without complication postdebridement the wound bed appears to be doing much better which is great news and overall I am extremely pleased with where things stand currently. Integumentary (Hair, Skin) Wound #2 status is Open. Original cause of wound was Gradually Appeared. The date acquired was: 07/08/2021. The wound has been in treatment 19 weeks. The wound is located on the Right,Lateral Lower Leg. The wound measures 4.7cm length x 0.4cm width x 0.2cm depth; 1.477cm^2 area and 0.295cm^3 volume. There is Fat Layer (Subcutaneous Tissue) exposed. There is no tunneling or undermining noted. There is a medium amount of  serosanguineous drainage noted. There is large (67-100%) red granulation within the wound bed. There is a small (1-33%) amount of necrotic tissue within the wound bed including Adherent Slough. Assessment Active Problems ICD-10 Chronic venous hypertension (idiopathic) with ulcer and inflammation of bilateral lower extremity Non-pressure chronic ulcer of other part of right lower leg with fat layer exposed Cellulitis of right lower limb Laceration without foreign body of left forearm, initial encounter Non-pressure chronic ulcer of other part of left lower leg with fat layer exposed Other specified peripheral vascular diseases Acquired absence of other right toe(s) Procedures Wound #2 Pre-procedure diagnosis of Wound #2 is a Venous Leg Ulcer located on the Right,Lateral Lower Leg .Severity of Tissue Pre Debridement is: Fat layer exposed. There was a Excisional Skin/Subcutaneous Tissue Debridement with a total area of 1.5 sq cm performed by Tommie Sams., PA-C. With the following instrument(s): Curette to remove Viable and Non-Viable tissue/material. Material removed includes Subcutaneous Tissue, Slough, and Other: calcium deposit. A time out was conducted at 11:19, prior to the start of  the procedure. A Minimum amount of bleeding was controlled with Pressure. The procedure was tolerated well. Post Debridement Measurements: 4.7cm length x 4cm width x 0.2cm depth; 2.953cm^3 volume. Character of Wound/Ulcer Post Debridement is stable. Severity of Tissue Post Debridement is: Fat layer exposed. Post procedure Diagnosis Wound #2: Same as Pre-Procedure Plan Follow-up Appointments: Return Appointment in 1 week. Bathing/ Shower/ Hygiene: May shower; gently cleanse wound with antibacterial soap, rinse and pat dry prior to dressing wounds - Hibiclens recommended for washing right lower leg prior to dressing changes. You may pick this up at a pharmacy or purchase on Lorrine [place], [place] K  ([id number]) [id number] Page 8 of 9 No tub bath. Edema Control - Lymphedema / Segmental Compressive Device / Other: Tubigrip double layer applied - Tubi D double layer Elevate, Exercise Daily and Avoid Standing for Long Periods of Time. Elevate legs to the level of the heart and pump ankles as often as possible Elevate leg(s) parallel to the floor when sitting. WOUND #2: - Lower Leg Wound Laterality: Right, Lateral Cleanser: Hibiclens, 16 (oz) 3 x Per Week/30 Days Discharge Instructions: Put Hibiclens on your skin and rub it in gently for five minutes with a washcloth. Turn the water back on and rinse very well with warm water. Do not use your regular soap after using and rinsing Hibiclens. Pat yourself dry with a clean towel. Topical: Clobetasol Propionate ointment 0.05%, 60 (g) tube 3 x Per Week/30 Days Discharge Instructions: Apply as directed. Given by Dermatologist Prim Dressing: Silvercel 4 1/4x 4 1/4 (in/in) 3 x Per Week/30 Days ary Discharge Instructions: Apply Silvercel 4 1/4x 4 1/4 (in/in) as instructed Secondary Dressing: ABD Pad 5x9 (in/in) 3 x Per Week/30 Days Discharge Instructions: Cover with ABD pad Secured With: Medipore T - 50M Medipore H Soft Cloth Surgical T ape ape, 2x2 (in/yd) 3 x Per Week/30 Days Secured With: Conform 4'' - Conforming Stretch Gauze Bandage 4x75 (in/in) 3 x Per Week/30 Days Discharge Instructions: Apply as directed Secured With: Tubigrip Size D, 3x10 (in/yd) 3 x Per Week/30 Days 1. I am going to suggest that we have the patient continue to monitor for any signs of worsening or infection. Obviously based on what I am seeing I do believe that she is doing well with the current treatment regimen. She has been utilizing the clobetasol followed by the silver alginate dressing. 2. She also has been seen by Dr. Posey Pronto they did a lot of lab work we do not have the results and interpretation back yet from that she has an appointment  in the next few weeks with Dr. Posey Pronto that had to be pushed back unfortunately. Nonetheless I do feel like she is getting better little by little and again this does not appear to be an infectious process in general which is good news as well. We will see patient back for reevaluation in 1 week here in the clinic. If anything worsens or changes patient will contact our office for additional recommendations. Electronic Signature(s) Signed: 04/19/2022 8:45:03 AM By: Worthy Keeler PA-C Entered By: Worthy Keeler on 04/19/2022 08:45:02 -------------------------------------------------------------------------------- SuperBill Details Patient Name: Date of Service: Brandy Shiver [name] 04/18/2022 Medical Record Number: [id number] Patient Account Number: [id number] Date of Birth/Sex: Treating RN: 1951/01/20 (71 y.o. [name] Primary Care Provider: [name] Other Clinician: Massie Kluver Referring Provider: Treating Provider/Extender: [name] Weeks in Treatment: 19 Diagnosis Coding ICD-10 Codes Code Description [phone] Chronic venous hypertension (idiopathic) with ulcer and inflammation of  bilateral lower extremity L97.812 Non-pressure chronic ulcer of other part of right lower leg with fat layer exposed L03.115 Cellulitis of right lower limb S51.812A Laceration without foreign body of left forearm, initial encounter L97.822 Non-pressure chronic ulcer of other part of left lower leg with fat layer exposed I73.89 Other specified peripheral vascular diseases Z89.421 Acquired absence of other right toe(s) Facility Procedures : ELNOR, RENOVATO Code: [id number] [place] K ([id number] Description: 11042 - DEB SUBQ TISSUE 20 SQ CM/< ICD-10 Diagnosis Description W25.852 Non-pressure chronic ulcer of other part of right lower leg with fat layer expo 0) [id number] Modifier: sed [id number] P Quantity: 1 age 56 of 47 Physician Procedures : CPT4 Code Description  Modifier [id number] 19509 - WC PHYS SUBQ TISS 20 SQ CM ICD-10 Diagnosis Description T26.712 Non-pressure chronic ulcer of other part of right lower leg with fat layer exposed Quantity: 1 Electronic Signature(s) Signed: 04/19/2022 8:45:32 AM By: Worthy Keeler PA-C Entered By: Worthy Keeler on 04/19/2022 08:45:31

## 2022-04-20 NOTE — Progress Notes (Signed)
Brandy, Mccarthy ([id number]) [id number] Page 1 of 8 Visit Report for 04/18/2022 Arrival Information Details Patient Name: Date of Service: [name], ARMWOOD [name] 04/18/2022 10:30 A M Medical Record Number: [id number] Patient Account Number: [id number] Date of Birth/Sex: Treating RN: Oct 15, 1950 (71 y.o. [name], [name] Primary Care Silus Lanzo: [name] Other Clinician: Massie Kluver Referring Bertrum Helmstetter: Treating Alga Southall/Extender: [name] Weeks in Treatment: 28 Visit Information History Since Last Visit All ordered tests and consults were completed: No Patient Arrived: Ambulatory Added or deleted any medications: No Arrival Time: 10:53 Any new allergies or adverse reactions: No Transfer Assistance: None Had a fall or experienced change in No Patient Requires Transmission-Based Precautions: No activities of daily living that may affect Patient Has Alerts: Yes risk of falls: Patient Alerts: Patient on Blood Thinner Signs or symptoms of abuse/neglect since last visito No Hospitalized since last visit: No Implantable device outside of the clinic excluding No cellular tissue based products placed in the center since last visit: Pain Present Now: No Electronic Signature(s) Signed: 04/19/2022 7:52:55 AM By: Massie Kluver Entered By: Massie Kluver on 04/18/2022 10:57:33 -------------------------------------------------------------------------------- Clinic Level of Care Assessment Details Patient Name: Date of Service: [name], BURGO [name] 04/18/2022 10:30 A M Medical Record Number: [id number] Patient Account Number: [id number] Date of Birth/Sex: Treating RN: [birth year] (71 y.o. [name] Primary Care Oren Barella: [name] Other Clinician: Massie Kluver Referring Cadan Maggart: Treating Lacharles Altschuler/Extender: [name] Weeks in Treatment: 35 Clinic Level of Care Assessment Items TOOL 1 Quantity Score '[]'$  - 0 Use when EandM and  Procedure is performed on INITIAL visit ASSESSMENTS - Nursing Assessment / Reassessment '[]'$  - 0 General Physical Exam (combine w/ comprehensive assessment (listed just below) when performed on new pt. evals) '[]'$  - 0 Comprehensive Assessment (HX, ROS, Risk Assessments, Wounds Hx, etc.) SUVI, ARCHULETTA ([id number]) [id number] Page 2 of 8 ASSESSMENTS - Wound and Skin Assessment / Reassessment '[]'$  - 0 Dermatologic / Skin Assessment (not related to wound area) ASSESSMENTS - Ostomy and/or Continence Assessment and Care '[]'$  - 0 Incontinence Assessment and Management '[]'$  - 0 Ostomy Care Assessment and Management (repouching, etc.) PROCESS - Coordination of Care '[]'$  - 0 Simple Patient / Family Education for ongoing care '[]'$  - 0 Complex (extensive) Patient / Family Education for ongoing care '[]'$  - 0 Staff obtains Programmer, systems, Records, T Results / Process Orders est '[]'$  - 0 Staff telephones HHA, Nursing Homes / Clarify orders / etc '[]'$  - 0 Routine Transfer to another Facility (non-emergent condition) '[]'$  - 0 Routine Hospital Admission (non-emergent condition) '[]'$  - 0 New Admissions / Biomedical engineer / Ordering NPWT Apligraf, etc. , '[]'$  - 0 Emergency Hospital Admission (emergent condition) PROCESS - Special Needs '[]'$  - 0 Pediatric / Minor Patient Management '[]'$  - 0 Isolation Patient Management '[]'$  - 0 Hearing / Language / Visual special needs '[]'$  - 0 Assessment of Community assistance (transportation, D/C planning, etc.) '[]'$  - 0 Additional assistance / Altered mentation '[]'$  - 0 Support Surface(s) Assessment (bed, cushion, seat, etc.) INTERVENTIONS - Miscellaneous '[]'$  - 0 External ear exam '[]'$  - 0 Patient Transfer (multiple staff / Civil Service fast streamer / Similar devices) '[]'$  - 0 Simple Staple / Suture removal (25 or less) '[]'$  - 0 Complex Staple / Suture removal (26 or more) '[]'$  - 0 Hypo/Hyperglycemic Management (do not check if billed separately) '[]'$  - 0 Ankle / Brachial  Index (ABI) - do not check if billed separately Has the patient been seen at the hospital within the last three  years: Yes Total Score: 0 Level Of Care: ____ Electronic Signature(s) Signed: 04/19/2022 7:52:55 AM By: Massie Kluver Entered By: Massie Kluver on 04/18/2022 11:25:38 -------------------------------------------------------------------------------- Encounter Discharge Information Details Patient Name: Date of Service: [name] Shiver [name] 04/18/2022 10:30 A M Medical Record Number: [id number] Patient Account Number: [id number] Date of Birth/Sex: Treating RN: 05-30-1951 (71 y.o. [name] Primary Care Richad Ramsay: [name] Other Clinician: Massie Kluver Referring Dravon Nott: Treating Kashena Novitski/Extender: [name] Weeks in Treatment: [address] [place] ([id number]) [id number] Page 3 of 8 Encounter Discharge Information Items Post Procedure Vitals Discharge Condition: Stable Temperature (F): 98.2 Ambulatory Status: Ambulatory Pulse (bpm): 86 Discharge Destination: Home Respiratory Rate (breaths/min): 18 Transportation: Private Auto Blood Pressure (mmHg): 157/89 Accompanied By: self Schedule Follow-up Appointment: Yes Clinical Summary of Care: Electronic Signature(s) Signed: 04/19/2022 7:52:55 AM By: Massie Kluver Entered By: Massie Kluver on 04/19/2022 07:51:46 -------------------------------------------------------------------------------- Lower Extremity Assessment Details Patient Name: Date of Service: [name], [name] [name] 04/18/2022 10:30 A M Medical Record Number: [id number] Patient Account Number: [id number] Date of Birth/Sex: Treating RN: 1951-03-20 (71 y.o. [name] Primary Care Dhamar Gregory: [name] Other Clinician: Massie Kluver Referring Glendene Wyer: Treating Thailan Sava/Extender: [name] Weeks in Treatment: 19 Edema Assessment Assessed: [Left: No] [Right: Yes] Edema: [Left: Ye] [Right:  s] Calf Left: Right: Point of Measurement: 32 cm From Medial Instep 30.5 cm Ankle Left: Right: Point of Measurement: 10 cm From Medial Instep 20.3 cm Vascular Assessment Pulses: Dorsalis Pedis Palpable: [Right:Yes] Electronic Signature(s) Signed: 04/19/2022 7:52:55 AM By: Massie Kluver Signed: 04/19/2022 5:24:20 PM By: Gretta Cool, BSN, RN, CWS, [name] RN, BSN Entered By: Massie Kluver on 04/18/2022 11:08:02 Guthridge, [name] ([id number]) [id number] Page 4 of 8 -------------------------------------------------------------------------------- Multi Wound Chart Details Patient Name: Date of Service: [name], [name] [name] 04/18/2022 10:30 A M Medical Record Number: [id number] Patient Account Number: [id number] Date of Birth/Sex: Treating RN: 11-04-1950 (71 y.o. [name] Primary Care Zaccheaus Storlie: [name] Other Clinician: Massie Kluver Referring Lamondre Wesche: Treating Jalaya Sarver/Extender: [name] Weeks in Treatment: 64 Vital Signs Height(in): 63 Pulse(bpm): 86 Weight(lbs): 147 Blood Pressure(mmHg): 157/89 Body Mass Index(BMI): 26 Temperature(F): 98.2 Respiratory Rate(breaths/min): 18 [2:Photos:] [N/A:N/A] Right, Lateral Lower Leg N/A N/A Wound Location: Gradually Appeared N/A N/A Wounding Event: Venous Leg Ulcer N/A N/A Primary Etiology: Hypertension, Peripheral Arterial N/A N/A Comorbid History: Disease 07/08/2021 N/A N/A Date Acquired: 67 N/A N/A Weeks of Treatment: Open N/A N/A Wound Status: No N/A N/A Wound Recurrence: 4.7x0.4x0.2 N/A N/A Measurements L x W x D (cm) 1.477 N/A N/A A (cm) : rea 0.295 N/A N/A Volume (cm) : 83.20% N/A N/A % Reduction in Area: 91.60% N/A N/A % Reduction in Volume: Full Thickness Without Exposed N/A N/A Classification: Support Structures Medium N/A N/A Exudate Amount: Serosanguineous N/A N/A Exudate Type: red, brown N/A N/A Exudate Color: Large (67-100%) N/A N/A Granulation  Amount: Red N/A N/A Granulation Quality: Small (1-33%) N/A N/A Necrotic Amount: Fat Layer (Subcutaneous Tissue): Yes N/A N/A Exposed Structures: Fascia: No Tendon: No Muscle: No Joint: No Bone: No None N/A N/A Epithelialization: Treatment Notes Electronic Signature(s) Signed: 04/19/2022 7:52:55 AM By: Massie Kluver Entered By: Massie Kluver on 04/18/2022 11:08:19 Coley, [name] ([id number]) [id number] Page 5 of 8 -------------------------------------------------------------------------------- Multi-Disciplinary Care Plan Details Patient Name: Date of Service: [name], [name] [name] 04/18/2022 10:30 A M Medical Record Number: [id number] Patient Account Number: [id number] Date of Birth/Sex: Treating RN: 06-14-1950 (71 y.o. [name] Primary Care Elnora Quizon: [name] Other Clinician: Massie Kluver Referring  Annagrace Carr: Treating Arthea Nobel/Extender: [name] Weeks in Treatment: 70 Active Inactive Wound/Skin Impairment Nursing Diagnoses: Knowledge deficit related to ulceration/compromised skin integrity Goals: Patient/caregiver will verbalize understanding of skin care regimen Date Initiated: 11/30/2021 Target Resolution Date: 12/30/2021 Goal Status: Active Ulcer/skin breakdown will have a volume reduction of 30% by week 4 Date Initiated: 11/30/2021 Target Resolution Date: 01/30/2022 Goal Status: Active Ulcer/skin breakdown will have a volume reduction of 50% by week 8 Date Initiated: 11/30/2021 Target Resolution Date: 03/02/2022 Goal Status: Active Ulcer/skin breakdown will have a volume reduction of 80% by week 12 Date Initiated: 11/30/2021 Target Resolution Date: 04/01/2022 Goal Status: Active Ulcer/skin breakdown will heal within 14 weeks Date Initiated: 11/30/2021 Target Resolution Date: 05/02/2022 Goal Status: Active Interventions: Assess patient/caregiver ability to obtain necessary supplies Assess patient/caregiver ability  to perform ulcer/skin care regimen upon admission and as needed Assess ulceration(s) every visit Notes: Electronic Signature(s) Signed: 04/19/2022 7:52:55 AM By: Massie Kluver Signed: 04/19/2022 5:24:20 PM By: Gretta Cool, BSN, RN, CWS, [name] RN, BSN Entered By: Massie Kluver on 04/18/2022 11:08:10 -------------------------------------------------------------------------------- Pain Assessment Details Patient Name: Date of Service: [name] Shiver [name] 04/18/2022 10:30 A Letitia Caul, [name] ([id number]) [id number] Page 6 of 8 Medical Record Number: [id number] Patient Account Number: [id number] Date of Birth/Sex: Treating RN: [birth year] (71 y.o. [name] Primary Care Travell Desaulniers: [name] Other Clinician: Massie Kluver Referring Wolf Boulay: Treating Jahmar Mckelvy/Extender: [name] Weeks in Treatment: 18 Active Problems Location of Pain Severity and Description of Pain Patient Has Paino No Site Locations Pain Management and Medication Current Pain Management: Electronic Signature(s) Signed: 04/19/2022 7:52:55 AM By: Massie Kluver Signed: 04/19/2022 5:24:20 PM By: Gretta Cool, BSN, RN, CWS, [name] RN, BSN Entered By: Massie Kluver on 04/18/2022 11:06:00 -------------------------------------------------------------------------------- Patient/Caregiver Education Details Patient Name: Date of Service: [name] Shiver [name] 11/13/2023andnbsp10:30 A M Medical Record Number: [id number] Patient Account Number: [id number] Date of Birth/Gender: Treating RN: February 04, 1951 (71 y.o. [name] Primary Care Physician: [name] Other Clinician: Massie Kluver Referring Physician: Treating Physician/Extender: [name] Weeks in Treatment: 11 Education Assessment Education Provided To: Patient Education Topics Provided Wound/Skin Impairment: Handouts: Other: continue wound care as directed Methods: Explain/Verbal Responses: State content  correctly MARAGRET, VANACKER K ([id number]) [id number] Page 7 of 8 Electronic Signature(s) Signed: 04/19/2022 7:52:55 AM By: Massie Kluver Entered By: Massie Kluver on 04/18/2022 11:26:13 -------------------------------------------------------------------------------- Wound Assessment Details Patient Name: Date of Service: [name], [name] [name] 04/18/2022 10:30 A M Medical Record Number: [id number] Patient Account Number: [id number] Date of Birth/Sex: Treating RN: 11/26/1950 (71 y.o. [name], [name] Primary Care Vivica Dobosz: [name] Other Clinician: Massie Kluver Referring Giavonni Cizek: Treating Trashaun Streight/Extender: [name] Weeks in Treatment: 19 Wound Status Wound Number: 2 Primary Etiology: Venous Leg Ulcer Wound Location: Right, Lateral Lower Leg Wound Status: Open Wounding Event: Gradually Appeared Comorbid History: Hypertension, Peripheral Arterial Disease Date Acquired: 07/08/2021 Weeks Of Treatment: 19 Clustered Wound: No Photos Wound Measurements Length: (cm) 4.7 Width: (cm) 0.4 Depth: (cm) 0.2 Area: (cm) 1.477 Volume: (cm) 0.295 % Reduction in Area: 83.2% % Reduction in Volume: 91.6% Epithelialization: None Tunneling: No Undermining: No Wound Description Classification: Full Thickness Without Exposed Support Structures Exudate Amount: Medium Exudate Type: Serosanguineous Exudate Color: red, brown Foul Odor After Cleansing: No Slough/Fibrino Yes Wound Bed Granulation Amount: Large (67-100%) Exposed Structure Granulation Quality: Red Fascia Exposed: No Necrotic Amount: Small (1-33%) Fat Layer (Subcutaneous Tissue) Exposed: Yes Necrotic Quality: Adherent Slough Tendon Exposed: No Muscle Exposed: No Joint Exposed: No Bone Exposed: No  Treatment Notes SHERONDA, PARRAN ([id number]) [id number] Page 8 of 8 Wound #2 (Lower Leg) Wound Laterality: Right, Lateral Cleanser Hibiclens, 16 (oz) Discharge  Instruction: Put Hibiclens on your skin and rub it in gently for five minutes with a washcloth. Turn the water back on and rinse very well with warm water. Do not use your regular soap after using and rinsing Hibiclens. Pat yourself dry with a clean towel. Peri-Wound Care Topical Clobetasol Propionate ointment 0.05%, 60 (g) tube Discharge Instruction: Apply as directed. Given by Dermatologist Primary Dressing Silvercel 4 1/4x 4 1/4 (in/in) Discharge Instruction: Apply Silvercel 4 1/4x 4 1/4 (in/in) as instructed Secondary Dressing ABD Pad 5x9 (in/in) Discharge Instruction: Cover with ABD pad Secured With [place] H Soft Cloth Surgical T ape ape, 2x2 (in/yd) Conform 4'' - Conforming Stretch Gauze Bandage 4x75 (in/in) Discharge Instruction: Apply as directed Tubigrip Size D, 3x10 (in/yd) Compression Wrap Compression Stockings Add-Ons Electronic Signature(s) Signed: 04/19/2022 7:52:55 AM By: Massie Kluver Signed: 04/19/2022 5:24:20 PM By: Gretta Cool, BSN, RN, CWS, [name] RN, BSN Entered By: Massie Kluver on 04/18/2022 11:06:57 -------------------------------------------------------------------------------- [place] Details Patient Name: Date of Service: [name] Shiver [name] 04/18/2022 10:30 A M Medical Record Number: [id number] Patient Account Number: [id number] Date of Birth/Sex: Treating RN: Oct 20, 1950 (71 y.o. [name], [name] Primary Care Nannette Zill: [name] Other Clinician: Massie Kluver Referring Rainah Kirshner: Treating Kelsey Durflinger/Extender: [name] Weeks in Treatment: 60 Vital Signs Time Taken: 10:58 Temperature (F): 98.2 Height (in): 63 Pulse (bpm): 86 Weight (lbs): 147 Respiratory Rate (breaths/min): 18 Body Mass Index (BMI): 26 Blood Pressure (mmHg): 157/89 Reference Range: 80 - 120 mg / dl Electronic Signature(s) Signed: 04/19/2022 7:52:55 AM By: Massie Kluver Entered By: Massie Kluver on 04/18/2022 11:00:14

## 2022-04-25 ENCOUNTER — Encounter: Payer: Medicare HMO | Admitting: Physician Assistant

## 2022-04-25 DIAGNOSIS — I87333 Chronic venous hypertension (idiopathic) with ulcer and inflammation of bilateral lower extremity: Secondary | ICD-10-CM | POA: Diagnosis not present

## 2022-04-25 NOTE — Progress Notes (Signed)
DALYAH, PLA ([id number]) [id number] Page 1 of 9 Visit Report for 04/25/2022 Chief Complaint Document Details Patient Name: Date of Service: Brandy, Mccarthy. 04/25/2022 12:45 PM Medical Record Number: [id number] Patient Account Number: [id number] Date of Birth/Sex: Treating RN: 1950/10/16 (71 y.o. [name] Primary Care Provider: [name] Other Clinician: Massie Kluver Referring Provider: Treating Provider/Extender: [name] Weeks in Treatment: 20 Information Obtained from: Patient Chief Complaint Bilateral LE Ulcers Electronic Signature(s) Signed: 04/25/2022 12:49:15 PM By: Worthy Keeler [name]-C Entered By: Worthy Keeler on 04/25/2022 12:49:14 -------------------------------------------------------------------------------- Debridement Details Patient Name: Date of Service: [name] K. 04/25/2022 12:45 PM Medical Record Number: [id number] Patient Account Number: [id number] Date of Birth/Sex: Treating RN: 03-26-1951 (71 y.o. [name] Primary Care Provider: [name] Other Clinician: Massie Kluver Referring Provider: Treating Provider/Extender: [name] Weeks in Treatment: 20 Debridement Performed for Assessment: Wound #2 Right,Lateral Lower Leg Performed By: Physician Brandy Sams., [name]-C Debridement Type: Debridement Severity of Tissue Pre Debridement: Fat layer exposed Level of Consciousness (Pre-procedure): Awake and Alert Pre-procedure Verification/Time Out Yes - 13:06 Taken: Start Time: 13:06 T Area Debrided (L x W): otal 1 (cm) x 0.5 (cm) = 0.5 (cm) Tissue and other material debrided: Viable, Non-Viable, Slough, Subcutaneous, Slough Level: Skin/Subcutaneous Tissue Debridement Description: Excisional Instrument: Curette Bleeding: Minimum Hemostasis Achieved: Pressure Response to Treatment: Procedure was tolerated well Level of Consciousness (Post- Awake and  Alert procedure): Brandy, Mccarthy ([id number]) [id number] Page 2 of 9 Post Debridement Measurements of Total Wound Length: (cm) 1.5 Width: (cm) 0.5 Depth: (cm) 0.2 Volume: (cm) 0.118 Character of Wound/Ulcer Post Debridement: Stable Severity of Tissue Post Debridement: Fat layer exposed Post Procedure Diagnosis Same as Pre-procedure Electronic Signature(s) Signed: 04/25/2022 1:46:28 PM By: Gretta Cool, BSN, RN, CWS, [name] RN, BSN Signed: 04/26/2022 8:14:16 AM By: Worthy Keeler [name]-C Signed: 04/26/2022 5:00:42 PM By: Massie Kluver Entered By: Massie Kluver on 04/25/2022 13:09:08 -------------------------------------------------------------------------------- HPI Details Patient Name: Date of Service: Brandy Shiver Brandy K. 04/25/2022 12:45 PM Medical Record Number: [id number] Patient Account Number: [id number] Date of Birth/Sex: Treating RN: [birth year] (71 y.o. [name] Primary Care Provider: [name] Other Clinician: Massie Kluver Referring Provider: Treating Provider/Extender: [name] Weeks in Treatment: 20 History of Present Illness HPI Description: 11-30-2021 upon evaluation today patient presents for initial inspection here in our clinic concerning wounds that she has over the right lateral leg as well as the right medial leg. Subsequently the patient is dealing with lower extremity edema which I do believe has been one of the big causes of what is going on here. She also does have what appears to be cellulitis of the leg as well as peripheral vascular disease. She already has amputation of a toe on the right foot. With that being said I did review that surgical pathology from the amputation this was the right third toe which showed a gangrenous ulcer with underlying cellulitis negative for acute osteomyelitis but acute cellulitis was present in the proximal margin. There was also noted on this pathology report a biopsy from the right  leg which showed a gangrenous ulcer with underlying cellulitis and foreign body type giant cell reaction. That was performed on 11-11-2021 and resulted on 11-15-2021 She is also had lower extremity arterial duplex studies which did show that she had falsely elevated ABI on the right with a TBI of 0.45. On the left she had an ABI of 1.05 with a TBI of 0.12. She  is pretty much monophasic throughout. With that being said there was definite reason for concern with regard to her blood flow into the legs she does have a follow-up with the vascular surgeon coming up shortly as well. 12-14-2021 upon evaluation today patient appears to be doing decently well currently in regard to her wound. We are definitely seeing some signs of improvement there is definitely some necrotic tissue as well but try to clean some this away carefully today. Fortunately I think that she is going to tolerate the debridement without complication she otherwise seems to be healing excellent. I think get a lot of the dead tissue clearway will make a big difference as far as her healing is concerned. 12-21-2021 upon evaluation today patient appears to be doing well currently in regard to her legs she has been using the Hibiclens which she tells me seems to be helping. She tells me the leg feels much better washing with this compared to what it was previous which is great news. Fortunately I do not see any signs of active infection and I am overall extremely pleased with where we stand. I do think we are on the right track here. 01-04-2022 upon evaluation today patient appears to be doing unfortunately somewhat worse in regard to the new pustule areas that are appearing on her lower extremity. Again I have discussed with her based on what I am seeing that though she does have a wound I do not feel like these new pustule areas are related to the wound in particular. Again I am unsure of exactly what is going on and I really think she would  benefit from seeing a dermatology specialist. She voiced understanding and is not opposed to this she did see a dermatologist in [place] but they were under the impression they were just to do a biopsy which she had already had and therefore really did not do much she tells me that so I really know about that encounter. With that being said in general I do feel like that the patient is doing better although that is more in relation to the wound I think these new pustule areas still have me concerned. I do think she could be a candidate for topical antibiotics such as [employer] but at the same time I do think that we need to continue to monitor for any signs of overall worsening infection in the interim. 01-11-2022 upon evaluation today patient appears to be doing well currently in regard to her wounds nothing seems to be doing worse although she continues to have issues with multiple pustules forming I am really not certain that this is actually infection. I am beginning to suspect something rheumatologic/autoimmune. With that being said she did see dermatology today and it was felt by the dermatologist that this is likely infectious despite the fact we had a negative culture from when I saw her last week. They did repeat a culture on one of the pustules. 01-25-2022 upon evaluation today patient appears to be doing better currently in regard to her wounds in general. Everything is showing signs of improvement which is great news. Fortunately I see no evidence of active infection locally or systemically at this time which is great news. She does have a new skin tear Brandy, Mccarthy ([id number]) [id number] Page 3 of 9 on her forearm but this she did try to pull back over and has done quite well. 02-08-2022 upon evaluation today patient appears to be doing excellent currently in regard  to her wound. She has been tolerating the dressing changes without complication and again she  still seems to be making progress. Despite this she still has a lot of blisters that keep popping up again I have cultured them and they have been cultured twice other once at dermatology once I believe with primary care never have we identified any causative organisms. I am beginning to believe in fact have made a referral to Dr. Posey Pronto, rheumatology, in order to see if there is some other type of inflammatory process they were dealing with here. I think that this is becoming increasingly likely. 02-21-2022 upon evaluation today patient appears to be doing well currently in regard to her wounds. In fact everything is showing signs of improvement which is good news she has found some compression that she was interested in that seems to have been doing extremely well at this point. I feel like that may be part of what is improving the here overall. 03-07-2022 upon evaluation today patient actually is making some good progress here in regard to her right leg I am very pleased in this regard. With that being said she does have a wound on her left leg which does appear to be a venous leg ulcer that occurred as a result of a blister that arose and then burst over the past week. Fortunately there does not appear to be any evidence of active infection at this time which is great news. No fevers, chills, nausea, vomiting, or diarrhea. 03-21-2022 upon evaluation patient's wound is actually showing signs of significant improvement which is great news and overall I am extremely pleased with where we stand today. I do not see any evidence of infection locally or systemically which is great news. No fevers, chills, nausea, vomiting, or diarrhea. 04-04-2022 upon evaluation today patient appears to be doing better in regard to her right lateral ulcer. The anterior ulcer on the leg is also doing better. She still has several of these pustules that come and go but again they are not significant compared to last time I saw  her even. I do believe the clobetasol that was given by dermatology has been of benefit as well and this is great news. 04-19-2022 upon evaluation today patient appears to be doing well actually in regard to her wounds. She has been tolerating the dressing changes without complication every time I see her things actually seem to be getting better which is great news. Fortunately I do not see any evidence of active infection locally nor systemically at this time which is great news. No fevers, chills, nausea, vomiting, or diarrhea. 04-25-2022 upon evaluation today patient appears to be doing well currently in regard to her wounds. She is tolerating the dressing changes without complication. Fortunately there does not appear to be any signs of infection which is great news and overall I think she is making great progress. Electronic Signature(s) Signed: 04/25/2022 1:28:57 PM By: Worthy Keeler [name]-C Entered By: Worthy Keeler on 04/25/2022 13:28:56 -------------------------------------------------------------------------------- Physical Exam Details Patient Name: Date of Service: Brandy, OVITT Brandy K. 04/25/2022 12:45 PM Medical Record Number: [id number] Patient Account Number: [id number] Date of Birth/Sex: Treating RN: 1950-11-21 (71 y.o. [name] Primary Care Provider: [name] Other Clinician: Massie Kluver Referring Provider: Treating Provider/Extender: [name] Weeks in Treatment: 73 Constitutional Well-nourished and well-hydrated in no acute distress. Respiratory normal breathing without difficulty. Psychiatric this patient is able to make decisions and demonstrates good insight into disease process. Alert  and Oriented x 3. pleasant and cooperative. Notes Patient's wound again did require sharp debridement clearway some of the necrotic debris she tolerated that without complication and postdebridement the wound bed appears to be doing significantly better which  is excellent news. No fevers, chills, nausea, vomiting, or diarrhea. Electronic Signature(s) Signed: 04/25/2022 1:29:09 PM By: Worthy Keeler [name]-C Entered By: Worthy Keeler on 04/25/2022 13:29:08 Arteaga, [name] ([id number]) [id number] Page 4 of 9 -------------------------------------------------------------------------------- Physician Orders Details Patient Name: Date of Service: Brandy, Mccarthy 04/25/2022 12:45 PM Medical Record Number: [id number] Patient Account Number: [id number] Date of Birth/Sex: Treating RN: [birth year] (71 y.o. [name], [name] Primary Care Provider: [name] Other Clinician: Massie Kluver Referring Provider: Treating Provider/Extender: [name] Weeks in Treatment: 6 Verbal / Phone Orders: No Diagnosis Coding ICD-10 Coding Code Description [phone] Chronic venous hypertension (idiopathic) with ulcer and inflammation of bilateral lower extremity L97.812 Non-pressure chronic ulcer of other part of right lower leg with fat layer exposed L03.115 Cellulitis of right lower limb S51.812A Laceration without foreign body of left forearm, initial encounter L97.822 Non-pressure chronic ulcer of other part of left lower leg with fat layer exposed I73.89 Other specified peripheral vascular diseases Z89.421 Acquired absence of other right toe(s) Follow-up Appointments Return Appointment in 1 week. Bathing/ Shower/ Hygiene May shower; gently cleanse wound with antibacterial soap, rinse and pat dry prior to dressing wounds - Hibiclens recommended for washing right lower leg prior to dressing changes. You may pick this up at a pharmacy or purchase on [facility] No tub bath. Edema Control - Lymphedema / Segmental Compressive Device / Other Tubigrip double layer applied - Tubi D double layer Elevate, Exercise Daily and A void Standing for Long Periods of Time. Elevate legs to the level of the heart and pump ankles as often as  possible Elevate leg(s) parallel to the floor when sitting. Wound Treatment Wound #2 - Lower Leg Wound Laterality: Right, Lateral Cleanser: Hibiclens, 16 (oz) 3 x Per Week/30 Days Discharge Instructions: Put Hibiclens on your skin and rub it in gently for five minutes with a washcloth. Turn the water back on and rinse very well with warm water. Do not use your regular soap after using and rinsing Hibiclens. Pat yourself dry with a clean towel. Topical: Clobetasol Propionate ointment 0.05%, 60 (g) tube 3 x Per Week/30 Days Discharge Instructions: Apply as directed. Given by Dermatologist Prim Dressing: Silvercel 4 1/4x 4 1/4 (in/in) 3 x Per Week/30 Days ary Discharge Instructions: Apply Silvercel 4 1/4x 4 1/4 (in/in) as instructed Secondary Dressing: ABD Pad 5x9 (in/in) 3 x Per Week/30 Days Discharge Instructions: Cover with ABD pad Secured With: Medipore T - 61M Medipore H Soft Cloth Surgical T ape ape, 2x2 (in/yd) 3 x Per Week/30 Days Secured With: Conform 4'' - Conforming Stretch Gauze Bandage 4x75 (in/in) 3 x Per Week/30 Days Discharge Instructions: Apply as directed Secured With: Tubigrip Size D, 3x10 (in/yd) 3 x Per Week/30 Days Electronic Signature(s) Signed: 04/26/2022 8:14:16 AM By: Worthy Keeler [name]-C Signed: 04/26/2022 5:00:42 PM By: Massie Kluver [place],Signed: 04/26/2022 5:00:42 PM By: Augustina Mood ([id number]) [id number] Page 5 of 9 Entered By: Massie Kluver on 04/25/2022 13:09:50 -------------------------------------------------------------------------------- Problem List Details Patient Name: Date of Service: Brandy, Mccarthy. 04/25/2022 12:45 PM Medical Record Number: [id number] Patient Account Number: [id number] Date of Birth/Sex: Treating RN: 10-Jan-1951 (71 y.o. [name] Primary Care Provider: [name] Other Clinician: Massie Kluver Referring Provider: Treating Provider/Extender: Buckner [place],  KEUNG Weeks in  Treatment: 20 Active Problems ICD-10 Encounter Code Description Active Date MDM Diagnosis I87.333 Chronic venous hypertension (idiopathic) with ulcer and inflammation of 11/30/2021 No Yes bilateral lower extremity L97.812 Non-pressure chronic ulcer of other part of right lower leg with fat layer 11/30/2021 No Yes exposed L03.115 Cellulitis of right lower limb 11/30/2021 No Yes S51.812A Laceration without foreign body of left forearm, initial encounter 01/25/2022 No Yes L97.822 Non-pressure chronic ulcer of other part of left lower leg with fat layer exposed10/07/2021 No Yes I73.89 Other specified peripheral vascular diseases 11/30/2021 No Yes Z89.421 Acquired absence of other right toe(s) 11/30/2021 No Yes Inactive Problems Resolved Problems Electronic Signature(s) Signed: 04/25/2022 12:49:03 PM By: Worthy Keeler [name]-C Entered By: Worthy Keeler on 04/25/2022 12:49:03 Liggins, [name] ([id number]) [id number] Page 6 of 9 -------------------------------------------------------------------------------- Progress Note Details Patient Name: Date of Service: Brandy, Mccarthy 04/25/2022 12:45 PM Medical Record Number: [id number] Patient Account Number: [id number] Date of Birth/Sex: Treating RN: Apr 03, 1951 (71 y.o. [name] Primary Care Provider: [name] Other Clinician: Massie Kluver Referring Provider: Treating Provider/Extender: [name] Weeks in Treatment: 20 Subjective Chief Complaint Information obtained from Patient Bilateral LE Ulcers History of Present Illness (HPI) 11-30-2021 upon evaluation today patient presents for initial inspection here in our clinic concerning wounds that she has over the right lateral leg as well as the right medial leg. Subsequently the patient is dealing with lower extremity edema which I do believe has been one of the big causes of what is going on here. She also does have what appears to be cellulitis  of the leg as well as peripheral vascular disease. She already has amputation of a toe on the right foot. With that being said I did review that surgical pathology from the amputation this was the right third toe which showed a gangrenous ulcer with underlying cellulitis negative for acute osteomyelitis but acute cellulitis was present in the proximal margin. There was also noted on this pathology report a biopsy from the right leg which showed a gangrenous ulcer with underlying cellulitis and foreign body type giant cell reaction. That was performed on 11-11-2021 and resulted on 11-15-2021 She is also had lower extremity arterial duplex studies which did show that she had falsely elevated ABI on the right with a TBI of 0.45. On the left she had an ABI of 1.05 with a TBI of 0.12. She is pretty much monophasic throughout. With that being said there was definite reason for concern with regard to her blood flow into the legs she does have a follow-up with the vascular surgeon coming up shortly as well. 12-14-2021 upon evaluation today patient appears to be doing decently well currently in regard to her wound. We are definitely seeing some signs of improvement there is definitely some necrotic tissue as well but try to clean some this away carefully today. Fortunately I think that she is going to tolerate the debridement without complication she otherwise seems to be healing excellent. I think get a lot of the dead tissue clearway will make a big difference as far as her healing is concerned. 12-21-2021 upon evaluation today patient appears to be doing well currently in regard to her legs she has been using the Hibiclens which she tells me seems to be helping. She tells me the leg feels much better washing with this compared to what it was previous which is great news. Fortunately I do not see any signs of active infection and  I am overall extremely pleased with where we stand. I do think we are on the right  track here. 01-04-2022 upon evaluation today patient appears to be doing unfortunately somewhat worse in regard to the new pustule areas that are appearing on her lower extremity. Again I have discussed with her based on what I am seeing that though she does have a wound I do not feel like these new pustule areas are related to the wound in particular. Again I am unsure of exactly what is going on and I really think she would benefit from seeing a dermatology specialist. She voiced understanding and is not opposed to this she did see a dermatologist in [place] but they were under the impression they were just to do a biopsy which she had already had and therefore really did not do much she tells me that so I really know about that encounter. With that being said in general I do feel like that the patient is doing better although that is more in relation to the wound I think these new pustule areas still have me concerned. I do think she could be a candidate for topical antibiotics such as [employer] but at the same time I do think that we need to continue to monitor for any signs of overall worsening infection in the interim. 01-11-2022 upon evaluation today patient appears to be doing well currently in regard to her wounds nothing seems to be doing worse although she continues to have issues with multiple pustules forming I am really not certain that this is actually infection. I am beginning to suspect something rheumatologic/autoimmune. With that being said she did see dermatology today and it was felt by the dermatologist that this is likely infectious despite the fact we had a negative culture from when I saw her last week. They did repeat a culture on one of the pustules. 01-25-2022 upon evaluation today patient appears to be doing better currently in regard to her wounds in general. Everything is showing signs of improvement which is great news. Fortunately I see no evidence of active infection  locally or systemically at this time which is great news. She does have a new skin tear on her forearm but this she did try to pull back over and has done quite well. 02-08-2022 upon evaluation today patient appears to be doing excellent currently in regard to her wound. She has been tolerating the dressing changes without complication and again she still seems to be making progress. Despite this she still has a lot of blisters that keep popping up again I have cultured them and they have been cultured twice other once at dermatology once I believe with primary care never have we identified any causative organisms. I am beginning to believe in fact have made a referral to Dr. Posey Pronto, rheumatology, in order to see if there is some other type of inflammatory process they were dealing with here. I think that this is becoming increasingly likely. 02-21-2022 upon evaluation today patient appears to be doing well currently in regard to her wounds. In fact everything is showing signs of improvement which is good news she has found some compression that she was interested in that seems to have been doing extremely well at this point. I feel like that may be part of what is improving the here overall. 03-07-2022 upon evaluation today patient actually is making some good progress here in regard to her right leg I am very pleased in this regard. With  that being said she does have a wound on her left leg which does appear to be a venous leg ulcer that occurred as a result of a blister that arose and then burst over the past week. Fortunately there does not appear to be any evidence of active infection at this time which is great news. No fevers, chills, nausea, vomiting, or diarrhea. 03-21-2022 upon evaluation patient's wound is actually showing signs of significant improvement which is great news and overall I am extremely pleased with where we stand today. I do not see any evidence of infection locally or  systemically which is great news. No fevers, chills, nausea, vomiting, or diarrhea. Brandy, Mccarthy ([id number]) [id number] Page 7 of 9 04-04-2022 upon evaluation today patient appears to be doing better in regard to her right lateral ulcer. The anterior ulcer on the leg is also doing better. She still has several of these pustules that come and go but again they are not significant compared to last time I saw her even. I do believe the clobetasol that was given by dermatology has been of benefit as well and this is great news. 04-19-2022 upon evaluation today patient appears to be doing well actually in regard to her wounds. She has been tolerating the dressing changes without complication every time I see her things actually seem to be getting better which is great news. Fortunately I do not see any evidence of active infection locally nor systemically at this time which is great news. No fevers, chills, nausea, vomiting, or diarrhea. 04-25-2022 upon evaluation today patient appears to be doing well currently in regard to her wounds. She is tolerating the dressing changes without complication. Fortunately there does not appear to be any signs of infection which is great news and overall I think she is making great progress. Objective Constitutional Well-nourished and well-hydrated in no acute distress. Vitals Time Taken: 12:48 PM, Height: 63 in, Weight: 147 lbs, BMI: 26, Temperature: 98.4 F, Pulse: 86 bpm, Respiratory Rate: 16 breaths/min, Blood Pressure: 130/82 mmHg. Respiratory normal breathing without difficulty. Psychiatric this patient is able to make decisions and demonstrates good insight into disease process. Alert and Oriented x 3. pleasant and cooperative. General Notes: Patient's wound again did require sharp debridement clearway some of the necrotic debris she tolerated that without complication and postdebridement the wound bed appears to be doing  significantly better which is excellent news. No fevers, chills, nausea, vomiting, or diarrhea. Integumentary (Hair, Skin) Wound #2 status is Open. Original cause of wound was Gradually Appeared. The date acquired was: 07/08/2021. The wound has been in treatment 20 weeks. The wound is located on the Right,Lateral Lower Leg. The wound measures 1.4cm length x 0.5cm width x 0.2cm depth; 0.55cm^2 area and 0.11cm^3 volume. There is Fat Layer (Subcutaneous Tissue) exposed. There is a medium amount of serosanguineous drainage noted. There is large (67-100%) red granulation within the wound bed. There is a small (1-33%) amount of necrotic tissue within the wound bed including Adherent Slough. Assessment Active Problems ICD-10 Chronic venous hypertension (idiopathic) with ulcer and inflammation of bilateral lower extremity Non-pressure chronic ulcer of other part of right lower leg with fat layer exposed Cellulitis of right lower limb Laceration without foreign body of left forearm, initial encounter Non-pressure chronic ulcer of other part of left lower leg with fat layer exposed Other specified peripheral vascular diseases Acquired absence of other right toe(s) Procedures Wound #2 Pre-procedure diagnosis of Wound #2 is a Venous Leg Ulcer located on the Right,Lateral  Lower Leg .Severity of Tissue Pre Debridement is: Fat layer exposed. There was a Excisional Skin/Subcutaneous Tissue Debridement with a total area of 0.5 sq cm performed by Brandy Sams., [name]-C. With the following instrument(s): Curette to remove Viable and Non-Viable tissue/material. Material removed includes Subcutaneous Tissue and Slough and. A time out was conducted at 13:06, prior to the start of the procedure. A Minimum amount of bleeding was controlled with Pressure. The procedure was tolerated well. Post Debridement Measurements: 1.5cm length x 0.5cm width x 0.2cm depth; 0.118cm^3 volume. Character of Wound/Ulcer Post Debridement is  stable. Severity of Tissue Post Debridement is: Fat layer exposed. Post procedure Diagnosis Wound #2: Same as Pre-Procedure Plan Follow-up Appointments: Return Appointment in 1 week. Bathing/ Shower/ Hygiene: May shower; gently cleanse wound with antibacterial soap, rinse and pat dry prior to dressing wounds - Hibiclens recommended for washing right lower leg prior Brandy, Mccarthy ([id number]) [phone].pdf Page 8 of 9 to dressing changes. You may pick this up at a pharmacy or purchase on [employer] No tub bath. Edema Control - Lymphedema / Segmental Compressive Device / Other: Tubigrip double layer applied - Tubi D double layer Elevate, Exercise Daily and Avoid Standing for Long Periods of Time. Elevate legs to the level of the heart and pump ankles as often as possible Elevate leg(s) parallel to the floor when sitting. WOUND #2: - Lower Leg Wound Laterality: Right, Lateral Cleanser: Hibiclens, 16 (oz) 3 x Per Week/30 Days Discharge Instructions: Put Hibiclens on your skin and rub it in gently for five minutes with a washcloth. Turn the water back on and rinse very well with warm water. Do not use your regular soap after using and rinsing Hibiclens. Pat yourself dry with a clean towel. Topical: Clobetasol Propionate ointment 0.05%, 60 (g) tube 3 x Per Week/30 Days Discharge Instructions: Apply as directed. Given by Dermatologist Prim Dressing: Silvercel 4 1/4x 4 1/4 (in/in) 3 x Per Week/30 Days ary Discharge Instructions: Apply Silvercel 4 1/4x 4 1/4 (in/in) as instructed Secondary Dressing: ABD Pad 5x9 (in/in) 3 x Per Week/30 Days Discharge Instructions: Cover with ABD pad Secured With: Medipore T - 80M Medipore H Soft Cloth Surgical T ape ape, 2x2 (in/yd) 3 x Per Week/30 Days Secured With: Conform 4'' - Conforming Stretch Gauze Bandage 4x75 (in/in) 3 x Per Week/30 Days Discharge Instructions: Apply as directed Secured With: Tubigrip Size D, 3x10 (in/yd) 3 x  Per Week/30 Days 1. I am going to suggest that we have the patient continue with the silver alginate dressing which I think is doing a good job here. 2. I am also can recommend that we have her continue to monitor for any signs of worsening infection if anything changes she should let me know sooner rather than later. We will see patient back for reevaluation in 1 week here in the clinic. If anything worsens or changes patient will contact our office for additional recommendations. Electronic Signature(s) Signed: 04/25/2022 1:29:48 PM By: Worthy Keeler [name]-C Entered By: Worthy Keeler on 04/25/2022 13:29:47 -------------------------------------------------------------------------------- SuperBill Details Patient Name: Date of Service: Brandy Shiver Brandy K. 04/25/2022 Medical Record Number: [id number] Patient Account Number: [id number] Date of Birth/Sex: Treating RN: March 24, 1951 (71 y.o. [name] Primary Care Provider: [name] Other Clinician: Massie Kluver Referring Provider: Treating Provider/Extender: [name] Weeks in Treatment: 20 Diagnosis Coding ICD-10 Codes Code Description [phone] Chronic venous hypertension (idiopathic) with ulcer and inflammation of bilateral lower extremity L97.812 Non-pressure chronic ulcer of other part of  right lower leg with fat layer exposed L03.115 Cellulitis of right lower limb S51.812A Laceration without foreign body of left forearm, initial encounter L97.822 Non-pressure chronic ulcer of other part of left lower leg with fat layer exposed I73.89 Other specified peripheral vascular diseases Z89.421 Acquired absence of other right toe(s) Facility Procedures : Brandy, Mccarthy Code: [id number] Brandy K ([id number] Description: 11042 - DEB SUBQ TISSUE 20 SQ CM/< ICD-10 Diagnosis Description V67.014 Non-pressure chronic ulcer of other part of right lower leg with fat layer expo 0) [phone] Modifier: sed [id number]  P Quantity: 1 age 76 of 59 Physician Procedures : CPT4 Code Description Modifier [id number] 15615 - WC PHYS SUBQ TISS 20 SQ CM ICD-10 Diagnosis Description P79.432 Non-pressure chronic ulcer of other part of right lower leg with fat layer exposed Quantity: 1 Electronic Signature(s) Signed: 04/25/2022 1:30:39 PM By: Worthy Keeler [name]-C Entered By: Worthy Keeler on 04/25/2022 13:30:39

## 2022-04-26 NOTE — Progress Notes (Signed)
[name], [name] ([id number]) [id number] Page 1 of 8 Visit Report for 04/25/2022 Arrival Information Details Patient Name: Date of Service: [name], GALI. 04/25/2022 12:45 PM Medical Record Number: [id number] Patient Account Number: [id number] Date of Birth/Sex: Treating RN: 1951-02-10 (71 y.o. [name] Primary Care [name]: [name] Other Clinician: [name] Referring [name]: Treating [name]/Extender: [name] Weeks in Treatment: 8 Visit Information History Since Last Visit All ordered tests and consults were completed: No Patient Arrived: Ambulatory Added or deleted any medications: No Arrival Time: 12:47 Any new allergies or adverse reactions: No Transfer Assistance: None Had a fall or experienced change in No Patient Identification Verified: Yes activities of daily living that may affect Secondary Verification Process Completed: Yes risk of falls: Patient Requires Transmission-Based Precautions: No Signs or symptoms of abuse/neglect since last visito No Patient Has Alerts: Yes Hospitalized since last visit: No Patient Alerts: Patient on Blood Thinner Implantable device outside of the clinic excluding No cellular tissue based products placed in the center since last visit: Has Dressing in Place as Prescribed: Yes Pain Present Now: No Electronic Signature(s) Signed: 04/26/2022 5:00:42 PM By: [name] Entered By: [name] on 04/25/2022 12:48:13 -------------------------------------------------------------------------------- Clinic Level of Care Assessment Details Patient Name: Date of Service: [name], [name] 04/25/2022 12:45 PM Medical Record Number: [id number] Patient Account Number: [id number] Date of Birth/Sex: Treating RN: June 17, 1950 (71 y.o. [name] Primary Care [name]: [name] Other Clinician: [name] Referring [name]: Treating [name]/Extender: [name] Weeks in Treatment: 20 Clinic Level of Care Assessment Items TOOL 1 Quantity Score '[]'$  - 0 Use when EandM and Procedure is performed on INITIAL visit ASSESSMENTS - Nursing Assessment / Reassessment '[]'$  - 0 General Physical Exam (combine w/ comprehensive assessment (listed just below) when performed on new pt. evals) '[]'$  - 0 Comprehensive Assessment (HX, ROS, Risk Assessments, Wounds Hx, etc.) [name], [name] ([id number]) [id number] Page 2 of 8 ASSESSMENTS - Wound and Skin Assessment / Reassessment '[]'$  - 0 Dermatologic / Skin Assessment (not related to wound area) ASSESSMENTS - Ostomy and/or Continence Assessment and Care '[]'$  - 0 Incontinence Assessment and Management '[]'$  - 0 Ostomy Care Assessment and Management (repouching, etc.) PROCESS - Coordination of Care '[]'$  - 0 Simple Patient / Family Education for ongoing care '[]'$  - 0 Complex (extensive) Patient / Family Education for ongoing care '[]'$  - 0 Staff obtains Programmer, systems, Records, T Results / Process Orders est '[]'$  - 0 Staff telephones HHA, Nursing Homes / Clarify orders / etc '[]'$  - 0 Routine Transfer to another Facility (non-emergent condition) '[]'$  - 0 Routine Hospital Admission (non-emergent condition) '[]'$  - 0 New Admissions / Biomedical engineer / Ordering NPWT Apligraf, etc. , '[]'$  - 0 Emergency Hospital Admission (emergent condition) PROCESS - Special Needs '[]'$  - 0 Pediatric / Minor Patient Management '[]'$  - 0 Isolation Patient Management '[]'$  - 0 Hearing / Language / Visual special needs '[]'$  - 0 Assessment of Community assistance (transportation, D/C planning, etc.) '[]'$  - 0 Additional assistance / Altered mentation '[]'$  - 0 Support Surface(s) Assessment (bed, cushion, seat, etc.) INTERVENTIONS - Miscellaneous '[]'$  - 0 External ear exam '[]'$  - 0 Patient Transfer (multiple staff / Civil Service fast streamer / Similar devices) '[]'$  - 0 Simple Staple / Suture removal (25 or less) '[]'$  - 0 Complex Staple / Suture  removal (26 or more) '[]'$  - 0 Hypo/Hyperglycemic Management (do not check if billed separately) '[]'$  - 0 Ankle / Brachial Index (ABI) - do not check if  billed separately Has the patient been seen at the hospital within the last three years: Yes Total Score: 0 Level Of Care: ____ Electronic Signature(s) Signed: 04/26/2022 5:00:42 PM By: [name] Entered By: [name] on 04/25/2022 13:09:57 -------------------------------------------------------------------------------- Encounter Discharge Information Details Patient Name: Date of Service: [name] [place] Mccarthy. 04/25/2022 12:45 PM Medical Record Number: [id number] Patient Account Number: [id number] Date of Birth/Sex: Treating RN: 08-Nov-1950 (71 y.o. [name] Primary Care [name]: [name] Other Clinician: [name] Referring [name]: Treating [name]/Extender: [name] [name], [name] ([id number]) [id number] Page 3 of 8 Weeks in Treatment: 20 Encounter Discharge Information Items Post Procedure Vitals Discharge Condition: Stable Temperature (F): 98.4 Ambulatory Status: Ambulatory Pulse (bpm): 86 Discharge Destination: Home Respiratory Rate (breaths/min): 16 Transportation: Private Auto Blood Pressure (mmHg): 130/82 Accompanied By: self Schedule Follow-up Appointment: Yes Clinical Summary of Care: Electronic Signature(s) Signed: 04/26/2022 5:00:42 PM By: [name] Entered By: [name] on 04/25/2022 13:21:32 -------------------------------------------------------------------------------- Lower Extremity Assessment Details Patient Name: Date of Service: [name], Brandy [name]. 04/25/2022 12:45 PM Medical Record Number: [id number] Patient Account Number: [id number] Date of Birth/Sex: Treating RN: 1951/03/30 (71 y.o. [name] Primary Care Malashia Kamaka: [name] Other Clinician: [name] Referring Miracle Criado: Treating Eleny Cortez/Extender: [name] Weeks in Treatment: 20 Edema Assessment Assessed: [Left: No] [Right: Yes] Edema: [Left: Ye] [Right: s] Calf Left: Right: Point of Measurement: 32 cm From Medial Instep 29 cm Ankle Left: Right: Point of Measurement: 10 cm From Medial Instep 21 cm Vascular Assessment Pulses: Dorsalis Pedis Palpable: [Right:Yes] Electronic Signature(s) Signed: 04/25/2022 1:46:28 PM By: Gretta Cool, BSN, RN, CWS, [name] RN, BSN Signed: 04/26/2022 5:00:42 PM By: [name] Entered By: [name] on 04/25/2022 12:56:51 [place], [name] ([id number]) [id number] Page 4 of 8 -------------------------------------------------------------------------------- Multi Wound Chart Details Patient Name: Date of Service: YEZENIA, FREDRICK. 04/25/2022 12:45 PM Medical Record Number: [id number] Patient Account Number: [id number] Date of Birth/Sex: Treating RN: 05-24-1951 (71 y.o. [name] Primary Care Indy Prestwood: [name] Other Clinician: [name] Referring Melody Savidge: Treating Olvin Rohr/Extender: [name] Weeks in Treatment: 20 Vital Signs Height(in): 63 Pulse(bpm): 86 Weight(lbs): 147 Blood Pressure(mmHg): 130/82 Body Mass Index(BMI): 26 Temperature(F): 98.4 Respiratory Rate(breaths/min): 16 [2:Photos:] [N/A:N/A] Right, Lateral Lower Leg N/A N/A Wound Location: Gradually Appeared N/A N/A Wounding Event: Venous Leg Ulcer N/A N/A Primary Etiology: Hypertension, Peripheral Arterial N/A N/A Comorbid History: Disease 07/08/2021 N/A N/A Date Acquired: 20 N/A N/A Weeks of Treatment: Open N/A N/A Wound Status: No N/A N/A Wound Recurrence: 1.4x0.5x0.2 N/A N/A Measurements L x W x D (cm) 0.55 N/A N/A A (cm) : rea 0.11 N/A N/A Volume (cm) : 93.70% N/A N/A % Reduction in Area: 96.90% N/A N/A % Reduction in Volume: Full Thickness Without Exposed N/A N/A Classification: Support Structures Medium N/A N/A Exudate Amount: Serosanguineous N/A  N/A Exudate Type: red, brown N/A N/A Exudate Color: Large (67-100%) N/A N/A Granulation Amount: Red N/A N/A Granulation Quality: Small (1-33%) N/A N/A Necrotic Amount: Fat Layer (Subcutaneous Tissue): Yes N/A N/A Exposed Structures: Fascia: No Tendon: No Muscle: No Joint: No Bone: No None N/A N/A Epithelialization: Treatment Notes Electronic Signature(s) Signed: 04/26/2022 5:00:42 PM By: [name] Entered By: [name] on 04/25/2022 12:57:14 [place], [name] ([id number]) [id number] Page 5 of 8 -------------------------------------------------------------------------------- Multi-Disciplinary Care Plan Details Patient Name: Date of Service: [name], CATINO. 04/25/2022 12:45 PM Medical Record Number: [id number] Patient Account Number: [id number] Date of Birth/Sex: Treating RN: 1950/09/14 (71 y.o. [name], Maudie Mercury  Primary Care Briggett Tuccillo: [name] Other Clinician: [name] Referring Wren Gallaga: Treating Rukiya Hodgkins/Extender: [name] Weeks in Treatment: 20 Active Inactive Wound/Skin Impairment Nursing Diagnoses: Knowledge deficit related to ulceration/compromised skin integrity Goals: Patient/caregiver will verbalize understanding of skin care regimen Date Initiated: 11/30/2021 Target Resolution Date: 12/30/2021 Goal Status: Active Ulcer/skin breakdown will have a volume reduction of 30% by week 4 Date Initiated: 11/30/2021 Target Resolution Date: 01/30/2022 Goal Status: Active Ulcer/skin breakdown will have a volume reduction of 50% by week 8 Date Initiated: 11/30/2021 Target Resolution Date: 03/02/2022 Goal Status: Active Ulcer/skin breakdown will have a volume reduction of 80% by week 12 Date Initiated: 11/30/2021 Target Resolution Date: 04/01/2022 Goal Status: Active Ulcer/skin breakdown will heal within 14 weeks Date Initiated: 11/30/2021 Target Resolution Date: 05/02/2022 Goal Status:  Active Interventions: Assess patient/caregiver ability to obtain necessary supplies Assess patient/caregiver ability to perform ulcer/skin care regimen upon admission and as needed Assess ulceration(s) every visit Notes: Electronic Signature(s) Signed: 04/25/2022 1:46:28 PM By: Gretta Cool, BSN, RN, CWS, [name] RN, BSN Signed: 04/26/2022 5:00:42 PM By: [name] Entered By: [name] on 04/25/2022 12:56:58 -------------------------------------------------------------------------------- Pain Assessment Details Patient Name: Date of Service: [name], Brandy [name]. 04/25/2022 12:45 PM [name] ([id number]) [id number] Page 6 of 8 Medical Record Number: [id number] Patient Account Number: [id number] Date of Birth/Sex: Treating RN: 01/21/1951 (71 y.o. [name] Primary Care Taliah Porche: [name] Other Clinician: [name] Referring Kendrik Mcshan: Treating Teckla Christiansen/Extender: [name] Weeks in Treatment: 20 Active Problems Location of Pain Severity and Description of Pain Patient Has Paino No Site Locations Pain Management and Medication Current Pain Management: Electronic Signature(s) Signed: 04/25/2022 1:46:28 PM By: Gretta Cool, BSN, RN, CWS, [name] RN, BSN Signed: 04/26/2022 5:00:42 PM By: [name] Entered By: [name] on 04/25/2022 12:50:22 -------------------------------------------------------------------------------- Patient/Caregiver Education Details Patient Name: Date of Service: [name] [name]. 11/20/2023andnbsp12:45 PM Medical Record Number: [id number] Patient Account Number: [id number] Date of Birth/Gender: Treating RN: 01-31-1951 (71 y.o. [name] Primary Care Physician: [name] Other Clinician: [name] Referring Physician: Treating Physician/Extender: [name] Weeks in Treatment: 20 Education Assessment Education Provided To: Patient Education Topics Provided Wound/Skin  Impairment: Handouts: Other: continue wound care as directed Methods: Explain/Verbal Responses: State content correctly ELNER, SEIFERT Mccarthy ([id number]) [id number] Page 7 of 8 Electronic Signature(s) Signed: 04/26/2022 5:00:42 PM By: [name] Entered By: [name] on 04/25/2022 13:10:25 -------------------------------------------------------------------------------- Wound Assessment Details Patient Name: Date of Service: [name], [name] [place] Mccarthy. 04/25/2022 12:45 PM Medical Record Number: [id number] Patient Account Number: [id number] Date of Birth/Sex: Treating RN: 1950/08/14 (71 y.o. [name], [name] Primary Care Richardson Dubree: [name] Other Clinician: [name] Referring Jaxsin Bottomley: Treating Wendell Fiebig/Extender: [name] Weeks in Treatment: 20 Wound Status Wound Number: 2 Primary Etiology: Venous Leg Ulcer Wound Location: Right, Lateral Lower Leg Wound Status: Open Wounding Event: Gradually Appeared Comorbid History: Hypertension, Peripheral Arterial Disease Date Acquired: 07/08/2021 Weeks Of Treatment: 20 Clustered Wound: No Photos Wound Measurements Length: (cm) 1.4 Width: (cm) 0.5 Depth: (cm) 0.2 Area: (cm) 0.55 Volume: (cm) 0.11 % Reduction in Area: 93.7% % Reduction in Volume: 96.9% Epithelialization: None Wound Description Classification: Full Thickness Without Exposed Support Structures Exudate Amount: Medium Exudate Type: Serosanguineous Exudate Color: red, brown Foul Odor After Cleansing: No Slough/Fibrino Yes Wound Bed Granulation Amount: Large (67-100%) Exposed Structure Granulation Quality: Red Fascia Exposed: No Necrotic Amount: Small (1-33%) Fat Layer (Subcutaneous Tissue) Exposed: Yes Necrotic Quality: Adherent Slough Tendon Exposed: No Muscle Exposed: No Joint Exposed: No  Bone Exposed: No Treatment Notes JULIAN, ASKIN ([id number]) [id number] Page 8 of 8 Wound #2 (Lower  Leg) Wound Laterality: Right, Lateral Cleanser Hibiclens, 16 (oz) Discharge Instruction: Put Hibiclens on your skin and rub it in gently for five minutes with a washcloth. Turn the water back on and rinse very well with warm water. Do not use your regular soap after using and rinsing Hibiclens. Pat yourself dry with a clean towel. Peri-Wound Care Topical Clobetasol Propionate ointment 0.05%, 60 (g) tube Discharge Instruction: Apply as directed. Given by Dermatologist Primary Dressing Silvercel 4 1/4x 4 1/4 (in/in) Discharge Instruction: Apply Silvercel 4 1/4x 4 1/4 (in/in) as instructed Secondary Dressing ABD Pad 5x9 (in/in) Discharge Instruction: Cover with ABD pad Secured With [place] H Soft Cloth Surgical T ape ape, 2x2 (in/yd) Conform 4'' - Conforming Stretch Gauze Bandage 4x75 (in/in) Discharge Instruction: Apply as directed Tubigrip Size D, 3x10 (in/yd) Compression Wrap Compression Stockings Add-Ons Electronic Signature(s) Signed: 04/25/2022 1:46:28 PM By: Gretta Cool, BSN, RN, CWS, [name] RN, BSN Signed: 04/26/2022 5:00:42 PM By: [name] Entered By: [name] on 04/25/2022 12:55:51 -------------------------------------------------------------------------------- Vitals Details Patient Name: Date of Service: [name] [place] Mccarthy. 04/25/2022 12:45 PM Medical Record Number: [id number] Patient Account Number: [id number] Date of Birth/Sex: Treating RN: 22-May-1951 (71 y.o. [name], [name] Primary Care Cason Luffman: [name] Other Clinician: [name] Referring Lasalle Abee: Treating Yannely Kintzel/Extender: [name] Weeks in Treatment: 20 Vital Signs Time Taken: 12:48 Temperature (F): 98.4 Height (in): 63 Pulse (bpm): 86 Weight (lbs): 147 Respiratory Rate (breaths/min): 16 Body Mass Index (BMI): 26 Blood Pressure (mmHg): 130/82 Reference Range: 80 - 120 mg / dl Electronic Signature(s) Signed: 04/26/2022 5:00:42 PM By: [name] Entered  By: [name] on 04/25/2022 12:50:18

## 2022-05-09 ENCOUNTER — Encounter: Payer: Medicare HMO | Attending: Physician Assistant | Admitting: Physician Assistant

## 2022-05-09 DIAGNOSIS — Z89421 Acquired absence of other right toe(s): Secondary | ICD-10-CM | POA: Insufficient documentation

## 2022-05-09 DIAGNOSIS — I87333 Chronic venous hypertension (idiopathic) with ulcer and inflammation of bilateral lower extremity: Secondary | ICD-10-CM | POA: Insufficient documentation

## 2022-05-09 DIAGNOSIS — L97812 Non-pressure chronic ulcer of other part of right lower leg with fat layer exposed: Secondary | ICD-10-CM | POA: Diagnosis not present

## 2022-05-09 DIAGNOSIS — L03115 Cellulitis of right lower limb: Secondary | ICD-10-CM | POA: Insufficient documentation

## 2022-05-09 DIAGNOSIS — X58XXXA Exposure to other specified factors, initial encounter: Secondary | ICD-10-CM | POA: Insufficient documentation

## 2022-05-09 DIAGNOSIS — I7389 Other specified peripheral vascular diseases: Secondary | ICD-10-CM | POA: Insufficient documentation

## 2022-05-09 DIAGNOSIS — S51812A Laceration without foreign body of left forearm, initial encounter: Secondary | ICD-10-CM | POA: Insufficient documentation

## 2022-05-09 NOTE — Progress Notes (Addendum)
Brandy, Mccarthy ([id number]) [id number] Page 1 of 8 Visit Report for 05/09/2022 Arrival Information Details Patient Name: Date of Service: [name], Brandy [name] 05/09/2022 10:30 A M Medical Record Number: [id number] Patient Account Number: [id number] Date of Birth/Sex: Treating RN: 10/30/1950 (71 y.o. [name] Primary Care Ainsley Deakins: [name] Other Clinician: Massie Kluver Referring [name]: Treating [name]/Extender: [name] Weeks in Treatment: 28 Visit Information History Since Last Visit All ordered tests and consults were completed: No Patient Arrived: Ambulatory Added or deleted any medications: No Arrival Time: 10:53 Any new allergies or adverse reactions: No Transfer Assistance: None Had a fall or experienced change in No Patient Identification Verified: Yes activities of daily living that may affect Secondary Verification Process Completed: Yes risk of falls: Patient Requires Transmission-Based Precautions: No Signs or symptoms of abuse/neglect since last visito No Patient Has Alerts: Yes Hospitalized since last visit: No Patient Alerts: Patient on Blood Thinner Implantable device outside of the clinic excluding No cellular tissue based products placed in the center since last visit: Has Dressing in Place as Prescribed: Yes Has Compression in Place as Prescribed: Yes Pain Present Now: No Electronic Signature(s) Signed: 05/09/2022 4:50:46 PM By: Massie Kluver Entered By: Massie Kluver on 05/09/2022 10:53:45 -------------------------------------------------------------------------------- Clinic Level of Care Assessment Details Patient Name: Date of Service: [name], [name]. 05/09/2022 10:30 A M Medical Record Number: [id number] Patient Account Number: [id number] Date of Birth/Sex: Treating RN: 29-Dec-1950 (71 y.o. [name] Primary Care [name]: [name] Other Clinician: Massie Kluver Referring  [name]: Treating [name]/Extender: [name] Weeks in Treatment: 22 Clinic Level of Care Assessment Items TOOL 4 Quantity Score '[]'$  - 0 Use when only an EandM is performed on FOLLOW-UP visit ASSESSMENTS - Nursing Assessment / Reassessment X- 1 10 Reassessment of Co-morbidities (includes updates in patient status) Brandy, Mccarthy ([id number]) [id number] Page 2 of 8 X- 1 5 Reassessment of Adherence to Treatment Plan ASSESSMENTS - Wound and Skin A ssessment / Reassessment X - Simple Wound Assessment / Reassessment - one wound 1 5 '[]'$  - 0 Complex Wound Assessment / Reassessment - multiple wounds '[]'$  - 0 Dermatologic / Skin Assessment (not related to wound area) ASSESSMENTS - Focused Assessment '[]'$  - 0 Circumferential Edema Measurements - multi extremities '[]'$  - 0 Nutritional Assessment / Counseling / Intervention '[]'$  - 0 Lower Extremity Assessment (monofilament, tuning fork, pulses) '[]'$  - 0 Peripheral Arterial Disease Assessment (using hand held doppler) ASSESSMENTS - Ostomy and/or Continence Assessment and Care '[]'$  - 0 Incontinence Assessment and Management '[]'$  - 0 Ostomy Care Assessment and Management (repouching, etc.) PROCESS - Coordination of Care X - Simple Patient / Family Education for ongoing care 1 15 '[]'$  - 0 Complex (extensive) Patient / Family Education for ongoing care '[]'$  - 0 Staff obtains Programmer, systems, Records, T Results / Process Orders est '[]'$  - 0 Staff telephones HHA, Nursing Homes / Clarify orders / etc '[]'$  - 0 Routine Transfer to another Facility (non-emergent condition) '[]'$  - 0 Routine Hospital Admission (non-emergent condition) '[]'$  - 0 New Admissions / Biomedical engineer / Ordering NPWT Apligraf, etc. , '[]'$  - 0 Emergency Hospital Admission (emergent condition) X- 1 10 Simple Discharge Coordination '[]'$  - 0 Complex (extensive) Discharge Coordination PROCESS - Special Needs '[]'$  - 0 Pediatric / Minor Patient  Management '[]'$  - 0 Isolation Patient Management '[]'$  - 0 Hearing / Language / Visual special needs '[]'$  - 0 Assessment of Community assistance (transportation, D/C planning, etc.) '[]'$  - 0 Additional assistance / Altered  mentation '[]'$  - 0 Support Surface(s) Assessment (bed, cushion, seat, etc.) INTERVENTIONS - Wound Cleansing / Measurement X - Simple Wound Cleansing - one wound 1 5 '[]'$  - 0 Complex Wound Cleansing - multiple wounds X- 1 5 Wound Imaging (photographs - any number of wounds) '[]'$  - 0 Wound Tracing (instead of photographs) X- 1 5 Simple Wound Measurement - one wound '[]'$  - 0 Complex Wound Measurement - multiple wounds INTERVENTIONS - Wound Dressings X - Small Wound Dressing one or multiple wounds 1 10 '[]'$  - 0 Medium Wound Dressing one or multiple wounds '[]'$  - 0 Large Wound Dressing one or multiple wounds X- 1 5 Application of Medications - topical '[]'$  - 0 Application of Medications - injection INTERVENTIONS - Miscellaneous [name], [name] ([id number]) [id number] Page 3 of 8 '[]'$  - 0 External ear exam '[]'$  - 0 Specimen Collection (cultures, biopsies, blood, body fluids, etc.) '[]'$  - 0 Specimen(s) / Culture(s) sent or taken to Lab for analysis '[]'$  - 0 Patient Transfer (multiple staff / [name] Lift / Similar devices) '[]'$  - 0 Simple Staple / Suture removal (25 or less) '[]'$  - 0 Complex Staple / Suture removal (26 or more) '[]'$  - 0 Hypo / Hyperglycemic Management (close monitor of Blood Glucose) '[]'$  - 0 Ankle / Brachial Index (ABI) - do not check if billed separately X- 1 5 Vital Signs Has the patient been seen at the hospital within the last three years: Yes Total Score: 80 Level Of Care: New/Established - Level 3 Electronic Signature(s) Signed: 05/09/2022 4:50:46 PM By: Massie Kluver Entered By: Massie Kluver on 05/09/2022 11:16:50 -------------------------------------------------------------------------------- Encounter Discharge Information  Details Patient Name: Date of Service: Brandy Shiver [name] 05/09/2022 10:30 A M Medical Record Number: [id number] Patient Account Number: [id number] Date of Birth/Sex: Treating RN: 05-Jul-1950 (71 y.o. [name] Primary Care Eddy Liszewski: [name] Other Clinician: Massie Kluver Referring Yaneli Keithley: Treating Laverda Stribling/Extender: [name] Weeks in Treatment: 63 Encounter Discharge Information Items Discharge Condition: Stable Ambulatory Status: Ambulatory Discharge Destination: Home Transportation: Private Auto Accompanied By: self Schedule Follow-up Appointment: No Clinical Summary of Care: Electronic Signature(s) Signed: 05/09/2022 4:50:46 PM By: Massie Kluver Entered By: Massie Kluver on 05/09/2022 11:26:30 Gerstenberger, [name] ([id number]) [id number] Page 4 of 8 -------------------------------------------------------------------------------- Lower Extremity Assessment Details Patient Name: Date of Service: [name], [name] [name] 05/09/2022 10:30 A M Medical Record Number: [id number] Patient Account Number: [id number] Date of Birth/Sex: Treating RN: [birth year] (71 y.o. [name] Primary Care June Rode: [name] Other Clinician: Massie Kluver Referring Guelda Batson: Treating Ladashia Demarinis/Extender: [name] Weeks in Treatment: 22 Edema Assessment Assessed: [Left: No] [Right: Yes] Edema: [Left: Ye] [Right: s] Calf Left: Right: Point of Measurement: 32 cm From Medial Instep 29.8 cm Ankle Left: Right: Point of Measurement: 10 cm From Medial Instep 20 cm Vascular Assessment Pulses: Dorsalis Pedis Palpable: [Right:Yes] Electronic Signature(s) Signed: 05/09/2022 2:54:45 PM By: Gretta Cool, BSN, RN, CWS, [name] RN, BSN Signed: 05/09/2022 4:50:46 PM By: Massie Kluver Entered By: Massie Kluver on 05/09/2022 11:04:31 -------------------------------------------------------------------------------- Multi Wound Chart Details Patient Name: Date  of Service: Brandy Shiver [name] 05/09/2022 10:30 A M Medical Record Number: [id number] Patient Account Number: [id number] Date of Birth/Sex: Treating RN: 09/04/1950 (71 y.o. [name] Primary Care Deldrick Linch: [name] Other Clinician: Massie Kluver Referring Traven Davids: Treating Esley Brooking/Extender: [name] Weeks in Treatment: 22 Vital Signs Height(in): 63 Pulse(bpm): 90 Weight(lbs): 147 Blood Pressure(mmHg): 148/82 Body Mass Index(BMI): 26 Temperature(F): 98.3 Respiratory Rate(breaths/min): 16 [2:Photos:] [N/A:N/A] Right, Lateral Lower Leg  N/A N/A Wound Location: Gradually Appeared N/A N/A Wounding Event: Venous Leg Ulcer N/A N/A Primary Etiology: Hypertension, Peripheral Arterial N/A N/A Comorbid History: Disease 07/08/2021 N/A N/A Date Acquired: 65 N/A N/A Weeks of Treatment: Open N/A N/A Wound Status: No N/A N/A Wound Recurrence: 0.2x0.2x0.1 N/A N/A Measurements L x W x D (cm) 0.031 N/A N/A A (cm) : rea 0.003 N/A N/A Volume (cm) : 99.60% N/A N/A % Reduction in Area: 99.90% N/A N/A % Reduction in Volume: Full Thickness Without Exposed N/A N/A Classification: Support Structures Medium N/A N/A Exudate Amount: Serosanguineous N/A N/A Exudate Type: red, brown N/A N/A Exudate Color: None Present (0%) N/A N/A Granulation Amount: None Present (0%) N/A N/A Necrotic Amount: Fat Layer (Subcutaneous Tissue): Yes N/A N/A Exposed Structures: Fascia: No Tendon: No Muscle: No Joint: No Bone: No Medium (34-66%) N/A N/A Epithelialization: Treatment Notes Electronic Signature(s) Signed: 05/09/2022 4:50:46 PM By: Massie Kluver Entered By: Massie Kluver on 05/09/2022 11:04:42 -------------------------------------------------------------------------------- Multi-Disciplinary Care Plan Details Patient Name: Date of Service: Brandy Shiver [name] 05/09/2022 10:30 A M Medical Record Number: [id number] Patient Account Number: [id number] Date of  Birth/Sex: Treating RN: Jun 29, 1950 (71 y.o. [name] Primary Care Ariely Riddell: [name] Other Clinician: Massie Kluver Referring Santez Woodcox: Treating Quinita Kostelecky/Extender: [name] Weeks in Treatment: 22 Active Inactive Electronic Signature(s) Signed: 05/09/2022 2:54:45 PM By: Gretta Cool BSN, RN, CWS, [name] RN, BSN Signed: 05/09/2022 4:50:46 PM By: Massie Kluver Entered By: Massie Kluver on 05/09/2022 11:18:07 [name], [name] ([id number]) [id number] Page 6 of 8 -------------------------------------------------------------------------------- Pain Assessment Details Patient Name: Date of Service: [name], [name] [name] 05/09/2022 10:30 A M Medical Record Number: [id number] Patient Account Number: [id number] Date of Birth/Sex: Treating RN: 01/18/1951 (71 y.o. [name] Primary Care Cowen Pesqueira: [name] Other Clinician: Massie Kluver Referring Kazumi Lachney: Treating Amelda Hapke/Extender: [name] Weeks in Treatment: 22 Active Problems Location of Pain Severity and Description of Pain Patient Has Paino No Site Locations Pain Management and Medication Current Pain Management: Electronic Signature(s) Signed: 05/09/2022 2:54:45 PM By: Gretta Cool, BSN, RN, CWS, [name] RN, BSN Signed: 05/09/2022 4:50:46 PM By: Massie Kluver Entered By: Massie Kluver on 05/09/2022 10:56:16 -------------------------------------------------------------------------------- Patient/Caregiver Education Details Patient Name: Date of Service: Brandy Shiver [name] 12/4/2023andnbsp10:30 A M Medical Record Number: [id number] Patient Account Number: [id number] Date of Birth/Gender: Treating RN: Nov 07, 1950 (71 y.o. [name] Primary Care Physician: [name] Other Clinician: Massie Kluver Referring Physician: Treating Physician/Extender: [name] Weeks in Treatment: [address] [place] ([id number]) [id number] Page 7 of 8 Education  Assessment Education Provided To: Patient Education Topics Provided Wound/Skin Impairment: Handouts: Other: Your wound has healed. Please call if any further issues arise Methods: Explain/Verbal Responses: State content correctly Electronic Signature(s) Signed: 05/09/2022 4:50:46 PM By: Massie Kluver Entered By: Massie Kluver on 05/09/2022 11:17:52 -------------------------------------------------------------------------------- Wound Assessment Details Patient Name: Date of Service: [name], [name] [name] 05/09/2022 10:30 A M Medical Record Number: [id number] Patient Account Number: [id number] Date of Birth/Sex: Treating RN: [birth year] (71 y.o. [name], [name] Primary Care Geetika Laborde: [name] Other Clinician: Massie Kluver Referring Lener Ventresca: Treating Lagina Reader/Extender: [name] Weeks in Treatment: 22 Wound Status Wound Number: 2 Primary Etiology: Venous Leg Ulcer Wound Location: Right, Lateral Lower Leg Wound Status: Healed - Epithelialized Wounding Event: Gradually Appeared Comorbid History: Hypertension, Peripheral Arterial Disease Date Acquired: 07/08/2021 Weeks Of Treatment: 22 Clustered Wound: No Photos Wound Measurements Length: (cm) Width: (cm) Depth: (cm) Area: (cm) Volume: (cm) 0 % Reduction in Area: 100% 0 %  Reduction in Volume: 100% 0 Epithelialization: Medium (34-66%) 0 Tunneling: No 0 Undermining: No Wound Description Classification: Full Thickness Without Exposed Support Structures Exudate Amount: Medium Exudate Type: Serosanguineous Melder, Marquette K ([id number]) Exudate Color: red, brown Foul Odor After Cleansing: No Slough/Fibrino Yes [id number] Page 8 of 8 Wound Bed Granulation Amount: None Present (0%) Exposed Structure Necrotic Amount: None Present (0%) Fascia Exposed: No Fat Layer (Subcutaneous Tissue) Exposed: Yes Tendon Exposed: No Muscle Exposed: No Joint Exposed: No Bone Exposed: No Treatment  Notes Wound #2 (Lower Leg) Wound Laterality: Right, Lateral Cleanser Peri-Wound Care Topical Primary Dressing Secondary Dressing Secured With Compression Wrap Compression Stockings Add-Ons Electronic Signature(s) Signed: 05/09/2022 2:54:45 PM By: Gretta Cool, BSN, RN, CWS, [name] RN, BSN Signed: 05/09/2022 4:50:46 PM By: Massie Kluver Entered By: Massie Kluver on 05/09/2022 11:13:58 -------------------------------------------------------------------------------- [place] Details Patient Name: Date of Service: Brandy Shiver [name] 05/09/2022 10:30 A M Medical Record Number: [id number] Patient Account Number: [id number] Date of Birth/Sex: Treating RN: 1950/07/19 (71 y.o. [name], [name] Primary Care [name] [name]: [name] Other Clinician: Massie Kluver Referring Elizer Bostic: Treating Pierra Skora/Extender: [name] Weeks in Treatment: 22 Vital Signs Time Taken: 10:53 Temperature (F): 98.3 Height (in): 63 Pulse (bpm): 90 Weight (lbs): 147 Respiratory Rate (breaths/min): 16 Body Mass Index (BMI): 26 Blood Pressure (mmHg): 148/82 Reference Range: 80 - 120 mg / dl Electronic Signature(s) Signed: 05/09/2022 4:50:46 PM By: Massie Kluver Entered By: Massie Kluver on 05/09/2022 10:56:09

## 2022-05-09 NOTE — Progress Notes (Addendum)
REID, NAWROT ([id number]) [id number] Page 1 of 7 Visit Report for 05/09/2022 Chief Complaint Document Details Patient Name: Date of Service: [name], Brandy [name] 05/09/2022 10:30 A M Medical Record Number: [id number] Patient Account Number: [id number] Date of Birth/Sex: Treating RN: 1950-09-08 (71 y.o. [name] Primary Care Provider: [name] Other Clinician: Massie Kluver Referring Provider: Treating Provider/Extender: [name] Weeks in Treatment: 22 Information Obtained from: Patient Chief Complaint Bilateral LE Ulcers Electronic Signature(s) Signed: 05/09/2022 10:36:51 AM By: Worthy Keeler PA-C Entered By: Worthy Keeler on 05/09/2022 10:36:51 -------------------------------------------------------------------------------- HPI Details Patient Name: Date of Service: Dettmann, Brandy [name] 05/09/2022 10:30 A M Medical Record Number: [id number] Patient Account Number: [id number] Date of Birth/Sex: Treating RN: 12/16/1950 (71 y.o. [name] Primary Care Provider: [name] Other Clinician: Massie Kluver Referring Provider: Treating Provider/Extender: [name] Weeks in Treatment: 22 History of Present Illness HPI Description: 11-30-2021 upon evaluation today patient presents for initial inspection here in our clinic concerning wounds that she has over the right lateral leg as well as the right medial leg. Subsequently the patient is dealing with lower extremity edema which I do believe has been one of the big causes of what is going on here. She also does have what appears to be cellulitis of the leg as well as peripheral vascular disease. She already has amputation of a toe on the right foot. With that being said I did review that surgical pathology from the amputation this was the right third toe which showed a gangrenous ulcer with underlying cellulitis negative for acute osteomyelitis but acute cellulitis was  present in the proximal margin. There was also noted on this pathology report a biopsy from the right leg which showed a gangrenous ulcer with underlying cellulitis and foreign body type giant cell reaction. That was performed on 11-11-2021 and resulted on 11-15-2021 She is also had lower extremity arterial duplex studies which did show that she had falsely elevated ABI on the right with a TBI of 0.45. On the left she had an ABI of 1.05 with a TBI of 0.12. She is pretty much monophasic throughout. With that being said there was definite reason for concern with regard to her blood flow into the legs she does have a follow-up with the vascular surgeon coming up shortly as well. 12-14-2021 upon evaluation today patient appears to be doing decently well currently in regard to her wound. We are definitely seeing some signs of improvement there is definitely some necrotic tissue as well but try to clean some this away carefully today. Fortunately I think that she is going to tolerate the debridement without complication she otherwise seems to be healing excellent. I think get a lot of the dead tissue clearway will make a big difference as far as her healing is concerned. [name], [name] ([id number]) [id number] Page 2 of 7 12-21-2021 upon evaluation today patient appears to be doing well currently in regard to her legs she has been using the Hibiclens which she tells me seems to be helping. She tells me the leg feels much better washing with this compared to what it was previous which is great news. Fortunately I do not see any signs of active infection and I am overall extremely pleased with where we stand. I do think we are on the right track here. 01-04-2022 upon evaluation today patient appears to be doing unfortunately somewhat worse in regard to the new pustule areas that are appearing on  her lower extremity. Again I have discussed with her based on what I am seeing that though  she does have a wound I do not feel like these new pustule areas are related to the wound in particular. Again I am unsure of exactly what is going on and I really think she would benefit from seeing a dermatology specialist. She voiced understanding and is not opposed to this she did see a dermatologist in [place] but they were under the impression they were just to do a biopsy which she had already had and therefore really did not do much she tells me that so I really know about that encounter. With that being said in general I do feel like that the patient is doing better although that is more in relation to the wound I think these new pustule areas still have me concerned. I do think she could be a candidate for topical antibiotics such as [employer] but at the same time I do think that we need to continue to monitor for any signs of overall worsening infection in the interim. 01-11-2022 upon evaluation today patient appears to be doing well currently in regard to her wounds nothing seems to be doing worse although she continues to have issues with multiple pustules forming I am really not certain that this is actually infection. I am beginning to suspect something rheumatologic/autoimmune. With that being said she did see dermatology today and it was felt by the dermatologist that this is likely infectious despite the fact we had a negative culture from when I saw her last week. They did repeat a culture on one of the pustules. 01-25-2022 upon evaluation today patient appears to be doing better currently in regard to her wounds in general. Everything is showing signs of improvement which is great news. Fortunately I see no evidence of active infection locally or systemically at this time which is great news. She does have a new skin tear on her forearm but this she did try to pull back over and has done quite well. 02-08-2022 upon evaluation today patient appears to be doing excellent currently in  regard to her wound. She has been tolerating the dressing changes without complication and again she still seems to be making progress. Despite this she still has a lot of blisters that keep popping up again I have cultured them and they have been cultured twice other once at dermatology once I believe with primary care never have we identified any causative organisms. I am beginning to believe in fact have made a referral to Dr. Posey Pronto, rheumatology, in order to see if there is some other type of inflammatory process they were dealing with here. I think that this is becoming increasingly likely. 02-21-2022 upon evaluation today patient appears to be doing well currently in regard to her wounds. In fact everything is showing signs of improvement which is good news she has found some compression that she was interested in that seems to have been doing extremely well at this point. I feel like that may be part of what is improving the here overall. 03-07-2022 upon evaluation today patient actually is making some good progress here in regard to her right leg I am very pleased in this regard. With that being said she does have a wound on her left leg which does appear to be a venous leg ulcer that occurred as a result of a blister that arose and then burst over the past week. Fortunately there does not  appear to be any evidence of active infection at this time which is great news. No fevers, chills, nausea, vomiting, or diarrhea. 03-21-2022 upon evaluation patient's wound is actually showing signs of significant improvement which is great news and overall I am extremely pleased with where we stand today. I do not see any evidence of infection locally or systemically which is great news. No fevers, chills, nausea, vomiting, or diarrhea. 04-04-2022 upon evaluation today patient appears to be doing better in regard to her right lateral ulcer. The anterior ulcer on the leg is also doing better. She still has  several of these pustules that come and go but again they are not significant compared to last time I saw her even. I do believe the clobetasol that was given by dermatology has been of benefit as well and this is great news. 04-19-2022 upon evaluation today patient appears to be doing well actually in regard to her wounds. She has been tolerating the dressing changes without complication every time I see her things actually seem to be getting better which is great news. Fortunately I do not see any evidence of active infection locally nor systemically at this time which is great news. No fevers, chills, nausea, vomiting, or diarrhea. 04-25-2022 upon evaluation today patient appears to be doing well currently in regard to her wounds. She is tolerating the dressing changes without complication. Fortunately there does not appear to be any signs of infection which is great news and overall I think she is making great progress. 05-09-2022 upon evaluation today patient actually appears to be completely healed based on what we are seeing today. Fortunately there does not appear to be any evidence of infection locally or systemically at this time. Electronic Signature(s) Signed: 05/09/2022 11:25:53 AM By: Worthy Keeler PA-C Entered By: Worthy Keeler on 05/09/2022 11:25:53 -------------------------------------------------------------------------------- Physical Exam Details Patient Name: Date of Service: ARI, ENGELBRECHT [name] 05/09/2022 10:30 A M Medical Record Number: [id number] Patient Account Number: [id number] Date of Birth/Sex: Treating RN: 18-Dec-1950 (71 y.o. [name] Primary Care Provider: [name] Other Clinician: Massie Kluver Referring Provider: Treating Provider/Extender: [name] Weeks in Treatment: 35 Constitutional Well-nourished and well-hydrated in no acute distress. RASHAUNA, TEP ([id number]) [id number] Page 3 of  7 Respiratory normal breathing without difficulty. Psychiatric this patient is able to make decisions and demonstrates good insight into disease process. Alert and Oriented x 3. pleasant and cooperative. Notes Upon inspection patient's wound bed showed evidence of complete epithelization she seems to be doing quite well and her leg in general is doing excellent. She did see Dr. Posey Pronto he mentioned the possibility of an immune suppressant to help with the symptoms though her blood work came back looking great. In the end she wants to try to manage this however without that and being that she has healed and seems to be doing well I think that is definitely an okay way to go at this point. We discussed all of this today as well. Electronic Signature(s) Signed: 05/09/2022 11:26:22 AM By: Worthy Keeler PA-C Entered By: Worthy Keeler on 05/09/2022 11:26:22 -------------------------------------------------------------------------------- Physician Orders Details Patient Name: Date of Service: [name] [name] 05/09/2022 10:30 A M Medical Record Number: [id number] Patient Account Number: [id number] Date of Birth/Sex: Treating RN: 11/18/1950 (71 y.o. [name] Primary Care Provider: [name] Other Clinician: Massie Kluver Referring Provider: Treating Provider/Extender: [name] Weeks in Treatment: 56 Verbal / Phone Orders: No Diagnosis Coding ICD-10  Coding Code Description I87.333 Chronic venous hypertension (idiopathic) with ulcer and inflammation of bilateral lower extremity L97.812 Non-pressure chronic ulcer of other part of right lower leg with fat layer exposed L03.115 Cellulitis of right lower limb S51.812A Laceration without foreign body of left forearm, initial encounter L97.822 Non-pressure chronic ulcer of other part of left lower leg with fat layer exposed I73.89 Other specified peripheral vascular diseases 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 your wound has healed. Please call if any further issues arise Edema Control - Lymphedema / Segmental Compressive Device / Other Tubigrip single layer applied. - Wear Tubi D double daily, take off at night Medications-Please add to medication list. Other: - use AandD ointment to area and apply siliver alginate x 1 week, then stop the alginate Electronic Signature(s) Signed: 05/09/2022 4:48:10 PM By: Worthy Keeler PA-C Signed: 05/09/2022 4:50:46 PM By: Massie Kluver Entered By: Massie Kluver on 05/09/2022 11:25:52 Sterling, [name] ([id number]) [id number] Page 4 of 7 -------------------------------------------------------------------------------- Problem List Details Patient Name: Date of Service: [name], GUTHRIDGE [name] 05/09/2022 10:30 A M Medical Record Number: [id number] Patient Account Number: [id number] Date of Birth/Sex: Treating RN: July 22, 1950 (71 y.o. [name], [name] Primary Care Provider: [name] Other Clinician: Massie Kluver Referring Provider: Treating Provider/Extender: [name] Weeks in Treatment: 22 Active Problems ICD-10 Encounter Code Description Active Date MDM Diagnosis I87.333 Chronic venous hypertension (idiopathic) with ulcer and inflammation of 11/30/2021 No Yes bilateral lower extremity L97.812 Non-pressure chronic ulcer of other part of right lower leg with fat layer 11/30/2021 No Yes exposed L03.115 Cellulitis of right lower limb 11/30/2021 No Yes S51.812A Laceration without foreign body of left forearm, initial encounter 01/25/2022 No Yes L97.822 Non-pressure chronic ulcer of other part of left lower leg with fat layer exposed10/07/2021 No Yes I73.89 Other specified peripheral vascular diseases 11/30/2021 No Yes Z89.421 Acquired absence of other right toe(s) 11/30/2021 No Yes Inactive Problems Resolved  Problems Electronic Signature(s) Signed: 05/09/2022 10:36:48 AM By: Worthy Keeler PA-C Entered By: Worthy Keeler on 05/09/2022 10:36:47 [name], [name] ([id number]) [id number] Page 5 of 7 -------------------------------------------------------------------------------- Progress Note Details Patient Name: Date of Service: [name], Brandy [name] 05/09/2022 10:30 A M Medical Record Number: [id number] Patient Account Number: [id number] Date of Birth/Sex: Treating RN: 05-15-1951 (71 y.o. [name] Primary Care Provider: [name] Other Clinician: Massie Kluver Referring Provider: Treating Provider/Extender: [name] Weeks in Treatment: 22 Subjective Chief Complaint Information obtained from Patient Bilateral LE Ulcers History of Present Illness (HPI) 11-30-2021 upon evaluation today patient presents for initial inspection here in our clinic concerning wounds that she has over the right lateral leg as well as the right medial leg. Subsequently the patient is dealing with lower extremity edema which I do believe has been one of the big causes of what is going on here. She also does have what appears to be cellulitis of the leg as well as peripheral vascular disease. She already has amputation of a toe on the right foot. With that being said I did review that surgical pathology from the amputation this was the right third toe which showed a gangrenous ulcer with underlying cellulitis negative for acute osteomyelitis but acute cellulitis was present in the proximal margin. There was also noted on this pathology report a biopsy from the right leg which showed a gangrenous ulcer with underlying cellulitis and foreign body type giant cell reaction. That  was performed on 11-11-2021 and resulted on 11-15-2021 She is also had lower extremity arterial duplex studies which did show that she had falsely elevated ABI on the right with a TBI of 0.45. On the left  she had an ABI of 1.05 with a TBI of 0.12. She is pretty much monophasic throughout. With that being said there was definite reason for concern with regard to her blood flow into the legs she does have a follow-up with the vascular surgeon coming up shortly as well. 12-14-2021 upon evaluation today patient appears to be doing decently well currently in regard to her wound. We are definitely seeing some signs of improvement there is definitely some necrotic tissue as well but try to clean some this away carefully today. Fortunately I think that she is going to tolerate the debridement without complication she otherwise seems to be healing excellent. I think get a lot of the dead tissue clearway will make a big difference as far as her healing is concerned. 12-21-2021 upon evaluation today patient appears to be doing well currently in regard to her legs she has been using the Hibiclens which she tells me seems to be helping. She tells me the leg feels much better washing with this compared to what it was previous which is great news. Fortunately I do not see any signs of active infection and I am overall extremely pleased with where we stand. I do think we are on the right track here. 01-04-2022 upon evaluation today patient appears to be doing unfortunately somewhat worse in regard to the new pustule areas that are appearing on her lower extremity. Again I have discussed with her based on what I am seeing that though she does have a wound I do not feel like these new pustule areas are related to the wound in particular. Again I am unsure of exactly what is going on and I really think she would benefit from seeing a dermatology specialist. She voiced understanding and is not opposed to this she did see a dermatologist in [place] but they were under the impression they were just to do a biopsy which she had already had and therefore really did not do much she tells me that so I really know about that  encounter. With that being said in general I do feel like that the patient is doing better although that is more in relation to the wound I think these new pustule areas still have me concerned. I do think she could be a candidate for topical antibiotics such as [employer] but at the same time I do think that we need to continue to monitor for any signs of overall worsening infection in the interim. 01-11-2022 upon evaluation today patient appears to be doing well currently in regard to her wounds nothing seems to be doing worse although she continues to have issues with multiple pustules forming I am really not certain that this is actually infection. I am beginning to suspect something rheumatologic/autoimmune. With that being said she did see dermatology today and it was felt by the dermatologist that this is likely infectious despite the fact we had a negative culture from when I saw her last week. They did repeat a culture on one of the pustules. 01-25-2022 upon evaluation today patient appears to be doing better currently in regard to her wounds in general. Everything is showing signs of improvement which is great news. Fortunately I see no evidence of active infection locally or systemically at this time  which is great news. She does have a new skin tear on her forearm but this she did try to pull back over and has done quite well. 02-08-2022 upon evaluation today patient appears to be doing excellent currently in regard to her wound. She has been tolerating the dressing changes without complication and again she still seems to be making progress. Despite this she still has a lot of blisters that keep popping up again I have cultured them and they have been cultured twice other once at dermatology once I believe with primary care never have we identified any causative organisms. I am beginning to believe in fact have made a referral to Dr. Posey Pronto, rheumatology, in order to see if there is some other  type of inflammatory process they were dealing with here. I think that this is becoming increasingly likely. 02-21-2022 upon evaluation today patient appears to be doing well currently in regard to her wounds. In fact everything is showing signs of improvement which is good news she has found some compression that she was interested in that seems to have been doing extremely well at this point. I feel like that may be part of what is improving the here overall. 03-07-2022 upon evaluation today patient actually is making some good progress here in regard to her right leg I am very pleased in this regard. With that being said she does have a wound on her left leg which does appear to be a venous leg ulcer that occurred as a result of a blister that arose and then burst over the past week. Fortunately there does not appear to be any evidence of active infection at this time which is great news. No fevers, chills, nausea, vomiting, or diarrhea. 03-21-2022 upon evaluation patient's wound is actually showing signs of significant improvement which is great news and overall I am extremely pleased with where we stand today. I do not see any evidence of infection locally or systemically which is great news. No fevers, chills, nausea, vomiting, or diarrhea. [name], [name] ([id number]) [id number] Page 6 of 7 04-04-2022 upon evaluation today patient appears to be doing better in regard to her right lateral ulcer. The anterior ulcer on the leg is also doing better. She still has several of these pustules that come and go but again they are not significant compared to last time I saw her even. I do believe the clobetasol that was given by dermatology has been of benefit as well and this is great news. 04-19-2022 upon evaluation today patient appears to be doing well actually in regard to her wounds. She has been tolerating the dressing changes without complication every time I see her  things actually seem to be getting better which is great news. Fortunately I do not see any evidence of active infection locally nor systemically at this time which is great news. No fevers, chills, nausea, vomiting, or diarrhea. 04-25-2022 upon evaluation today patient appears to be doing well currently in regard to her wounds. She is tolerating the dressing changes without complication. Fortunately there does not appear to be any signs of infection which is great news and overall I think she is making great progress. 05-09-2022 upon evaluation today patient actually appears to be completely healed based on what we are seeing today. Fortunately there does not appear to be any evidence of infection locally or systemically at this time. Objective Constitutional Well-nourished and well-hydrated in no acute distress. Vitals Time Taken: 10:53 AM, Height: 63 in, Weight: 147  lbs, BMI: 26, Temperature: 98.3 F, Pulse: 90 bpm, Respiratory Rate: 16 breaths/min, Blood Pressure: 148/82 mmHg. Respiratory normal breathing without difficulty. Psychiatric this patient is able to make decisions and demonstrates good insight into disease process. Alert and Oriented x 3. pleasant and cooperative. General Notes: Upon inspection patient's wound bed showed evidence of complete epithelization she seems to be doing quite well and her leg in general is doing excellent. She did see Dr. Posey Pronto he mentioned the possibility of an immune suppressant to help with the symptoms though her blood work came back looking great. In the end she wants to try to manage this however without that and being that she has healed and seems to be doing well I think that is definitely an okay way to go at this point. We discussed all of this today as well. Integumentary (Hair, Skin) Wound #2 status is Healed - Epithelialized. Original cause of wound was Gradually Appeared. The date acquired was: 07/08/2021. The wound has been in treatment 22  weeks. The wound is located on the Right,Lateral Lower Leg. The wound measures 0cm length x 0cm width x 0cm depth; 0cm^2 area and 0cm^3 volume. There is Fat Layer (Subcutaneous Tissue) exposed. There is no tunneling or undermining noted. There is a medium amount of serosanguineous drainage noted. There is no granulation within the wound bed. There is no necrotic tissue within the wound bed. Assessment Active Problems ICD-10 Chronic venous hypertension (idiopathic) with ulcer and inflammation of bilateral lower extremity Non-pressure chronic ulcer of other part of right lower leg with fat layer exposed Cellulitis of right lower limb Laceration without foreign body of left forearm, initial encounter Non-pressure chronic ulcer of other part of left lower leg with fat layer exposed Other specified peripheral vascular diseases Acquired absence of other right toe(s) Plan Discharge From [facility] Services: Discharge from [facility]! your wound has healed. Please call if any further issues arise Edema Control - Lymphedema / Segmental Compressive Device / Other: Tubigrip single layer applied. - Wear Tubi D double daily, take off at night Medications-Please add to medication list.: Other: - use AandD ointment to area and apply siliver alginate x 1 week, then stop the alginate 1. I am going to recommend that we have the patient continue to monitor for any signs of worsening or infection. Obviously if anything changes she will contact the office and let me know. 2. I am also can recommend that we go ahead and discontinue wound care services at this point as she appears to be completely healed. She is going to use AandD ointment apply twice a day she will use silver alginate during the day only for 1 week using the Tubigrip during the day then taking everything off at night when she gets past the week she will discontinue the use of the silver alginate completely just  using AandD ointment twice a day wearing the Tubigrip during the day and taking it off at bedtime going forward. BARRI, NEIDLINGER ([id number]) [id number] Page 7 of 7 Will see her back for follow-up visit as needed. Electronic Signature(s) Signed: 05/09/2022 11:27:05 AM By: Worthy Keeler PA-C Entered By: Worthy Keeler on 05/09/2022 11:27:05 -------------------------------------------------------------------------------- SuperBill Details Patient Name: Date of Service: [name] [name] 05/09/2022 Medical Record Number: [id number] Patient Account Number: [id number] Date of Birth/Sex: Treating RN: 06-12-1950 (71 y.o. [name] Primary Care Provider: [name] Other Clinician: Massie Kluver Referring Provider: Treating Provider/Extender: Buckner [place], Joylene Igo [place]ella [place]  in Treatment: 22 Diagnosis Coding ICD-10 Codes Code Description I87.333 Chronic venous hypertension (idiopathic) with ulcer and inflammation of bilateral lower extremity L97.812 Non-pressure chronic ulcer of other part of right lower leg with fat layer exposed L03.115 Cellulitis of right lower limb S51.812A Laceration without foreign body of left forearm, initial encounter L97.822 Non-pressure chronic ulcer of other part of left lower leg with fat layer exposed I73.89 Other specified peripheral vascular diseases Z89.421 Acquired absence of other right toe(s) Facility Procedures : CPT4 Code: 09628366 Description: 99213 - WOUND CARE VISIT-LEV 3 EST PT Modifier: Quantity: 1 Physician Procedures : CPT4 Code Description Modifier [id number] - WC PHYS LEVEL 3 - EST PT ICD-10 Diagnosis Description I87.333 Chronic venous hypertension (idiopathic) with ulcer and inflammation of bilateral lower extremity L97.812 Non-pressure chronic ulcer of other  part of right lower leg with fat layer exposed L03.115 Cellulitis of right lower limb S51.812A Laceration without foreign body of left forearm,  initial encounter Quantity: 1 Electronic Signature(s) Signed: 05/09/2022 11:27:17 AM By: Worthy Keeler PA-C Entered By: Worthy Keeler on 05/09/2022 11:27:17

## 2022-11-16 ENCOUNTER — Other Ambulatory Visit: Payer: Self-pay | Admitting: Vascular Surgery

## 2022-12-01 ENCOUNTER — Other Ambulatory Visit: Payer: Self-pay | Admitting: *Deleted

## 2022-12-01 DIAGNOSIS — I70261 Atherosclerosis of native arteries of extremities with gangrene, right leg: Secondary | ICD-10-CM

## 2022-12-01 DIAGNOSIS — I75021 Atheroembolism of right lower extremity: Secondary | ICD-10-CM

## 2022-12-15 NOTE — Progress Notes (Signed)
HISTORY AND PHYSICAL     CC:  follow up. Requesting Provider:  Simone Curia, MD  HPI: This is a 72 y.o. female who is here today for follow up for PAD.  Pt has hx of angiogram with drug eluting stent of the right SFA 08/25/2021 by Dr. Karin Lieu for CLI.  She had gangrene of the right 3rd toe and underwent toe amputation by Dr. Lilian Kapur 11/11/2021.    Pt was last seen 12/03/2021 by Dr. Karin Lieu and at that time, she was doing well and going to wound care for pre tibial ulcerations of the right foot.  Dermatology believed these lesions to be pyoderma.  Her toe amp site healed.  She was not having any claudication, rest pain or non healing wounds.    The pt returns today for follow up.  She denies any claudication, rest pain or non healing wounds.  She does have swelling in BLE with some skin color changes.  She has retired from [employer].  She states that she has worn compression in the past and it did help with the swelling.  She states that she was off her Plavix for 6 days the beginning of June due to some bleeding in the right eye.   The pt is on a statin for cholesterol management.    The pt is on an aspirin.    Other AC:  Plavix The pt is on diuretic, ARB for hypertension.  The pt is not on medication for diabetes. Tobacco hx:  never  Pt does not have family hx of AAA.  Past Medical History:  Diagnosis Date   Arthritis    joints   Asthma    allergic   Breast cancer (HCC)    Gastric mass 05/24/2012   GERD (gastroesophageal reflux disease)    H/O hiatal hernia    Heart murmur    not MVP no treatment   Hyperlipidemia    Hypertension    Insomnia    Osteopenia    Osteoporosis    PONV (postoperative nausea and vomiting)    Rosacea    neck,cheeks    Past Surgical History:  Procedure Laterality Date   ABDOMINAL AORTOGRAM W/LOWER EXTREMITY Right 08/25/2021   Procedure: ABDOMINAL AORTOGRAM W/LOWER EXTREMITY;  Surgeon: Victorino Sparrow, MD;  Location: [facility] INVASIVE CV LAB;  Service:  Cardiovascular;  Laterality: Right;   ABDOMINAL HYSTERECTOMY     AMPUTATION TOE Right 11/11/2021   Procedure: AMPUTATION TOE;  Surgeon: Edwin Cap, DPM;  Location: [facility] OR;  Service: Podiatry;  Laterality: Right;  third toe   Bladder Tack     Blader Tack     BREAST LUMPECTOMY  04-1999   BREAST LUMPECTOMY  05-1999   COLON SURGERY  07-2010   COLONOSCOPY W/ POLYPECTOMY     EDG  with polypectomy (gastric polyps)     EUS  05/24/2012   Procedure: UPPER ENDOSCOPIC ULTRASOUND (EUS) LINEAR;  Surgeon: Rachael Fee, MD;  Location: [facility] ENDOSCOPY;  Service: Endoscopy;  Laterality: N/A;   Lumpectomy 07-2000  2002   Port a cath Insertion  2001   PORT-A-CATH REMOVAL  05-2000   TONSILLECTOMY     WOUND DEBRIDEMENT Right 11/11/2021   Procedure: DEBRIDEMENT WOUND;  Surgeon: Edwin Cap, DPM;  Location: [facility] OR;  Service: Podiatry;  Laterality: Right;    Allergies  Allergen Reactions   Latex Itching   Levofloxacin Other (See Comments)    Possible tendon rupture   Morphine And Codeine Nausea Only  Current Outpatient Medications  Medication Sig Dispense Refill   amitriptyline (ELAVIL) 75 MG tablet Take 75 mg by mouth at bedtime.     aspirin EC 81 MG tablet Take 81 mg by mouth daily. Swallow whole.     atorvastatin (LIPITOR) 20 MG tablet Take 20 mg by mouth at bedtime.     calcium carbonate (OS-CAL) 600 MG TABS Take 600 mg by mouth 2 (two) times daily with a meal. Vitamin D 400 mg     Cholecalciferol (VITAMIN D3 PO) Take 5,000 Units by mouth at bedtime.     clopidogrel (PLAVIX) 75 MG tablet TAKE 1 TABLET EVERY DAY 90 tablet 3   Coenzyme Q10 (COQ10 PO) Take 200 mg by mouth daily.     diphenoxylate-atropine (LOMOTIL) 2.5-0.025 MG tablet Take 1 tablet by mouth every 6 (six) hours.     fluticasone (FLONASE) 50 MCG/ACT nasal spray Place 1 spray into both nostrils daily as needed for allergies or rhinitis.     furosemide (LASIX) 40 MG tablet Take 1 tablet (40 mg total) by mouth daily. 30 tablet 1    gabapentin (NEURONTIN) 600 MG tablet Take 600 mg by mouth 3 (three) times daily.     losartan (COZAAR) 100 MG tablet Take 1 tablet (100 mg total) by mouth daily. 90 tablet 1   Lysine HCl 1000 MG TABS Take 1,000 mg by mouth daily as needed (wound healing).     MAGNESIUM PO Take 400 mg by mouth at bedtime.     montelukast (SINGULAIR) 10 MG tablet Take 10 mg by mouth daily.     Multiple Vitamin (MULTIVITAMIN) tablet Take 1 tablet by mouth daily.     mupirocin ointment (BACTROBAN) 2 % Apply 1 Application topically daily. 60 g 2   pantoprazole (PROTONIX) 40 MG tablet Take 40 mg by mouth daily.     promethazine (PHENERGAN) 25 MG tablet Take 25 mg by mouth every 6 (six) hours as needed for nausea/vomiting.     raloxifene (EVISTA) 60 MG tablet Take 60 mg by mouth daily.     senna-docusate (SENOKOT-S) 8.6-50 MG tablet Take 2 tablets by mouth at bedtime.     No current facility-administered medications for this visit.    No family history on file.  Social History   Socioeconomic History   Marital status: Widowed    Spouse name: Not on file   Number of children: Not on file   Years of education: Not on file   Highest education level: Not on file  Occupational History   Not on file  Tobacco Use   Smoking status: Never   Smokeless tobacco: Never  Vaping Use   Vaping status: Not on file  Substance and Sexual Activity   Alcohol use: No   Drug use: No   Sexual activity: Not Currently    Birth control/protection: Surgical    Comment: hysterectomy  Other Topics Concern   Not on file  Social History Narrative   Not on file   Social Determinants of Health   Financial Resource Strain: Not on file  Food Insecurity: Not on file  Transportation Needs: Not on file  Physical Activity: Not on file  Stress: Not on file  Social Connections: Unknown (10/17/2021)   Received from [facility]   Social Network    Social Network: Not on file  Intimate Partner Violence: Unknown (09/08/2021)    Received from [facility]   HITS    Physically Hurt: Not on file    Insult or  Talk Down To: Not on file    Threaten Physical Harm: Not on file    Scream or Curse: Not on file     REVIEW OF SYSTEMS:   [X]  denotes positive finding, [ ]  denotes negative finding Cardiac  Comments:  Chest pain or chest pressure:    Shortness of breath upon exertion:    Short of breath when lying flat:    Irregular heart rhythm:        Vascular    Pain in calf, thigh, or hip brought on by ambulation:    Pain in feet at night that wakes you up from your sleep:     Blood clot in your veins:    Leg swelling:  x       Pulmonary    Oxygen at home:    Productive cough:     Wheezing:         Neurologic    Sudden weakness in arms or legs:     Sudden numbness in arms or legs:     Sudden onset of difficulty speaking or slurred speech:    Temporary loss of vision in one eye:     Problems with dizziness:         Gastrointestinal    Blood in stool:     Vomited blood:         Genitourinary    Burning when urinating:     Blood in urine:        Psychiatric    Major depression:         Hematologic    Bleeding problems:    Problems with blood clotting too easily:        Skin    Rashes or ulcers:        Constitutional    Fever or chills:      PHYSICAL EXAMINATION:  Today's Vitals   12/16/22 1136  BP: 128/88  Pulse: 87  Temp: 98.5 F (36.9 C)  TempSrc: Temporal  SpO2: 97%  Weight: 143 lb (64.9 kg)   Body mass index is 25.33 kg/m.   General:  WDWN in NAD; vital signs documented above Gait: Not observed HENT: WNL, normocephalic Pulmonary: normal non-labored breathing , without wheezing Cardiac: regular HR, without carotid bruits Abdomen: soft, NT; aortic pulse is not palpable Skin: without rashes Vascular Exam/Pulses:  Right Left  Radial 2+ (normal) 2+ (normal)  DP 2+ (normal) 2+ (normal)   Extremities: without ischemic changes, without Gangrene , without cellulitis;  without open wounds; mild hemosiderin staining bilaterally; mild swelling BLE; well healed toe amp right foot Musculoskeletal: no muscle wasting or atrophy  Neurologic: A&O X 3 Psychiatric:  The pt has Normal affect.   Non-Invasive Vascular Imaging:   ABI's/TBI's on 12/16/2022: Right:  1.08/0.46 - Great toe pressure: 65 Left:  1.27/0.40 - Great toe pressure: 57  Arterial duplex on 12/16/2022: +-----------+--------+-----+--------+----------+--------+  RIGHT     PSV cm/sRatioStenosisWaveform  Comments  +-----------+--------+-----+--------+----------+--------+  CFA Prox   153                  triphasic           +-----------+--------+-----+--------+----------+--------+  DFA       100                  monophasic          +-----------+--------+-----+--------+----------+--------+  SFA Prox   127  triphasic           +-----------+--------+-----+--------+----------+--------+  SFA Mid    116                  triphasic           +-----------+--------+-----+--------+----------+--------+  SFA Distal 83                   triphasic           +-----------+--------+-----+--------+----------+--------+  POP Distal 113                  triphasic           +-----------+--------+-----+--------+----------+--------+  TP Trunk   65                   triphasic           +-----------+--------+-----+--------+----------+--------+  ATA Distal 47                   triphasic           +-----------+--------+-----+--------+----------+--------+  PTA Distal 66                   triphasic           +-----------+--------+-----+--------+----------+--------+  PERO Distal47                   triphasic           +-----------+--------+-----+--------+----------+--------+   Right Stent(s):  +---------------+--------+--------+---------+--------+  Prox SFA       PSV cm/sStenosisWaveform Comments   +---------------+--------+--------+---------+--------+  Prox to Stent  158             triphasic          +---------------+--------+--------+---------+--------+  Proximal Stent 144             triphasic          +---------------+--------+--------+---------+--------+  Mid Stent      116             triphasic          +---------------+--------+--------+---------+--------+  Distal Stent   159             triphasic          +---------------+--------+--------+---------+--------+  Distal to Stent114             triphasic          +---------------+--------+--------+---------+--------+   Summary:  Right: Patent stent with no evidence of stenosis in the superficial femoral artery artery.   Previous ABI's/TBI's on 12/03/2021: Right:  1.10/0.60 - Great toe pressure: 84 Left:  1.16/0.51 - Great toe pressure:  71  Previous arterial duplex on 12/03/2021: +----------+--------+-----+--------+-----------+--------+  RIGHT    PSV cm/sRatioStenosisWaveform   Comments  +----------+--------+-----+--------+-----------+--------+  CFA Distal109                  monophasic           +----------+--------+-----+--------+-----------+--------+  DFA      94                   monophasic           +----------+--------+-----+--------+-----------+--------+  SFA Prox  163                  multiphasic          +----------+--------+-----+--------+-----------+--------+  SFA Mid   139  multiphasic          +----------+--------+-----+--------+-----------+--------+  SFA Distal78                   multiphasic          +----------+--------+-----+--------+-----------+--------+  POP Prox  118                  multiphasic          +----------+--------+-----+--------+-----------+--------+  POP Distal101                  multiphasic          +----------+--------+-----+--------+-----------+--------+   Right Stent(s):   +---------------+--------+--------+-----------+--------+  prox SFA       PSV cm/sStenosisWaveform   Comments  +---------------+--------+--------+-----------+--------+  Prox to Stent  163             multiphasic          +---------------+--------+--------+-----------+--------+  Proximal Stent 164             multiphasic          +---------------+--------+--------+-----------+--------+  Mid Stent      148             multiphasic          +---------------+--------+--------+-----------+--------+  Distal Stent   135             multiphasic          +---------------+--------+--------+-----------+--------+  Distal to [id number]             multiphasic          +---------------+--------+--------+-----------+--------+   Summary:  Right: Patent right proximal SFA stent with no stenosis visualized.  Unable to interrogate the distal tibial vessels due to bandages.     ASSESSMENT/PLAN:: 71 y.o. female here for follow up for PAD with hx of angiogram with drug eluting stent of the right SFA 08/25/2021 by Dr. Karin Lieu for CLI.  She had gangrene of the right 3rd toe and underwent toe amputation by Dr. Lilian Kapur 11/11/2021.     -pt doing well without claudication, rest pain or non healing wounds.  She has palpable DP pulses bilaterally. -continue asa/statin/plavix  BLE swelling. -most likely has chronic venous insufficiency with some hemosiderin staining.  She is retired from elastic therapy.  She is going to start wearing her 15-58mmHg knee high compression socks as these did give her relief in the past.  I did give her handout with recommendations for leg swelling.   Discussed if this worsened, we could get a venous duplex study.  She expressed understanding.  -pt will f/u in one year with RLE arterial duplex and ABI  with PA or Dr. Karin Lieu.  She will call sooner if any issues before then.   Doreatha Massed, [facility] [phone]  Clinic MD:    Karin Lieu on call MD

## 2022-12-16 ENCOUNTER — Ambulatory Visit (INDEPENDENT_AMBULATORY_CARE_PROVIDER_SITE_OTHER)
Admission: RE | Admit: 2022-12-16 | Discharge: 2022-12-16 | Disposition: A | Discharge status: Home / Self Care | Payer: Medicare HMO | Source: Ambulatory Visit | Attending: Vascular Surgery | Admitting: Vascular Surgery

## 2022-12-16 ENCOUNTER — Ambulatory Visit: Payer: Medicare HMO | Admitting: Physician Assistant

## 2022-12-16 ENCOUNTER — Ambulatory Visit (HOSPITAL_COMMUNITY)
Admission: RE | Admit: 2022-12-16 | Discharge: 2022-12-16 | Disposition: A | Discharge status: Home / Self Care | Payer: Medicare HMO | Source: Ambulatory Visit | Attending: Vascular Surgery | Admitting: Vascular Surgery

## 2022-12-16 ENCOUNTER — Encounter: Payer: Self-pay | Admitting: Physician Assistant

## 2022-12-16 VITALS — BP 128/88 | HR 87 | Temp 98.5°F | Wt 143.0 lb

## 2022-12-16 DIAGNOSIS — M7989 Other specified soft tissue disorders: Secondary | ICD-10-CM | POA: Diagnosis not present

## 2022-12-16 DIAGNOSIS — I75021 Atheroembolism of right lower extremity: Secondary | ICD-10-CM | POA: Diagnosis not present

## 2022-12-16 DIAGNOSIS — I70261 Atherosclerosis of native arteries of extremities with gangrene, right leg: Secondary | ICD-10-CM | POA: Insufficient documentation

## 2022-12-16 DIAGNOSIS — I739 Peripheral vascular disease, unspecified: Secondary | ICD-10-CM | POA: Diagnosis not present

## 2022-12-16 LAB — VAS US ABI WITH/WO TBI
Left ABI: 1.27
Right ABI: 1.06

## 2022-12-17 ENCOUNTER — Other Ambulatory Visit: Payer: Self-pay

## 2022-12-17 DIAGNOSIS — I739 Peripheral vascular disease, unspecified: Secondary | ICD-10-CM

## 2023-06-01 IMAGING — CT CT ANGIO AOBIFEM WO/W CM
1 of 16 series · 5 of 29 positions shown · non-contrast
Comparison: 03/02/2011

CLINICAL DATA: Peripheral arterial disease. Blue toe syndrome.
Lower extremity swelling.

EXAM:
CT ANGIOGRAPHY OF ABDOMINAL AORTA WITH ILIOFEMORAL RUNOFF
CT ANGIOGRAPHY OF CHEST
TECHNIQUE: Multidetector CT imaging of the chest, abdomen, pelvis and lower
extremities was performed using the standard protocol during bolus
administration of intravenous contrast. Multiplanar CT image
reconstructions and MIPs were obtained to evaluate the vascular
anatomy.

[Series 4: cta chest ao-bifem 2.00 bv36 s3 axial 2 mm · axial · 0.72mm/px · z∈[+685,+1573]mm · 5 of 740 slices shown]
[im 148/740  lung]
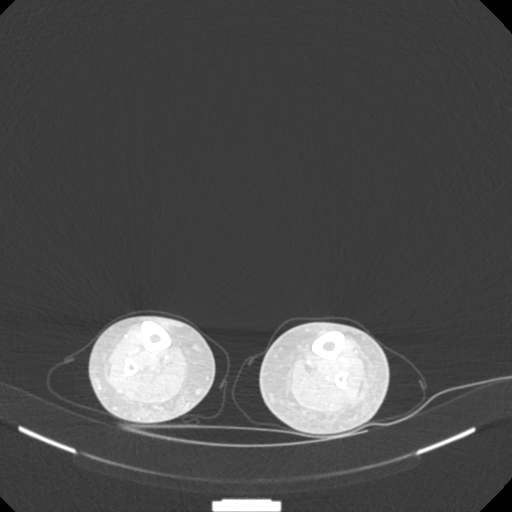
[im 296/740  mediastinal]
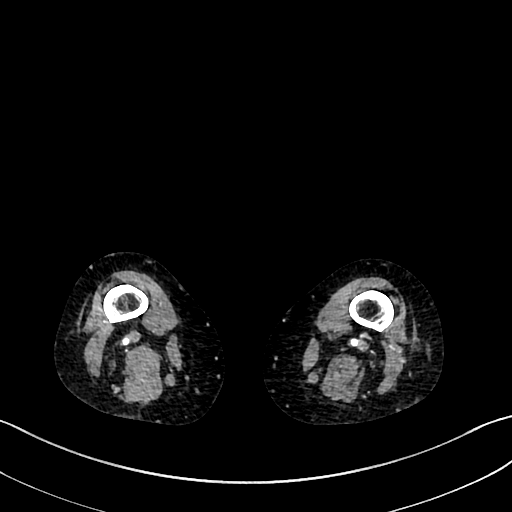
[im 366/740  lung]
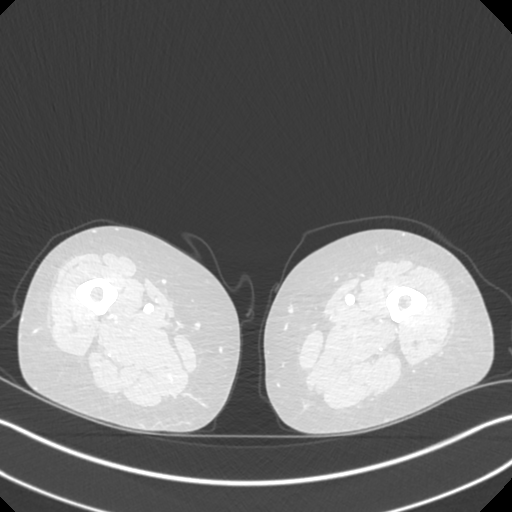
[im 444/740  mediastinal]
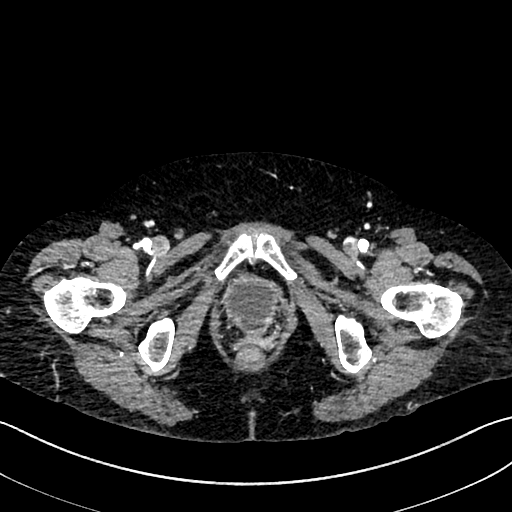
[im 592/740  lung]
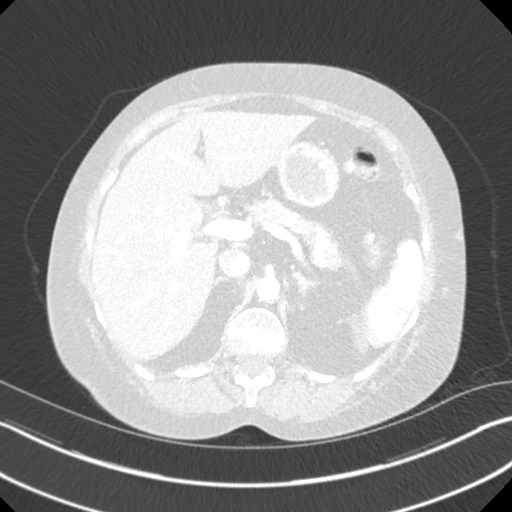

[5 of 29 positions shown; findings below may reference images not displayed]

RADIATION DOSE REDUCTION: This exam was performed according to the
departmental dose-optimization program which includes automated
exposure control, adjustment of the mA and/or kV according to
patient size and/or use of iterative reconstruction technique.

CONTRAST:  100mL N72KPA-BX5 IOPAMIDOL (N72KPA-BX5) INJECTION 76%
FINDINGS: VASCULAR

Aorta: Normal caliber of the thoracic aorta with mild
atherosclerotic disease. Normal caliber of the abdominal aorta with
circumferential atherosclerotic calcifications but no evidence for
aneurysm, dissection or significant stenosis.

Great vessels: Bovine arch with a common origin for the left common
carotid artery and innominate artery. Atherosclerotic plaque at the
bilateral carotid artery bifurcations and cannot assess degree of
stenosis. Bilateral subclavian arteries are patent.

Pulmonary arteries: Main pulmonary arteries are patent.

Cardiac: Heart size is normal. Coronary artery calcifications. No
significant pericardial fluid.

Celiac: Patent without evidence of aneurysm, dissection, vasculitis
or significant stenosis.

SMA: Patent without evidence of aneurysm, dissection, vasculitis or
significant stenosis.

Renals: Both renal arteries are patent without evidence of aneurysm,
dissection, vasculitis, fibromuscular dysplasia or significant
stenosis.

IMA: Small and patent.

RIGHT Lower Extremity

Inflow: Common, internal and external iliac arteries are patent
without evidence of aneurysm, dissection, vasculitis or significant
stenosis.

Outflow: Right common femoral artery is patent. Right profunda
femoral arteries are patent. Diffuse calcifications in the right SFA
without focal occlusion or significant stenosis. Right popliteal
artery is patent with areas of mild narrowing due to mixed plaque.

Runoff: Patent three vessel runoff to the ankle.

LEFT Lower Extremity

Inflow: Common, internal and external iliac arteries are patent
without evidence of aneurysm, dissection, vasculitis or significant
stenosis.

Outflow: Calcified plaque in left common femoral artery without
significant stenosis. Left profunda femoral arteries are patent.
Areas of mild stenosis in left SFA without focal occlusion. Left
popliteal artery is patent with areas of mild narrowing.

Runoff: Patent three vessel runoff to the ankle.

Veins: Prominent superficial veins in both lower extremities. No
significant enlargement of the great saphenous veins. Bilateral
renal veins are patent. Main portal veins are patent.

Review of the MIP images confirms the above findings.

NON-VASCULAR

Mediastinal structures: Hiatal hernia has enlarged and moderate in
size. No mediastinal, hilar or axillary lymph node enlargement.
Thyroid tissue is unremarkable. No axillary lymph node enlargement.
Surgical clips in the right axilla.

Lungs: Trachea and mainstem bronchi are patent. No pleural
effusions. Subtle patchy densities in the right middle lobe region
could represent atelectasis or mild scarring. No large areas of
airspace disease or consolidation in lungs.

Hepatobiliary: Normal appearance of the liver. At least 1 small
calcified gallstone. Gallbladder is decompressed without
inflammatory changes.

Pancreas: Unremarkable. No pancreatic ductal dilatation or
surrounding inflammatory changes.

Spleen: Normal in size without focal abnormality.

Adrenals/Urinary Tract: Normal adrenal glands. Small low densities
in the kidneys probably represent small cysts but too small to
definitively characterize. No suspicious renal lesions. Negative for
hydronephrosis. Mild distention of the urinary bladder.

Stomach/Bowel: Moderate sized hiatal hernia. Duodenal diverticulum
near the pancreatic head. Surgical anastomosis in the upper rectal
region. Scattered colonic diverticula. No evidence for bowel
obstruction or focal bowel inflammation.

Lymphatic: No lymph node enlargement in the abdomen or pelvis.

Reproductive: Status post hysterectomy. No adnexal masses.

Other: There appears to be some pelvic floor laxity. Right inguinal
hernia containing a small portion of small bowel. Negative for free
fluid. Negative for free air. Evidence for small complex ventral
hernias along the anterior abdominal wall containing fat.

Musculoskeletal: Old bilateral rib fractures. Vertebral body heights
in the thoracic and lumbar spine are maintained. Degenerative disc
and endplate changes in the lower cervical spine. Subcutaneous edema
in lower extremities, left side greater than right.
IMPRESSION: VASCULAR

1. Mild atherosclerotic disease throughout the chest, abdomen,
pelvis and bilateral lower extremities. No significant stenosis or
occlusive disease involving the inflow, outflow or runoff vessels.
Areas of mild narrowing in the outflow vessels as described.
2. Prominent superficial veins in bilateral lower extremities.
Consider ultrasound evaluation for venous insufficiency.
3. Atherosclerotic disease at the carotid artery bifurcations are
poorly characterized on this examination. Consider correlation with
carotid artery duplex.
4. Coronary artery calcifications.

NON-VASCULAR

1. No acute abnormality in the chest, abdomen or pelvis.
2. Cholelithiasis without acute inflammatory changes.
3. Right inguinal hernia containing a small portion of bowel. No
evidence for bowel obstruction or inflammatory changes.
4. Multiple small ventral hernias containing fat.
5. Postoperative changes in the upper rectum without complicating
features.
6. Moderate sized hiatal hernia.

## 2023-06-01 IMAGING — CT CT ANGIO CHEST
1 of 16 series · 5 of 29 positions shown · non-contrast
Comparison: 03/02/2011

CLINICAL DATA: Peripheral arterial disease. Blue toe syndrome.
Lower extremity swelling.

EXAM:
CT ANGIOGRAPHY OF ABDOMINAL AORTA WITH ILIOFEMORAL RUNOFF
CT ANGIOGRAPHY OF CHEST
TECHNIQUE: Multidetector CT imaging of the chest, abdomen, pelvis and lower
extremities was performed using the standard protocol during bolus
administration of intravenous contrast. Multiplanar CT image
reconstructions and MIPs were obtained to evaluate the vascular
anatomy.

[Series 4: cta chest ao-bifem 2.00 bv36 s3 axial 2 mm · axial · 0.72mm/px · z∈[+685,+1573]mm · 5 of 740 slices shown]
[im 148/740  lung]
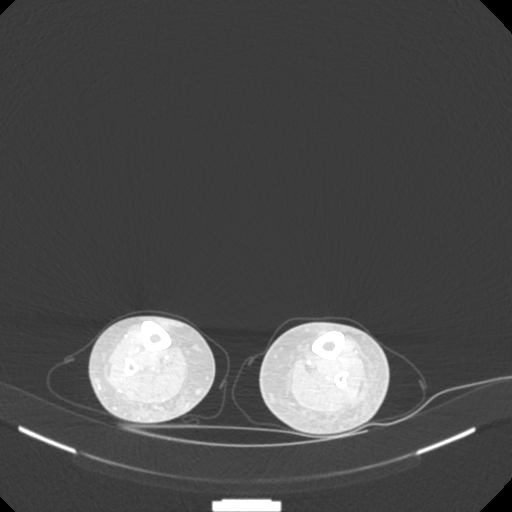
[im 296/740  mediastinal]
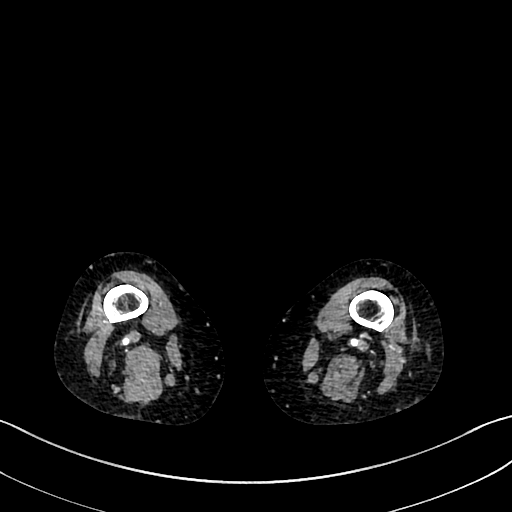
[im 366/740  lung]
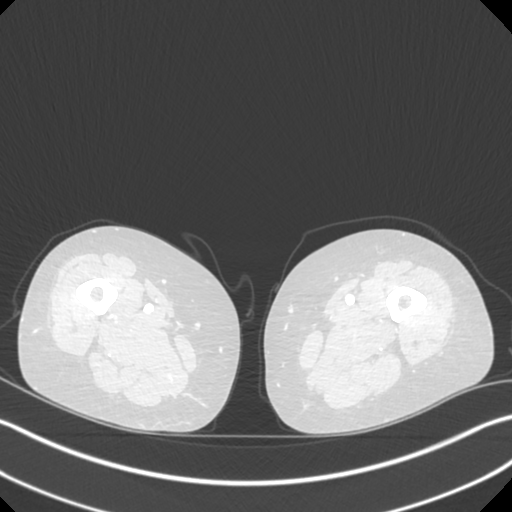
[im 444/740  mediastinal]
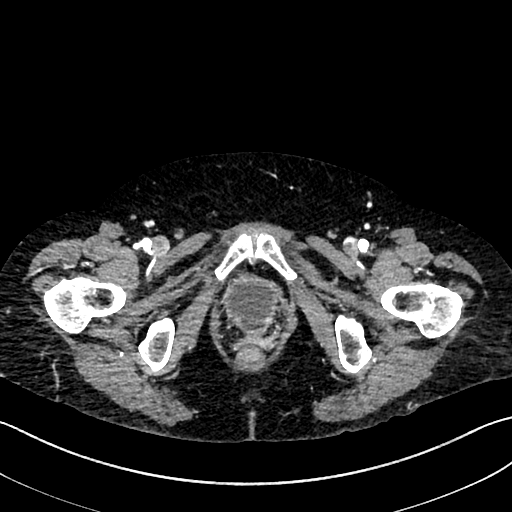
[im 592/740  lung]
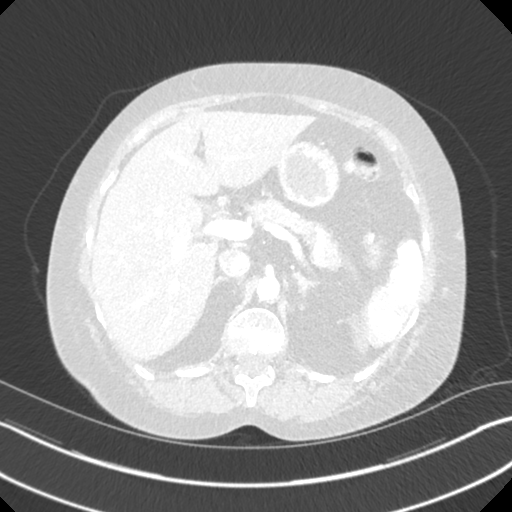

[5 of 29 positions shown; findings below may reference images not displayed]

RADIATION DOSE REDUCTION: This exam was performed according to the
departmental dose-optimization program which includes automated
exposure control, adjustment of the mA and/or kV according to
patient size and/or use of iterative reconstruction technique.

CONTRAST:  100mL N72KPA-BX5 IOPAMIDOL (N72KPA-BX5) INJECTION 76%
FINDINGS: VASCULAR

Aorta: Normal caliber of the thoracic aorta with mild
atherosclerotic disease. Normal caliber of the abdominal aorta with
circumferential atherosclerotic calcifications but no evidence for
aneurysm, dissection or significant stenosis.

Great vessels: Bovine arch with a common origin for the left common
carotid artery and innominate artery. Atherosclerotic plaque at the
bilateral carotid artery bifurcations and cannot assess degree of
stenosis. Bilateral subclavian arteries are patent.

Pulmonary arteries: Main pulmonary arteries are patent.

Cardiac: Heart size is normal. Coronary artery calcifications. No
significant pericardial fluid.

Celiac: Patent without evidence of aneurysm, dissection, vasculitis
or significant stenosis.

SMA: Patent without evidence of aneurysm, dissection, vasculitis or
significant stenosis.

Renals: Both renal arteries are patent without evidence of aneurysm,
dissection, vasculitis, fibromuscular dysplasia or significant
stenosis.

IMA: Small and patent.

RIGHT Lower Extremity

Inflow: Common, internal and external iliac arteries are patent
without evidence of aneurysm, dissection, vasculitis or significant
stenosis.

Outflow: Right common femoral artery is patent. Right profunda
femoral arteries are patent. Diffuse calcifications in the right SFA
without focal occlusion or significant stenosis. Right popliteal
artery is patent with areas of mild narrowing due to mixed plaque.

Runoff: Patent three vessel runoff to the ankle.

LEFT Lower Extremity

Inflow: Common, internal and external iliac arteries are patent
without evidence of aneurysm, dissection, vasculitis or significant
stenosis.

Outflow: Calcified plaque in left common femoral artery without
significant stenosis. Left profunda femoral arteries are patent.
Areas of mild stenosis in left SFA without focal occlusion. Left
popliteal artery is patent with areas of mild narrowing.

Runoff: Patent three vessel runoff to the ankle.

Veins: Prominent superficial veins in both lower extremities. No
significant enlargement of the great saphenous veins. Bilateral
renal veins are patent. Main portal veins are patent.

Review of the MIP images confirms the above findings.

NON-VASCULAR

Mediastinal structures: Hiatal hernia has enlarged and moderate in
size. No mediastinal, hilar or axillary lymph node enlargement.
Thyroid tissue is unremarkable. No axillary lymph node enlargement.
Surgical clips in the right axilla.

Lungs: Trachea and mainstem bronchi are patent. No pleural
effusions. Subtle patchy densities in the right middle lobe region
could represent atelectasis or mild scarring. No large areas of
airspace disease or consolidation in lungs.

Hepatobiliary: Normal appearance of the liver. At least 1 small
calcified gallstone. Gallbladder is decompressed without
inflammatory changes.

Pancreas: Unremarkable. No pancreatic ductal dilatation or
surrounding inflammatory changes.

Spleen: Normal in size without focal abnormality.

Adrenals/Urinary Tract: Normal adrenal glands. Small low densities
in the kidneys probably represent small cysts but too small to
definitively characterize. No suspicious renal lesions. Negative for
hydronephrosis. Mild distention of the urinary bladder.

Stomach/Bowel: Moderate sized hiatal hernia. Duodenal diverticulum
near the pancreatic head. Surgical anastomosis in the upper rectal
region. Scattered colonic diverticula. No evidence for bowel
obstruction or focal bowel inflammation.

Lymphatic: No lymph node enlargement in the abdomen or pelvis.

Reproductive: Status post hysterectomy. No adnexal masses.

Other: There appears to be some pelvic floor laxity. Right inguinal
hernia containing a small portion of small bowel. Negative for free
fluid. Negative for free air. Evidence for small complex ventral
hernias along the anterior abdominal wall containing fat.

Musculoskeletal: Old bilateral rib fractures. Vertebral body heights
in the thoracic and lumbar spine are maintained. Degenerative disc
and endplate changes in the lower cervical spine. Subcutaneous edema
in lower extremities, left side greater than right.
IMPRESSION: VASCULAR

1. Mild atherosclerotic disease throughout the chest, abdomen,
pelvis and bilateral lower extremities. No significant stenosis or
occlusive disease involving the inflow, outflow or runoff vessels.
Areas of mild narrowing in the outflow vessels as described.
2. Prominent superficial veins in bilateral lower extremities.
Consider ultrasound evaluation for venous insufficiency.
3. Atherosclerotic disease at the carotid artery bifurcations are
poorly characterized on this examination. Consider correlation with
carotid artery duplex.
4. Coronary artery calcifications.

NON-VASCULAR

1. No acute abnormality in the chest, abdomen or pelvis.
2. Cholelithiasis without acute inflammatory changes.
3. Right inguinal hernia containing a small portion of bowel. No
evidence for bowel obstruction or inflammatory changes.
4. Multiple small ventral hernias containing fat.
5. Postoperative changes in the upper rectum without complicating
features.
6. Moderate sized hiatal hernia.

## 2023-09-29 ENCOUNTER — Ambulatory Visit (INDEPENDENT_AMBULATORY_CARE_PROVIDER_SITE_OTHER): Admitting: Podiatry

## 2023-09-29 DIAGNOSIS — L02611 Cutaneous abscess of right foot: Secondary | ICD-10-CM

## 2023-09-29 DIAGNOSIS — L03115 Cellulitis of right lower limb: Secondary | ICD-10-CM

## 2023-09-29 MED ORDER — AMOXICILLIN-POT CLAVULANATE 875-125 MG PO TABS: 1.0000 | ORAL_TABLET | Freq: Two times a day (BID) | ORAL | 0 refills | Status: AC

## 2023-09-29 NOTE — Progress Notes (Signed)
 Chief Complaint  Patient presents with   Callouses    Here today for right foot corns. There are 5 spots on the plantar. 3 of them look like one giant blister with fluid under the skin. She did soak her foot in hot water and baking soda and epsom salts. She also used mupirocin  2% on it.  Diabetic with last A1c 6.9, and takes ASA and Plavix    HPI: 73 y.o. female presents today with concern of calluses on the plantar aspect of her right foot.  As she feels like there might be a blister near the calluses.  She is unable to attend to it herself at home.  She does have mupirocin  ointment at home and she also has sterile dressing materials at home.  Denies fever.  Past Medical History:  Diagnosis Date   Arthritis    joints   Asthma    allergic   Breast cancer (HCC)    Gastric mass 05/24/2012   GERD (gastroesophageal reflux disease)    H/O hiatal hernia    Heart murmur    not MVP no treatment   Hyperlipidemia    Hypertension    Insomnia    Osteopenia    Osteoporosis    PONV (postoperative nausea and vomiting)    Rosacea    neck,cheeks    Past Surgical History:  Procedure Laterality Date   ABDOMINAL AORTOGRAM W/LOWER EXTREMITY Right 08/25/2021   Procedure: ABDOMINAL AORTOGRAM W/LOWER EXTREMITY;  Surgeon: Kayla Part, MD;  Location: [facility] INVASIVE CV LAB;  Service: Cardiovascular;  Laterality: Right;   ABDOMINAL HYSTERECTOMY     AMPUTATION TOE Right 11/11/2021   Procedure: AMPUTATION TOE;  Surgeon: Floyce Hutching, DPM;  Location: [facility] OR;  Service: Podiatry;  Laterality: Right;  third toe   Bladder Tack     Blader Tack     BREAST LUMPECTOMY  04-1999   BREAST LUMPECTOMY  05-1999   COLON SURGERY  07-2010   COLONOSCOPY W/ POLYPECTOMY     EDG  with polypectomy (gastric polyps)     EUS  05/24/2012   Procedure: UPPER ENDOSCOPIC ULTRASOUND (EUS) LINEAR;  Surgeon: Janel Medford, MD;  Location: [facility] ENDOSCOPY;  Service: Endoscopy;  Laterality: N/A;   Lumpectomy 07-2000  2002   Port a  cath Insertion  2001   PORT-A-CATH REMOVAL  05-2000   TONSILLECTOMY     WOUND DEBRIDEMENT Right 11/11/2021   Procedure: DEBRIDEMENT WOUND;  Surgeon: Floyce Hutching, DPM;  Location: [facility] OR;  Service: Podiatry;  Laterality: Right;    Allergies  Allergen Reactions   Latex Itching   Levofloxacin Other (See Comments)    Possible tendon rupture   Morphine And Codeine Nausea Only    Physical Exam: Palpable pedal pulses noted.  There are hyperkeratotic lesions right submet 5 and then there is a cluster of 3 hyperkeratotic lesions just proximal and medial to this.  Beneath these 3 clustered hyperkeratotic lesion, there is a large abscess noted.  This is at least 3 cm long by 1.5 cm wide.  There is surrounding erythema and pain on palpation.  There is localized calor noted.  No pain in the plantar instep or medial ankle.  No ascending cellulitis or lymphangitis is noted.  Assessment/Plan of Care: 1. Abscess of right foot   2. Cellulitis of right foot     Meds ordered this encounter  Medications   amoxicillin -clavulanate (AUGMENTIN ) 875-125 MG tablet    Sig: Take 1 tablet by mouth  2 (two) times daily.    Dispense:  20 tablet    Refill:  0   Discussed clinical findings with patient today.  With the patient's consent, the area was a sterile skin prep performed around the abscess.  A sterile #313 blade was utilized to make an incision in the central portion of the abscess and approximately 2 cc of purulent material was expressed.  This was swabbed for culture.  The area was cleansed with normal sterile saline.  Iodine  solution followed by sterile gauze dressing was applied.  Patient was fitted for surgical shoe and will wear this at all times weightbearing over the next week.  She will change this daily and cleanse with soap and water and perform Epsom salt soaks daily until follow-up.  Prescription for Augmentin  sent to her pharmacy.  F/u one week   Marvetta Vohs DEstle Hemp, DPM, FACFAS [facility]     [address] [place], [place] [address]                Office [phone]  Fax [phone]

## 2023-10-06 ENCOUNTER — Ambulatory Visit (INDEPENDENT_AMBULATORY_CARE_PROVIDER_SITE_OTHER): Admitting: Podiatry

## 2023-10-06 DIAGNOSIS — L02611 Cutaneous abscess of right foot: Secondary | ICD-10-CM

## 2023-10-06 NOTE — Progress Notes (Signed)
 Chief Complaint  Patient presents with   Wound Check    Right foot wound. Looks a little better, she did not put the betadine on it today so we could see it.  She scratched one spot on it and it looks a little open. It was covered. Last A1c 6.9. takes Plavix  and ASA 81   HPI: 73 y.o. female presents today for follow-up of right plantar forefoot abscess.  This was drained in the office last week and swabbed for culture.  We did receive the culture report back this morning noted no bacterial colonization.  She is still taking her oral antibiotics.  She feels that the antibiotic has been helpful because after a couple doses, she feels that the majority of the pain was gone by the weekend.  She has been applying iodine  to the area.  Past Medical History:  Diagnosis Date   Arthritis    joints   Asthma    allergic   Breast cancer (HCC)    Gastric mass 05/24/2012   GERD (gastroesophageal reflux disease)    H/O hiatal hernia    Heart murmur    not MVP no treatment   Hyperlipidemia    Hypertension    Insomnia    Osteopenia    Osteoporosis    PONV (postoperative nausea and vomiting)    Rosacea    neck,cheeks    Past Surgical History:  Procedure Laterality Date   ABDOMINAL AORTOGRAM W/LOWER EXTREMITY Right 08/25/2021   Procedure: ABDOMINAL AORTOGRAM W/LOWER EXTREMITY;  Surgeon: Kayla Part, MD;  Location: [facility] INVASIVE CV LAB;  Service: Cardiovascular;  Laterality: Right;   ABDOMINAL HYSTERECTOMY     AMPUTATION TOE Right 11/11/2021   Procedure: AMPUTATION TOE;  Surgeon: Floyce Hutching, DPM;  Location: [facility] OR;  Service: Podiatry;  Laterality: Right;  third toe   Bladder Tack     Blader Tack     BREAST LUMPECTOMY  04-1999   BREAST LUMPECTOMY  05-1999   COLON SURGERY  07-2010   COLONOSCOPY W/ POLYPECTOMY     EDG  with polypectomy (gastric polyps)     EUS  05/24/2012   Procedure: UPPER ENDOSCOPIC ULTRASOUND (EUS) LINEAR;  Surgeon: Janel Medford, MD;  Location: [facility] ENDOS[facility];   Service: Endoscopy;  Laterality: N/A;   Lumpectomy 07-2000  2002   Port a cath Insertion  2001   PORT-A-CATH REMOVAL  05-2000   TONSILLECTOMY     WOUND DEBRIDEMENT Right 11/11/2021   Procedure: DEBRIDEMENT WOUND;  Surgeon: Floyce Hutching, DPM;  Location: [facility] OR;  Service: Podiatry;  Laterality: Right;    Allergies  Allergen Reactions   Latex Itching   Levofloxacin Other (See Comments)    Possible tendon rupture   Morphine And Codeine Nausea Only    ROS    Physical Exam: There are palpable pedal pulses.  No edema is noted to the right foot.  No erythema is noted to the plantar aspect of the right forefoot.  The deroofed abscess on the plantar aspect of the right forefoot is healing well and is drying appropriately.  There is intact skin revealed.  Assessment/Plan of Care: 1. Abscess of right foot     A sterile #313 blade was utilized to clean up the peeling and flaky skin in the area of the previous abscess.  Reviewed the culture report with the patient today.  She wants to go ahead and finish out her antibiotics because she felt it was very helpful.  Will  have her follow-up as needed   Joe Murders, DPM, FACFAS [facility]     [address] [place], [place] [address]                Office [phone]  Fax [phone]

## 2023-12-21 ENCOUNTER — Other Ambulatory Visit: Payer: Self-pay | Admitting: Vascular Surgery

## 2023-12-21 DIAGNOSIS — M7989 Other specified soft tissue disorders: Secondary | ICD-10-CM

## 2023-12-21 DIAGNOSIS — I70261 Atherosclerosis of native arteries of extremities with gangrene, right leg: Secondary | ICD-10-CM

## 2023-12-21 DIAGNOSIS — I75021 Atheroembolism of right lower extremity: Secondary | ICD-10-CM

## 2023-12-21 DIAGNOSIS — I739 Peripheral vascular disease, unspecified: Secondary | ICD-10-CM

## 2024-01-04 ENCOUNTER — Ambulatory Visit (HOSPITAL_COMMUNITY)
Admission: RE | Admit: 2024-01-04 | Discharge: 2024-01-04 | Disposition: A | Discharge status: Home / Self Care | Source: Ambulatory Visit | Attending: Vascular Surgery | Admitting: Vascular Surgery

## 2024-01-04 ENCOUNTER — Ambulatory Visit (HOSPITAL_BASED_OUTPATIENT_CLINIC_OR_DEPARTMENT_OTHER)
Admission: RE | Admit: 2024-01-04 | Discharge: 2024-01-04 | Disposition: A | Discharge status: Home / Self Care | Source: Ambulatory Visit | Attending: Vascular Surgery | Admitting: Vascular Surgery

## 2024-01-04 DIAGNOSIS — I739 Peripheral vascular disease, unspecified: Secondary | ICD-10-CM

## 2024-01-04 DIAGNOSIS — M7989 Other specified soft tissue disorders: Secondary | ICD-10-CM | POA: Insufficient documentation

## 2024-01-04 DIAGNOSIS — I70261 Atherosclerosis of native arteries of extremities with gangrene, right leg: Secondary | ICD-10-CM | POA: Insufficient documentation

## 2024-01-04 DIAGNOSIS — I75021 Atheroembolism of right lower extremity: Secondary | ICD-10-CM | POA: Diagnosis present

## 2024-01-04 LAB — VAS US ABI WITH/WO TBI
Left ABI: 1.06
Right ABI: 1.13

## 2024-01-10 ENCOUNTER — Ambulatory Visit: Admitting: Podiatry

## 2024-01-10 ENCOUNTER — Ambulatory Visit (INDEPENDENT_AMBULATORY_CARE_PROVIDER_SITE_OTHER)

## 2024-01-10 DIAGNOSIS — L02611 Cutaneous abscess of right foot: Secondary | ICD-10-CM

## 2024-01-10 DIAGNOSIS — R601 Generalized edema: Secondary | ICD-10-CM | POA: Diagnosis not present

## 2024-01-10 DIAGNOSIS — L84 Corns and callosities: Secondary | ICD-10-CM

## 2024-01-10 MED ORDER — MUPIROCIN 2 % EX OINT: TOPICAL_OINTMENT | CUTANEOUS | 1 refills | Status: AC

## 2024-01-10 NOTE — Progress Notes (Signed)
 Chief Complaint  Patient presents with   Foot Pain    Right foot, 2nd toe inflammation. Last A1c  6.6 and takes Plavix  and ASA.  The toe is puffy around the base of the toe and into the knuckle, there is a spot on the the knuckle, but it is not open. She had her PCP treating a sore on the top of the foot it just has a tiny scab on it and looks healed.     HPI: 73 y.o. female presents today with concern of a possible wound to the top of the right foot near the second toe.  She does acknowledge issues with constant edema/swelling in her feet.  She does state that she wears compression stockings.  She states that it already looks better from when her PCP evaluated her.  She also is requesting the right submet 5 callus be shaved today.  Past Medical History:  Diagnosis Date   Arthritis    joints   Asthma    allergic   Breast cancer (HCC)    Gastric mass 05/24/2012   GERD (gastroesophageal reflux disease)    H/O hiatal hernia    Heart murmur    not MVP no treatment   Hyperlipidemia    Hypertension    Insomnia    Osteopenia    Osteoporosis    PONV (postoperative nausea and vomiting)    Rosacea    neck,cheeks   Past Surgical History:  Procedure Laterality Date   ABDOMINAL AORTOGRAM W/LOWER EXTREMITY Right 08/25/2021   Procedure: ABDOMINAL AORTOGRAM W/LOWER EXTREMITY;  Surgeon: Lanis Fonda BRAVO, MD;  Location: [facility] INVASIVE CV LAB;  Service: Cardiovascular;  Laterality: Right;   ABDOMINAL HYSTERECTOMY     AMPUTATION TOE Right 11/11/2021   Procedure: AMPUTATION TOE;  Surgeon: Silva Juliene SAUNDERS, DPM;  Location: [facility] OR;  Service: Podiatry;  Laterality: Right;  third toe   Bladder Tack     Blader Tack     BREAST LUMPECTOMY  04-1999   BREAST LUMPECTOMY  05-1999   COLON SURGERY  07-2010   COLONOSCOPY W/ POLYPECTOMY     EDG  with polypectomy (gastric polyps)     EUS  05/24/2012   Procedure: UPPER ENDOSCOPIC ULTRASOUND (EUS) LINEAR;  Surgeon: Toribio SHAUNNA Cedar, MD;  Location: [facility] ENDOS[facility];   Service: Endoscopy;  Laterality: N/A;   Lumpectomy 07-2000  2002   Port a cath Insertion  2001   PORT-A-CATH REMOVAL  05-2000   TONSILLECTOMY     WOUND DEBRIDEMENT Right 11/11/2021   Procedure: DEBRIDEMENT WOUND;  Surgeon: Silva Juliene SAUNDERS, DPM;  Location: [facility] OR;  Service: Podiatry;  Laterality: Right;   Allergies  Allergen Reactions   Latex Itching   Levofloxacin Other (See Comments)    Possible tendon rupture   Morphine And Codeine Nausea Only    Physical Exam: Palpable pedal pulses.  +1 pitting edema to the lower leg and feet.  The dorsal right foot just proximal to the second MPJ, has a circular, approximately 4 mm diameter crusty lesion present.  This is loose.  Upon removal of the loose skin, a small amount of serous drainage expressed.  This was the type of drainage typically seen with blistering edema of the legs.  No cellulitis was appreciated.  The foot has no calor present.  No rubor present.  Assessment/Plan of Care: 1. Abscess of right foot   2. Generalized edema      Meds ordered this encounter  Medications   mupirocin  ointment (BACTROBAN )  2 %    Sig: Apply thin layer to wound on top of right foot and cover with a Band-Aid.  Change once daily.    Dispense:  22 g    Refill:  1   Patient advised to elevate her legs at least twice daily for 30 minutes above the level of her heart at home.  She will continue with her compression stockings at all times when awake.  Prescription for Bactroban  2% ointment sent to her pharmacy to begin applying to the small draining spot on the dorsal aspect of the right forefoot.  Keep covered with a Band-Aid.  Once this has stopped draining, she can discontinue care.  Follow-up as needed   Awanda CHARM Imperial, DPM, FACFAS [facility]     [address] [place], [place] [address]                Office [phone]  Fax [phone]

## 2024-01-11 ENCOUNTER — Ambulatory Visit

## 2024-02-07 NOTE — Progress Notes (Unsigned)
 HISTORY AND PHYSICAL     CC:  follow up. Requesting Provider:  Jama Chow, MD  HPI: This is a 73 y.o. female who is here today for follow up for PAD.  Pt has hx of  angiogram with drug eluting stent of the right SFA 08/25/2021 by Dr. Lanis for CLI.  She had gangrene of the right 3rd toe and underwent toe amputation by Dr. Silva 11/11/2021.    Pt was last seen 12/16/2022 and at that time, she was not having any claudication, rest pain or non healing wounds.  She did have BLE swelling with skin color changes.  She was retired from [employer] and had worn compression in the past that did help her swelling.  She had palpable DP pulses bilaterally.  The pt returns today for follow up.  She denies any claudication or rest pain.  She does have a couple of small areas on the dorsum of the right foot that have improved but the lateral one drained but has healed.  She does have a corn on the plantar aspect of the right foot.  She states that her podiatrist lanced this earlier this year and did a culture but it was negative.  She denies any fever/chills.  She does continue to have leg swelling and discoloration of bilateral lower legs.  She does wear compression and elevates her legs.  She states that her wedge pillow deflated and has a new one that she does not feel gets her feet high enough.  She does a lot of standing.     The pt is on a statin for cholesterol management.    The pt is on an aspirin .    Other AC:  Plavix  The pt is on diuretic, ARB for hypertension.  The pt is not on diabetic medication. Tobacco hx:  never  Pt does not have family hx of AAA on her mother's side.  She does not know her father's family hx.   Past Medical History:  Diagnosis Date   Arthritis    joints   Asthma    allergic   Breast cancer (HCC)    Gastric mass 05/24/2012   GERD (gastroesophageal reflux disease)    H/O hiatal hernia    Heart murmur    not MVP no treatment   Hyperlipidemia    Hypertension     Insomnia    Osteopenia    Osteoporosis    PONV (postoperative nausea and vomiting)    Rosacea    neck,cheeks    Past Surgical History:  Procedure Laterality Date   ABDOMINAL AORTOGRAM W/LOWER EXTREMITY Right 08/25/2021   Procedure: ABDOMINAL AORTOGRAM W/LOWER EXTREMITY;  Surgeon: Lanis Fonda BRAVO, MD;  Location: [facility] INVASIVE CV LAB;  Service: Cardiovascular;  Laterality: Right;   ABDOMINAL HYSTERECTOMY     AMPUTATION TOE Right 11/11/2021   Procedure: AMPUTATION TOE;  Surgeon: Silva Juliene SAUNDERS, DPM;  Location: [facility] OR;  Service: Podiatry;  Laterality: Right;  third toe   Bladder Tack     Blader Tack     BREAST LUMPECTOMY  04-1999   BREAST LUMPECTOMY  05-1999   COLON SURGERY  07-2010   COLONOSCOPY W/ POLYPECTOMY     EDG  with polypectomy (gastric polyps)     EUS  05/24/2012   Procedure: UPPER ENDOSCOPIC ULTRASOUND (EUS) LINEAR;  Surgeon: Toribio SHAUNNA Cedar, MD;  Location: [facility] ENDOSCOPY;  Service: Endoscopy;  Laterality: N/A;   Lumpectomy 07-2000  2002   Port a cath Insertion  2001  PORT-A-CATH REMOVAL  05-2000   TONSILLECTOMY     WOUND DEBRIDEMENT Right 11/11/2021   Procedure: DEBRIDEMENT WOUND;  Surgeon: Silva Juliene SAUNDERS, DPM;  Location: [facility] OR;  Service: Podiatry;  Laterality: Right;    Allergies  Allergen Reactions   Latex Itching   Levofloxacin Other (See Comments)    Possible tendon rupture   Morphine And Codeine Nausea Only    Current Outpatient Medications  Medication Sig Dispense Refill   amitriptyline  (ELAVIL ) 75 MG tablet Take 75 mg by mouth at bedtime.     amoxicillin -clavulanate (AUGMENTIN ) 875-125 MG tablet Take 1 tablet by mouth 2 (two) times daily. 20 tablet 0   aspirin  EC 81 MG tablet Take 81 mg by mouth daily. Swallow whole.     atorvastatin  (LIPITOR) 20 MG tablet Take 20 mg by mouth at bedtime.     calcium  carbonate (OS-CAL) 600 MG TABS Take 600 mg by mouth 2 (two) times daily with a meal. Vitamin D 400 mg     Cholecalciferol (VITAMIN D3 PO) Take 5,000 Units by  mouth at bedtime.     clopidogrel  (PLAVIX ) 75 MG tablet TAKE 1 TABLET EVERY DAY 90 tablet 3   Coenzyme Q10 (COQ10 PO) Take 200 mg by mouth daily.     diphenoxylate-atropine (LOMOTIL) 2.5-0.025 MG tablet Take 1 tablet by mouth every 6 (six) hours.     fluticasone (FLONASE) 50 MCG/ACT nasal spray Place 1 spray into both nostrils daily as needed for allergies or rhinitis.     furosemide  (LASIX ) 40 MG tablet Take 1 tablet (40 mg total) by mouth daily. 30 tablet 1   losartan  (COZAAR ) 100 MG tablet Take 1 tablet (100 mg total) by mouth daily. 90 tablet 1   MAGNESIUM  PO Take 400 mg by mouth at bedtime.     montelukast (SINGULAIR) 10 MG tablet Take 10 mg by mouth daily.     Multiple Vitamin (MULTIVITAMIN) tablet Take 1 tablet by mouth daily.     mupirocin  ointment (BACTROBAN ) 2 % Apply thin layer to wound on top of right foot and cover with a Band-Aid.  Change once daily. 22 g 1   pantoprazole  (PROTONIX ) 40 MG tablet Take 40 mg by mouth daily.     promethazine  (PHENERGAN ) 25 MG tablet Take 25 mg by mouth every 6 (six) hours as needed for nausea/vomiting.     raloxifene  (EVISTA ) 60 MG tablet Take 60 mg by mouth daily.     senna-docusate (SENOKOT-S) 8.6-50 MG tablet Take 2 tablets by mouth at bedtime.     No current facility-administered medications for this visit.    No family history on file.  Social History   Socioeconomic History   Marital status: Widowed    Spouse name: Not on file   Number of children: Not on file   Years of education: Not on file   Highest education level: Not on file  Occupational History   Not on file  Tobacco Use   Smoking status: Never   Smokeless tobacco: Never  Vaping Use   Vaping status: Not on file  Substance and Sexual Activity   Alcohol use: No   Drug use: No   Sexual activity: Not Currently    Birth control/protection: Surgical    Comment: hysterectomy  Other Topics Concern   Not on file  Social History Narrative   Not on file   Social Drivers  of Health   Financial Resource Strain: Not on file  Food Insecurity: Not on file  Transportation Needs:  Not on file  Physical Activity: Not on file  Stress: Not on file  Social Connections: Unknown (10/17/2021)   Received [facility]   Social Network    Social Network: Not on file  Intimate Partner Violence: Unknown (09/08/2021)   Received from [facility]   HITS    Physically Hurt: Not on file    Insult or Talk Down To: Not on file    Threaten Physical Harm: Not on file    Scream or Curse: Not on file     REVIEW OF SYSTEMS:   [X]  denotes positive finding, [ ]  denotes negative finding Cardiac  Comments:  Chest pain or chest pressure:    Shortness of breath upon exertion:    Short of breath when lying flat:    Irregular heart rhythm:        Vascular    Pain in calf, thigh, or hip brought on by ambulation:    Pain in feet at night that wakes you up from your sleep:     Blood clot in your veins:    Leg swelling:  x       Pulmonary    Oxygen at home:    Productive cough:     Wheezing:         Neurologic    Sudden weakness in arms or legs:     Sudden numbness in arms or legs:     Sudden onset of difficulty speaking or slurred speech:    Temporary loss of vision in one eye:     Problems with dizziness:         Gastrointestinal    Blood in stool:     Vomited blood:         Genitourinary    Burning when urinating:     Blood in urine:        Psychiatric    Major depression:         Hematologic    Bleeding problems:    Problems with blood clotting too easily:        Skin    Rashes or ulcers:        Constitutional    Fever or chills:      PHYSICAL EXAMINATION:  Today's Vitals   02/08/24 0846  BP: 127/81  Pulse: 90  Temp: 97.7 F (36.5 C)  TempSrc: Temporal  PainSc: 0-No pain   There is no height or weight on file to calculate BMI.   General:  WDWN in NAD; vital signs documented above Gait: Not observed HENT: WNL,  normocephalic Pulmonary: normal non-labored breathing , without wheezing Cardiac: regular HR, without carotid bruits Abdomen: soft, NT; aortic pulse is not palpable Skin: without rashes Vascular Exam/Pulses:  Right Left  Radial 2+ (normal) 2+ (normal)  DP 2+ (normal) 2+ (normal)   Extremities: without ischemic changes, without Gangrene , without cellulitis; without open wounds; mild BLE swelling with skin color changes bilaterally.  She does have superficial spider veins bilaterally Musculoskeletal: no muscle wasting or atrophy  Neurologic: A&O X 3 Psychiatric:  The pt has Normal affect.   Non-Invasive Vascular Imaging:   ABI's/TBI's on 01/04/2024: Right:  1.13/0.43 - Great toe pressure: 61 Left:  1.06/0.35 - Great toe pressure: 50  Arterial duplex on 01/04/2024: +-----------+--------+-----+---------------+---------+--------+  RIGHT     PSV cm/sRatioStenosis       Waveform Comments  +-----------+--------+-----+---------------+---------+--------+  CFA Distal 159          30-49% stenosistriphasic          +-----------+--------+-----+---------------+---------+--------+  DFA       50                          triphasic          +-----------+--------+-----+---------------+---------+--------+  SFA Prox   120                         triphasic          +-----------+--------+-----+---------------+---------+--------+  SFA Mid    147                         triphasic          +-----------+--------+-----+---------------+---------+--------+  SFA Distal 98                          biphasic           +-----------+--------+-----+---------------+---------+--------+  POP Prox   115                         triphasic          +-----------+--------+-----+---------------+---------+--------+  POP Mid    71                          triphasic          +-----------+--------+-----+---------------+---------+--------+  ATA Distal 62                           biphasic           +-----------+--------+-----+---------------+---------+--------+  PTA Distal 83                          biphasic           +-----------+--------+-----+---------------+---------+--------+  PERO Distal50                          biphasic           +-----------+--------+-----+---------------+---------+--------+   Right Stent(s):  +---------------+--------+--------+---------+--------+  SFA PT-MT      PSV cm/sStenosisWaveform Comments  +---------------+--------+--------+---------+--------+  Prox to Stent  122             triphasic          +---------------+--------+--------+---------+--------+  Proximal Stent 148             triphasic          +---------------+--------+--------+---------+--------+  Mid Stent      147             triphasic          +---------------+--------+--------+---------+--------+  Distal Stent   132             triphasic          +---------------+--------+--------+---------+--------+  Distal to [id number]             triphasic          +---------------+--------+--------+---------+--------+    Summary:  Right: 30-49% stenosis noted in the common femoral artery. Patent stent with no evidence of stenosis in the superficial femoral artery artery. No significant change compared to previous study   Previous ABI's/TBI's on 12/16/2022: Right:  1.08/0.46 - Great toe pressure: 65 Left:  1.27/0.40 - Great toe pressure:  57  Previous arterial duplex on  12/16/2022: +-----------+--------+-----+--------+----------+--------+  RIGHT     PSV cm/sRatioStenosisWaveform  Comments  +-----------+--------+-----+--------+----------+--------+  CFA Prox   153                  triphasic           +-----------+--------+-----+--------+----------+--------+  DFA       100                  monophasic          +-----------+--------+-----+--------+----------+--------+  SFA Prox   127                   triphasic           +-----------+--------+-----+--------+----------+--------+  SFA Mid    116                  triphasic           +-----------+--------+-----+--------+----------+--------+  SFA Distal 83                   triphasic           +-----------+--------+-----+--------+----------+--------+  POP Distal 113                  triphasic           +-----------+--------+-----+--------+----------+--------+  TP Trunk   65                   triphasic           +-----------+--------+-----+--------+----------+--------+  ATA Distal 47                   triphasic           +-----------+--------+-----+--------+----------+--------+  PTA Distal 66                   triphasic           +-----------+--------+-----+--------+----------+--------+  PERO Distal47                   triphasic           +-----------+--------+-----+--------+----------+--------+    Right Stent(s):  +---------------+--------+--------+---------+--------+  Prox SFA       PSV cm/sStenosisWaveform Comments  +---------------+--------+--------+---------+--------+  Prox to Stent  158             triphasic          +---------------+--------+--------+---------+--------+  Proximal Stent 144             triphasic          +---------------+--------+--------+---------+--------+  Mid Stent      116             triphasic          +---------------+--------+--------+---------+--------+  Distal Stent   159             triphasic          +---------------+--------+--------+---------+--------+  Distal to Stent114             triphasic          +---------------+--------+--------+---------+--------+   Summary:  Right: Patent stent with no evidence of stenosis in the superficial  femoral artery artery.      ASSESSMENT/PLAN:: 73 y.o. female here for follow up for PAD with hx of  angiogram with drug eluting stent of the right SFA 08/25/2021 by Dr.  Lanis for CLI.  She had gangrene of the right 3rd toe and underwent toe amputation by Dr. Silva 11/11/2021.     -  pt doing well with palpable DP pulses bilaterally.  She did have a couple of areas on the dorsum of the right foot that have healed.  Her RLE arterial duplex and ABI is unchanged from a year ago  -continue asa/statin/plavix  -discussed importance of increased walking daily and encouraged her that when she is standing for prolonged periods of time, to walk during that time.  Discussed building up collaterals.  Discussed protecting her feet -encouraged her to continue wearing her compression hose and elevating her legs.  -pt will f/u in one year with RLE arterial duplex and ABI.  She will call sooner if any issues before then.  She will f/u with her podiatrist as well.    Lucie Apt, [facility] [phone]  Clinic MD:   Lanis

## 2024-02-08 ENCOUNTER — Ambulatory Visit: Attending: Vascular Surgery | Admitting: Physician Assistant

## 2024-02-08 ENCOUNTER — Encounter: Payer: Self-pay | Admitting: Physician Assistant

## 2024-02-08 VITALS — BP 127/81 | HR 90 | Temp 97.7°F

## 2024-02-08 DIAGNOSIS — I739 Peripheral vascular disease, unspecified: Secondary | ICD-10-CM
